# Patient Record
Sex: Male | Born: 1980 | Race: White | Hispanic: No | Marital: Married | Smoking: Former smoker
Health system: Southern US, Community
[De-identification: ages and names within clinical notes are randomized; demographics above are authoritative.]

## PROBLEM LIST (undated history)

## (undated) DIAGNOSIS — I10 Essential (primary) hypertension: Secondary | ICD-10-CM

## (undated) DIAGNOSIS — E669 Obesity, unspecified: Secondary | ICD-10-CM

## (undated) DIAGNOSIS — M1990 Unspecified osteoarthritis, unspecified site: Secondary | ICD-10-CM

## (undated) DIAGNOSIS — F419 Anxiety disorder, unspecified: Secondary | ICD-10-CM

## (undated) DIAGNOSIS — F329 Major depressive disorder, single episode, unspecified: Secondary | ICD-10-CM

## (undated) DIAGNOSIS — Z87442 Personal history of urinary calculi: Secondary | ICD-10-CM

## (undated) DIAGNOSIS — R0781 Pleurodynia: Secondary | ICD-10-CM

## (undated) DIAGNOSIS — E119 Type 2 diabetes mellitus without complications: Secondary | ICD-10-CM

## (undated) DIAGNOSIS — E785 Hyperlipidemia, unspecified: Secondary | ICD-10-CM

## (undated) DIAGNOSIS — I2510 Atherosclerotic heart disease of native coronary artery without angina pectoris: Secondary | ICD-10-CM

## (undated) DIAGNOSIS — K861 Other chronic pancreatitis: Secondary | ICD-10-CM

## (undated) DIAGNOSIS — N35919 Unspecified urethral stricture, male, unspecified site: Secondary | ICD-10-CM

## (undated) DIAGNOSIS — F32A Depression, unspecified: Secondary | ICD-10-CM

## (undated) DIAGNOSIS — E781 Pure hyperglyceridemia: Secondary | ICD-10-CM

## (undated) DIAGNOSIS — Z9189 Other specified personal risk factors, not elsewhere classified: Secondary | ICD-10-CM

## (undated) HISTORY — PX: APPENDECTOMY: SHX54

## (undated) HISTORY — PX: CARDIAC CATHETERIZATION: SHX172

## (undated) HISTORY — PX: FRACTURE SURGERY: SHX138

---
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
## 2004-08-25 ENCOUNTER — Emergency Department (HOSPITAL_COMMUNITY): Admission: EM | Admit: 2004-08-25 | Discharge: 2004-08-25 | Payer: Self-pay | Admitting: Emergency Medicine

## 2005-01-07 ENCOUNTER — Emergency Department (HOSPITAL_COMMUNITY): Admission: EM | Admit: 2005-01-07 | Discharge: 2005-01-07 | Payer: Self-pay | Admitting: Emergency Medicine

## 2006-06-14 ENCOUNTER — Emergency Department (HOSPITAL_COMMUNITY): Admission: EM | Admit: 2006-06-14 | Discharge: 2006-06-14 | Payer: Self-pay | Admitting: Family Medicine

## 2014-01-30 HISTORY — PX: URETHROPLASTY: SHX499

## 2014-07-24 ENCOUNTER — Other Ambulatory Visit: Payer: Self-pay | Admitting: Urology

## 2014-08-05 ENCOUNTER — Encounter (HOSPITAL_BASED_OUTPATIENT_CLINIC_OR_DEPARTMENT_OTHER): Payer: Self-pay | Admitting: *Deleted

## 2014-08-05 NOTE — Progress Notes (Signed)
NPO AFTER MN.  ARRIVE AT 1000.  NEEDS ISTAT AND EKG.  WILL  DO HIBICLENS SHOWER HS BEFORE DOS.

## 2014-08-05 NOTE — Progress Notes (Signed)
   08/05/14 1631  OBSTRUCTIVE SLEEP APNEA  Have you ever been diagnosed with sleep apnea through a sleep study? No  Do you snore loudly (loud enough to be heard through closed doors)?  1  Do you often feel tired, fatigued, or sleepy during the daytime? 0  Has anyone observed you stop breathing during your sleep? 1  Do you have, or are you being treated for high blood pressure? 1  BMI more than 35 kg/m2? 1  Age over 34 years old? 0  Neck circumference greater than 40 cm/16 inches? 1  Gender: 1  Obstructive Sleep Apnea Score 6

## 2014-08-09 ENCOUNTER — Encounter (HOSPITAL_BASED_OUTPATIENT_CLINIC_OR_DEPARTMENT_OTHER): Payer: Self-pay | Admitting: Anesthesiology

## 2014-08-09 NOTE — Anesthesia Preprocedure Evaluation (Addendum)
Anesthesia Evaluation  Patient identified by MRN, date of birth, ID band Patient awake    Reviewed: Allergy & Precautions, H&P , NPO status , Patient's Chart, lab work & pertinent test results, reviewed documented beta blocker date and time , Unable to perform ROS - Chart review only  History of Anesthesia Complications Negative for: history of anesthetic complications  Airway Mallampati: IV  TM Distance: >3 FB Neck ROM: Limited  Mouth opening: Limited Mouth Opening Comment: Patient is obese with very thick neck Dental no notable dental hx.    Pulmonary former smoker,  breath sounds clear to auscultation  Pulmonary exam normal       Cardiovascular hypertension, Normal cardiovascular examRhythm:regular Rate:Normal     Neuro/Psych negative neurological ROS  negative psych ROS   GI/Hepatic negative GI ROS, Neg liver ROS,   Endo/Other  negative endocrine ROS  Renal/GU negative Renal ROS     Musculoskeletal   Abdominal (+) + obese,   Peds  Hematology negative hematology ROS (+)   Anesthesia Other Findings NPO appropriate, no meds today, allergies reviewed Denies active cardiac or pulmonary symptoms, METS > 4 No recent congestive cough or symptoms of upper respiratory infection - no labs - will have video laryngoscope available in room as needed  Reproductive/Obstetrics negative OB ROS                          Anesthesia Physical Anesthesia Plan  ASA: II  Anesthesia Plan: General   Post-op Pain Management:    Induction: Intravenous  Airway Management Planned: LMA  Additional Equipment:   Intra-op Plan:   Post-operative Plan: Extubation in OR  Informed Consent: I have reviewed the patients History and Physical, chart, labs and discussed the procedure including the risks, benefits and alternatives for the proposed anesthesia with the patient or authorized representative who has  indicated his/her understanding and acceptance.   Dental Advisory Given  Plan Discussed with: Anesthesiologist  Anesthesia Plan Comments:         Anesthesia Quick Evaluation

## 2014-08-10 ENCOUNTER — Ambulatory Visit (HOSPITAL_BASED_OUTPATIENT_CLINIC_OR_DEPARTMENT_OTHER): Payer: 59 | Admitting: Anesthesiology

## 2014-08-10 ENCOUNTER — Ambulatory Visit (HOSPITAL_BASED_OUTPATIENT_CLINIC_OR_DEPARTMENT_OTHER)
Admission: RE | Admit: 2014-08-10 | Discharge: 2014-08-10 | Disposition: A | Discharge status: Home / Self Care | Payer: 59 | Source: Ambulatory Visit | Attending: Urology | Admitting: Urology

## 2014-08-10 ENCOUNTER — Encounter (HOSPITAL_BASED_OUTPATIENT_CLINIC_OR_DEPARTMENT_OTHER)
Admission: RE | Disposition: A | Discharge status: Home / Self Care | Payer: Self-pay | Source: Ambulatory Visit | Attending: Urology

## 2014-08-10 ENCOUNTER — Encounter (HOSPITAL_BASED_OUTPATIENT_CLINIC_OR_DEPARTMENT_OTHER): Payer: Self-pay | Admitting: *Deleted

## 2014-08-10 DIAGNOSIS — I10 Essential (primary) hypertension: Secondary | ICD-10-CM | POA: Diagnosis not present

## 2014-08-10 DIAGNOSIS — Z6838 Body mass index (BMI) 38.0-38.9, adult: Secondary | ICD-10-CM | POA: Insufficient documentation

## 2014-08-10 DIAGNOSIS — E669 Obesity, unspecified: Secondary | ICD-10-CM | POA: Diagnosis not present

## 2014-08-10 DIAGNOSIS — N359 Urethral stricture, unspecified: Secondary | ICD-10-CM | POA: Insufficient documentation

## 2014-08-10 DIAGNOSIS — N480 Leukoplakia of penis: Secondary | ICD-10-CM | POA: Insufficient documentation

## 2014-08-10 HISTORY — PX: CYSTOSCOPY WITH URETHRAL DILATATION: SHX5125

## 2014-08-10 HISTORY — DX: Hyperlipidemia, unspecified: E78.5

## 2014-08-10 HISTORY — PX: PENILE BIOPSY: SHX6013

## 2014-08-10 HISTORY — DX: Essential (primary) hypertension: I10

## 2014-08-10 HISTORY — DX: Other specified personal risk factors, not elsewhere classified: Z91.89

## 2014-08-10 HISTORY — DX: Unspecified osteoarthritis, unspecified site: M19.90

## 2014-08-10 HISTORY — DX: Unspecified urethral stricture, male, unspecified site: N35.919

## 2014-08-10 LAB — POCT I-STAT 4, (NA,K, GLUC, HGB,HCT)
Glucose, Bld: 159 mg/dL — ABNORMAL HIGH (ref 65–99)
HCT: 41 % (ref 39.0–52.0)
Hemoglobin: 13.9 g/dL (ref 13.0–17.0)
Potassium: 4.3 mmol/L (ref 3.5–5.1)
Sodium: 138 mmol/L (ref 135–145)

## 2014-08-10 SURGERY — CYSTOSCOPY, WITH URETHRAL DILATION
Anesthesia: General | Site: Penis

## 2014-08-10 MED ORDER — LACTATED RINGERS IV SOLN
INTRAVENOUS | Status: DC
  Administered 2014-08-10 (×2): via INTRAVENOUS
  Filled 2014-08-10: qty 1000

## 2014-08-10 MED ORDER — OXYCODONE-ACETAMINOPHEN 5-325 MG PO TABS
ORAL_TABLET | ORAL | Status: AC
  Filled 2014-08-10: qty 1

## 2014-08-10 MED ORDER — FENTANYL CITRATE (PF) 100 MCG/2ML IJ SOLN
INTRAMUSCULAR | Status: AC
  Filled 2014-08-10: qty 2

## 2014-08-10 MED ORDER — MIDAZOLAM HCL 5 MG/5ML IJ SOLN
INTRAMUSCULAR | Status: DC | PRN
  Administered 2014-08-10: 2 mg via INTRAVENOUS

## 2014-08-10 MED ORDER — CEFAZOLIN SODIUM-DEXTROSE 2-3 GM-% IV SOLR
2.0000 g | INTRAVENOUS | Status: AC
  Administered 2014-08-10: 2 g via INTRAVENOUS
  Filled 2014-08-10: qty 50

## 2014-08-10 MED ORDER — FENTANYL CITRATE (PF) 100 MCG/2ML IJ SOLN
INTRAMUSCULAR | Status: AC
  Filled 2014-08-10: qty 6

## 2014-08-10 MED ORDER — LIDOCAINE HCL (CARDIAC) 20 MG/ML IV SOLN
INTRAVENOUS | Status: DC | PRN
  Administered 2014-08-10: 100 mg via INTRAVENOUS

## 2014-08-10 MED ORDER — CEFAZOLIN SODIUM 1-5 GM-% IV SOLN
1.0000 g | INTRAVENOUS | Status: DC
  Filled 2014-08-10: qty 50

## 2014-08-10 MED ORDER — DEXAMETHASONE SODIUM PHOSPHATE 4 MG/ML IJ SOLN
INTRAMUSCULAR | Status: DC | PRN
  Administered 2014-08-10: 10 mg via INTRAVENOUS

## 2014-08-10 MED ORDER — FENTANYL CITRATE (PF) 100 MCG/2ML IJ SOLN
INTRAMUSCULAR | Status: DC | PRN
  Administered 2014-08-10: 50 ug via INTRAVENOUS
  Administered 2014-08-10: 25 ug via INTRAVENOUS
  Administered 2014-08-10: 50 ug via INTRAVENOUS
  Administered 2014-08-10: 25 ug via INTRAVENOUS
  Administered 2014-08-10: 50 ug via INTRAVENOUS

## 2014-08-10 MED ORDER — FENTANYL CITRATE (PF) 100 MCG/2ML IJ SOLN
INTRAMUSCULAR | Status: AC
  Filled 2014-08-10: qty 4

## 2014-08-10 MED ORDER — PROMETHAZINE HCL 25 MG/ML IJ SOLN
6.2500 mg | INTRAMUSCULAR | Status: DC | PRN
  Filled 2014-08-10: qty 1

## 2014-08-10 MED ORDER — OXYCODONE-ACETAMINOPHEN 5-325 MG PO TABS
1.0000 | ORAL_TABLET | ORAL | Status: AC | PRN
  Administered 2014-08-10: 1 via ORAL
  Filled 2014-08-10: qty 2

## 2014-08-10 MED ORDER — FENTANYL CITRATE (PF) 100 MCG/2ML IJ SOLN
25.0000 ug | INTRAMUSCULAR | Status: DC | PRN
  Administered 2014-08-10: 50 ug via INTRAVENOUS
  Administered 2014-08-10: 25 ug via INTRAVENOUS
  Filled 2014-08-10: qty 1

## 2014-08-10 MED ORDER — CHLORHEXIDINE GLUCONATE 4 % EX LIQD
Freq: Once | CUTANEOUS | Status: DC
  Filled 2014-08-10: qty 15

## 2014-08-10 MED ORDER — SODIUM CHLORIDE 0.9 % IR SOLN
Status: DC | PRN
  Administered 2014-08-10: 500 mL

## 2014-08-10 MED ORDER — STERILE WATER FOR IRRIGATION IR SOLN
Status: DC | PRN
  Administered 2014-08-10: 3000 mL

## 2014-08-10 MED ORDER — MIDAZOLAM HCL 2 MG/2ML IJ SOLN
INTRAMUSCULAR | Status: AC
  Filled 2014-08-10: qty 2

## 2014-08-10 MED ORDER — ONDANSETRON HCL 4 MG/2ML IJ SOLN
INTRAMUSCULAR | Status: DC | PRN
  Administered 2014-08-10: 4 mg via INTRAVENOUS

## 2014-08-10 MED ORDER — OXYCODONE-ACETAMINOPHEN 5-325 MG PO TABS: 1.0000 | ORAL_TABLET | ORAL | Status: DC | PRN

## 2014-08-10 MED ORDER — PROPOFOL 10 MG/ML IV BOLUS
INTRAVENOUS | Status: DC | PRN
  Administered 2014-08-10: 300 mg via INTRAVENOUS

## 2014-08-10 MED ORDER — CEFAZOLIN SODIUM-DEXTROSE 2-3 GM-% IV SOLR
INTRAVENOUS | Status: AC
  Filled 2014-08-10: qty 50

## 2014-08-10 SURGICAL SUPPLY — 18 items
BAG DRAIN URO-CYSTO SKYTR STRL (DRAIN) ×2 IMPLANT
BAG DRN UROCATH (DRAIN) ×1
BAG URINE DRAINAGE (UROLOGICAL SUPPLIES) ×2 IMPLANT
CANISTER SUCT LVC 12 LTR MEDI- (MISCELLANEOUS) ×2 IMPLANT
CATH SILICONE 5CC 18FR (INSTRUMENTS) ×2 IMPLANT
CLOTH BEACON ORANGE TIMEOUT ST (SAFETY) ×3 IMPLANT
GLOVE BIO SURGEON STRL SZ 6.5 (GLOVE) ×1 IMPLANT
GLOVE BIO SURGEON STRL SZ8 (GLOVE) ×3 IMPLANT
GLOVE BIO SURGEONS STRL SZ 6.5 (GLOVE) ×1
GLOVE BIOGEL PI IND STRL 6.5 (GLOVE) IMPLANT
GLOVE BIOGEL PI INDICATOR 6.5 (GLOVE) ×2
GOWN STRL REUS W/ TWL XL LVL3 (GOWN DISPOSABLE) ×1 IMPLANT
GOWN STRL REUS W/TWL XL LVL3 (GOWN DISPOSABLE) ×6
HOLDER FOLEY CATH W/STRAP (MISCELLANEOUS) ×2 IMPLANT
NS IRRIG 500ML POUR BTL (IV SOLUTION) ×2 IMPLANT
PACK CYSTO (CUSTOM PROCEDURE TRAY) ×3 IMPLANT
SUT CHROMIC 3 0 SH 27 (SUTURE) ×2 IMPLANT
WATER STERILE IRR 3000ML UROMA (IV SOLUTION) ×3 IMPLANT

## 2014-08-10 NOTE — Discharge Instructions (Signed)
CYSTOSCOPY HOME CARE INSTRUCTIONS ° °Activity: °Rest for the remainder of the day.  Do not drive or operate equipment today.  You may resume normal activities in one to two days as instructed by your physician.  ° °Meals: °Drink plenty of liquids and eat light foods such as gelatin or soup this evening.  You may return to a normal meal plan tomorrow. ° °Return to Work: °You may return to work in one to two days or as instructed by your physician. ° °Special Instructions / Symptoms: °Call your physician if any of these symptoms occur: ° ° -persistent or heavy bleeding ° -bleeding which continues after first few urination ° -large blood clots that are difficult to pass ° -urine stream diminishes or stops completely ° -fever equal to or higher than 101 degrees Farenheit. ° -cloudy urine with a strong, foul odor ° -severe pain ° °Females should always wipe from front to back after elimination.  You may feel some burning pain when you urinate.  This should disappear with time.  Applying moist heat to the lower abdomen or a hot tub bath may help relieve the pain.  ° °Follow-Up / Date of Return Visit to Your Physician: Call for an appointment to arrange follow-up. ° ° ° °Foley Catheter Care °A Foley catheter is a soft, flexible tube that is placed into the bladder to drain urine. A Foley catheter may be inserted if: °· You leak urine or are not able to control when you urinate (urinary incontinence). °· You are not able to urinate when you need to (urinary retention). °· You had prostate surgery or surgery on the genitals. °· You have certain medical conditions, such as multiple sclerosis, dementia, or a spinal cord injury. °If you are going home with a Foley catheter in place, follow the instructions below. °TAKING CARE OF THE CATHETER °1. Wash your hands with soap and water. °2. Using mild soap and warm water on a clean washcloth: °· Clean the area on your body closest to the catheter insertion site using a circular  motion, moving away from the catheter. Never wipe toward the catheter because this could sweep bacteria up into the urethra and cause infection. °· Remove all traces of soap. Pat the area dry with a clean towel. For males, reposition the foreskin. °3. Attach the catheter to your leg so there is no tension on the catheter. Use adhesive tape or a leg strap. If you are using adhesive tape, remove any sticky residue left behind by the previous tape you used. °4. Keep the drainage bag below the level of the bladder, but keep it off the floor. °5. Check throughout the day to be sure the catheter is working and urine is draining freely. Make sure the tubing does not become kinked. °6. Do not pull on the catheter or try to remove it. Pulling could damage internal tissues. °TAKING CARE OF THE DRAINAGE BAGS °You will be given two drainage bags to take home. One is a large overnight drainage bag, and the other is a smaller leg bag that fits underneath clothing. You may wear the overnight bag at any time, but you should never wear the smaller leg bag at night. Follow the instructions below for how to empty, change, and clean your drainage bags. °Emptying the Drainage Bag °You must empty your drainage bag when it is  -½ full or at least 2-3 times a day. °1. Wash your hands with soap and water. °2. Keep the drainage bag below   your hips, below the level of your bladder. This stops urine from going back into the tubing and into your bladder. °3. Hold the dirty bag over the toilet or a clean container. °4. Open the pour spout at the bottom of the bag and empty the urine into the toilet or container. Do not let the pour spout touch the toilet, container, or any other surface. Doing so can place bacteria on the bag, which can cause an infection. °5. Clean the pour spout with a gauze pad or cotton ball that has rubbing alcohol on it. °6. Close the pour spout. °7. Attach the bag to your leg with adhesive tape or a leg strap. °8. Wash  your hands well. °Changing the Drainage Bag °Change your drainage bag once a month or sooner if it starts to smell bad or look dirty. Below are steps to follow when changing the drainage bag. °1. Wash your hands with soap and water. °2. Pinch off the rubber catheter so that urine does not spill out. °3. Disconnect the catheter tube from the drainage tube at the connection valve. Do not let the tubes touch any surface. °4. Clean the end of the catheter tube with an alcohol wipe. Use a different alcohol wipe to clean the end of the drainage tube. °5. Connect the catheter tube to the drainage tube of the clean drainage bag. °6. Attach the new bag to the leg with adhesive tape or a leg strap. Avoid attaching the new bag too tightly. °7. Wash your hands well. °Cleaning the Drainage Bag °1. Wash your hands with soap and water. °2. Wash the bag in warm, soapy water. °3. Rinse the bag thoroughly with warm water. °4. Fill the bag with a solution of white vinegar and water (1 cup vinegar to 1 qt warm water [.2 L vinegar to 1 L warm water]). Close the bag and soak it for 30 minutes in the solution. °5. Rinse the bag with warm water. °6. Hang the bag to dry with the pour spout open and hanging downward. °7. Store the clean bag (once it is dry) in a clean plastic bag. °8. Wash your hands well. °PREVENTING INFECTION °· Wash your hands before and after handling your catheter. °· Take showers daily and wash the area where the catheter enters your body. Do not take baths. Replace wet leg straps with dry ones, if this applies. °· Do not use powders, sprays, or lotions on the genital area. Only use creams, lotions, or ointments as directed by your caregiver. °· For females, wipe from front to back after each bowel movement. °· Drink enough fluids to keep your urine clear or pale yellow unless you have a fluid restriction. °· Do not let the drainage bag or tubing touch or lie on the floor. °· Wear cotton underwear to absorb moisture  and to keep your skin drier. °SEEK MEDICAL CARE IF:  °· Your urine is cloudy or smells unusually bad. °· Your catheter becomes clogged. °· You are not draining urine into the bag or your bladder feels full. °· Your catheter starts to leak. °SEEK IMMEDIATE MEDICAL CARE IF:  °· You have pain, swelling, redness, or pus where the catheter enters the body. °· You have pain in the abdomen, legs, lower back, or bladder. °· You have a fever. °· You see blood fill the catheter, or your urine is pink or red. °· You have nausea, vomiting, or chills. °· Your catheter gets pulled out. °MAKE SURE YOU:  °·   Understand these instructions. °· Will watch your condition. °· Will get help right away if you are not doing well or get worse. °Document Released: 01/16/2005 Document Revised: 06/02/2013 Document Reviewed: 01/08/2012 °[employer]® Patient Information ©2015 [employer]. This information is not intended to replace advice given to you by your health care provider. Make sure you discuss any questions you have with your health care provider. ° ° ° ° ° °Post Anesthesia Home Care Instructions ° °Activity: °Get plenty of rest for the remainder of the day. A responsible adult should stay with you for 24 hours following the procedure.  °For the next 24 hours, DO NOT: °-Drive a car °-Operate machinery °-Drink alcoholic beverages °-Take any medication unless instructed by your physician °-Make any legal decisions or sign important papers. ° °Meals: °Start with liquid foods such as gelatin or soup. Progress to regular foods as tolerated. Avoid greasy, spicy, heavy foods. If nausea and/or vomiting occur, drink only clear liquids until the nausea and/or vomiting subsides. Call your physician if vomiting continues. ° °Special Instructions/Symptoms: °Your throat may feel dry or sore from the anesthesia or the breathing tube placed in your throat during surgery. If this causes discomfort, gargle with warm salt water. The discomfort should  disappear within 24 hours. ° °If you had a scopolamine patch placed behind your ear for the management of post- operative nausea and/or vomiting: ° °1. The medication in the patch is effective for 72 hours, after which it should be removed.  Wrap patch in a tissue and discard in the trash. Wash hands thoroughly with soap and water. °2. You may remove the patch earlier than 72 hours if you experience unpleasant side effects which may include dry mouth, dizziness or visual disturbances. °3. Avoid touching the patch. Wash your hands with soap and water after contact with the patch. °  ° °

## 2014-08-10 NOTE — Brief Op Note (Signed)
08/10/2014  12:05 PM  PATIENT:  Andre RuizJohn T Ford  34 y.o. male  PRE-OPERATIVE DIAGNOSIS:  URETHRAL STRICTURE  POST-OPERATIVE DIAGNOSIS:  URETHRAL STRICTURE  PROCEDURE:  Procedure(s) with comments: CYSTOSCOPY WITH URETHRAL DILATATION (N/A) - **CATHETER PLACEMENT**  Glans Punch biopsy  SURGEON:  Surgeon(s) and Role:    * Malen GauzePatrick L Anaysha Andre, MD - Primary  PHYSICIAN ASSISTANT:   ASSISTANTS: none   ANESTHESIA:   general  EBL:  Total I/O In: 1000 [I.V.:1000] Out: -   BLOOD ADMINISTERED:none  DRAINS: Urinary Catheter (Foley)   LOCAL MEDICATIONS USED:  NONE  SPECIMEN:  Glans Biopsy  DISPOSITION OF SPECIMEN:  PATHOLOGY  COUNTS:  YES  TOURNIQUET:  * No tourniquets in log *  DICTATION: .Note written in EPIC  PLAN OF CARE: Discharge to home after PACU  PATIENT DISPOSITION:  PACU - hemodynamically stable.   Delay start of Pharmacological VTE agent (>24hrs) due to surgical blood loss or risk of bleeding: not applicable

## 2014-08-10 NOTE — Anesthesia Procedure Notes (Signed)
Procedure Name: LMA Insertion Date/Time: 08/10/2014 11:37 AM Performed by: Renella CunasHAZEL, Mistee Soliman D Pre-anesthesia Checklist: Patient identified, Emergency Drugs available, Suction available and Patient being monitored Patient Re-evaluated:Patient Re-evaluated prior to inductionOxygen Delivery Method: Circle System Utilized Preoxygenation: Pre-oxygenation with 100% oxygen Intubation Type: IV induction Ventilation: Mask ventilation without difficulty LMA: LMA inserted LMA Size: 4.0 Number of attempts: 1 Airway Equipment and Method: Bite block Placement Confirmation: positive ETCO2 Tube secured with: Tape Dental Injury: Teeth and Oropharynx as per pre-operative assessment

## 2014-08-10 NOTE — Transfer of Care (Signed)
Immediate Anesthesia Transfer of Care Note  Patient: Andre Ford  Procedure(s) Performed: Procedure(s) (LRB): CYSTOSCOPY WITH URETHRAL DILATATION (N/A)  Patient Location: PACU  Anesthesia Type: General  Level of Consciousness: awake, oriented, sedated and patient cooperative  Airway & Oxygen Therapy: Patient Spontanous Breathing and Patient connected to face mask oxygen  Post-op Assessment: Report given to PACU RN and Post -op Vital signs reviewed and stable  Post vital signs: Reviewed and stable  Complications: No apparent anesthesia complications

## 2014-08-11 ENCOUNTER — Encounter (HOSPITAL_BASED_OUTPATIENT_CLINIC_OR_DEPARTMENT_OTHER): Payer: Self-pay | Admitting: Urology

## 2014-08-11 NOTE — Anesthesia Postprocedure Evaluation (Signed)
  Anesthesia Post-op Note  Patient: Andre Ford  Procedure(s) Performed: Procedure(s) (LRB): CYSTOSCOPY WITH URETHRAL DILATATION (N/A) Core biopsy of penile glands lesion (N/A)  Patient Location: PACU  Anesthesia Type: General  Level of Consciousness: awake and alert   Airway and Oxygen Therapy: Patient Spontanous Breathing  Post-op Pain: mild  Post-op Assessment: Post-op Vital signs reviewed, Patient's Cardiovascular Status Stable, Respiratory Function Stable, Patent Airway and No signs of Nausea or vomiting  Last Vitals:  Filed Vitals:   08/10/14 1348  BP: 123/73  Pulse: 69  Temp: 36.7 C  Resp: 16    Post-op Vital Signs: stable   Complications: No apparent anesthesia complications

## 2014-08-12 NOTE — H&P (Signed)
Urology Admission H&P  Chief Complaint: dysuria  History of Present Illness: Mr Blanche EastHathcock is a 34yo with a hx of significant LUTS who was diagnosed with a urethral stricture. He presents today for urethral dilation and possible biopsy  Past Medical History  Diagnosis Date  . Urethral stricture   . Hypertension   . Hyperlipidemia   . At risk for sleep apnea     STOP-BANG= 6     SENT TO [facility] 08-05-2014  . Arthritis    Past Surgical History  Procedure Laterality Date  . Appendectomy  age 89  . Orif right hand fx  2000  . Knee arthroscopy with anterior cruciate ligament (acl) repair Left 2000  . Cystoscopy with urethral dilatation N/A 08/10/2014    Procedure: CYSTOSCOPY WITH URETHRAL DILATATION;  Surgeon: Malen GauzePatrick L Lajuan Godbee, MD;  [facility] [place];  Service: Urology;  Laterality: N/A;  . Penile biopsy N/A 08/10/2014    Procedure: Core biopsy of penile glands lesion;  Surgeon: Malen GauzePatrick L Keyetta Hollingworth, MD;  [facility] [place];  Service: Urology;  Laterality: N/A;    Home Medications:  No prescriptions prior to admission   Allergies: No Known Allergies  History reviewed. No pertinent family history. Social History:  reports that he quit smoking about 3 years ago. His smoking use included Cigarettes. He quit after 10 years of use. He has never used smokeless tobacco. He reports that he drinks alcohol. He reports that he does not use illicit drugs.  Review of Systems  Genitourinary: Positive for dysuria, urgency and frequency.  All other systems reviewed and are negative.   Physical Exam:  Vital signs in last 24 hours:   Physical Exam  Constitutional: He is oriented to person, place, and time. He appears well-developed and well-nourished.  HENT:  Head: Normocephalic and atraumatic.  Eyes: EOM are normal. Pupils are equal, round, and reactive to light.  Neck: Normal range of motion. Neck supple.  Cardiovascular: Normal rate and regular rhythm.    Respiratory: Effort normal and breath sounds normal.  GI: Soft. He exhibits no distension. There is no tenderness.  Musculoskeletal: Normal range of motion.  Neurological: He is alert and oriented to person, place, and time.  Skin: Skin is warm and dry.  Psychiatric: He has a normal mood and affect. His behavior is normal. Judgment and thought content normal.    Laboratory Data:  No results found for this or any previous visit (from the past 24 hour(s)). No results found for this or any previous visit (from the past 240 hour(s)). Creatinine: No results for input(s): CREATININE in the last 168 hours.   Impression/Assessment:  Urethral stricture  Plan:  1. Risks/benefits/alternatives to cystoscopy with urethral dilation and biopsy was explained to the patient and he understands and wishes to proceed with surgery  Zeola Brys L 08/12/2014, 11:53 AM

## 2014-08-13 NOTE — Op Note (Signed)
Preoperative diagnosis: urethral stricture  Postoperative diagnosis: Same  Procedure: 1. Cystoscopy 2. urethral dilation  3. Biopsy of glans  Attending: Wilkie AyePatrick Paz Winsett  Anesthesia: General  History of blood loss: Minimal  Antibiotics: Ancef  Specimens: Glans biopsy  Drains: 18 French silicone catheter  Findings: dense urethral stricture involving entire penile and bulbar urethra.  No masses/lesions in the bladder.  Indications: Patient is a 34 year old male with a history of difficulty with urination and was found to have a urethral stricture and a lesion of the glans. After discussing treatment options the patient wishes to proceed with urethral dilation and biopsy of the glans.  Procedure in detail: Prior to procedure consent was obtained.  Patient was brought to the operating room and a brief timeout was done to ensure correct patient, correct procedure, correct site.  General anesthesia was administered and patient was placed in dorsal lithotomy position.  His genitalia was then prepped and draped in usual sterile fashion. Using the Hagar dilators the urethra was dilated to 20 french. We then passed a 17 French cystoscope into the urethra and then the bladder. We noted a panurethral stricture to the bulbar urethra.  We inspected the bladder and no lesions, masses, or calculi were noted. We then removed the cystoscope and placed an 18 French silicone catheter.  We then proceeded to obtain a punch biopsy of the glans due to the concern for malignancy. After obtaining the biopsy the defect was then closed with an interupted 3-0 chromic.This then concluded the procedure which was well tolerated by the patient.  Complications: None  Condition: Stable, extubated, transferred to PACU.  Plan: Patient is to be discharged home.  He is to follow up in 1 week for voiding trial.

## 2014-10-12 ENCOUNTER — Emergency Department (HOSPITAL_COMMUNITY): Payer: 59

## 2014-10-12 ENCOUNTER — Inpatient Hospital Stay (HOSPITAL_COMMUNITY)
Admission: EM | Admit: 2014-10-12 | Discharge: 2014-10-14 | DRG: 440 | Disposition: A | Discharge status: Home / Self Care | Payer: 59 | Attending: Internal Medicine | Admitting: Internal Medicine

## 2014-10-12 ENCOUNTER — Encounter (HOSPITAL_COMMUNITY): Payer: Self-pay | Admitting: *Deleted

## 2014-10-12 DIAGNOSIS — E782 Mixed hyperlipidemia: Secondary | ICD-10-CM

## 2014-10-12 DIAGNOSIS — Z87891 Personal history of nicotine dependence: Secondary | ICD-10-CM

## 2014-10-12 DIAGNOSIS — E1165 Type 2 diabetes mellitus with hyperglycemia: Secondary | ICD-10-CM | POA: Diagnosis present

## 2014-10-12 DIAGNOSIS — K859 Acute pancreatitis without necrosis or infection, unspecified: Secondary | ICD-10-CM | POA: Insufficient documentation

## 2014-10-12 DIAGNOSIS — I10 Essential (primary) hypertension: Secondary | ICD-10-CM | POA: Diagnosis present

## 2014-10-12 DIAGNOSIS — E876 Hypokalemia: Secondary | ICD-10-CM | POA: Diagnosis present

## 2014-10-12 DIAGNOSIS — E781 Pure hyperglyceridemia: Secondary | ICD-10-CM | POA: Diagnosis present

## 2014-10-12 DIAGNOSIS — F1010 Alcohol abuse, uncomplicated: Secondary | ICD-10-CM

## 2014-10-12 DIAGNOSIS — R1013 Epigastric pain: Secondary | ICD-10-CM | POA: Diagnosis present

## 2014-10-12 DIAGNOSIS — K858 Other acute pancreatitis: Secondary | ICD-10-CM | POA: Diagnosis not present

## 2014-10-12 DIAGNOSIS — Z6839 Body mass index (BMI) 39.0-39.9, adult: Secondary | ICD-10-CM

## 2014-10-12 DIAGNOSIS — E669 Obesity, unspecified: Secondary | ICD-10-CM

## 2014-10-12 DIAGNOSIS — K760 Fatty (change of) liver, not elsewhere classified: Secondary | ICD-10-CM | POA: Diagnosis present

## 2014-10-12 DIAGNOSIS — K850 Idiopathic acute pancreatitis: Secondary | ICD-10-CM | POA: Diagnosis not present

## 2014-10-12 DIAGNOSIS — K852 Alcohol induced acute pancreatitis: Principal | ICD-10-CM | POA: Diagnosis present

## 2014-10-12 HISTORY — DX: Obesity, unspecified: E66.9

## 2014-10-12 LAB — URINALYSIS, ROUTINE W REFLEX MICROSCOPIC
Glucose, UA: 500 mg/dL — AB
KETONES UR: 15 mg/dL — AB
Nitrite: NEGATIVE
Protein, ur: 100 mg/dL — AB
SPECIFIC GRAVITY, URINE: 1.039 — AB (ref 1.005–1.030)
UROBILINOGEN UA: 0.2 mg/dL (ref 0.0–1.0)
pH: 5 (ref 5.0–8.0)

## 2014-10-12 LAB — COMPREHENSIVE METABOLIC PANEL
ALBUMIN: 4.4 g/dL (ref 3.5–5.0)
ALT: 53 U/L (ref 17–63)
AST: 36 U/L (ref 15–41)
Alkaline Phosphatase: 107 U/L (ref 38–126)
Anion gap: 12 (ref 5–15)
BUN: 11 mg/dL (ref 6–20)
CO2: 24 mmol/L (ref 22–32)
CREATININE: 0.71 mg/dL (ref 0.61–1.24)
Calcium: 9.9 mg/dL (ref 8.9–10.3)
Chloride: 98 mmol/L — ABNORMAL LOW (ref 101–111)
GFR calc Af Amer: >60 mL/min (ref >60)
GFR calc non Af Amer: >60 mL/min (ref >60)
GLUCOSE: 204 mg/dL — AB (ref 65–99)
POTASSIUM: 3.7 mmol/L (ref 3.5–5.1)
Sodium: 134 mmol/L — ABNORMAL LOW (ref 135–145)
Total Bilirubin: 2.4 mg/dL — ABNORMAL HIGH (ref 0.3–1.2)
Total Protein: 7.6 g/dL (ref 6.5–8.1)

## 2014-10-12 LAB — CBC
HEMATOCRIT: 46.5 % (ref 39.0–52.0)
Hemoglobin: 16.9 g/dL (ref 13.0–17.0)
MCH: 30.7 pg (ref 26.0–34.0)
MCHC: 36.3 g/dL — AB (ref 30.0–36.0)
MCV: 84.5 fL (ref 78.0–100.0)
PLATELETS: 150 10*3/uL (ref 150–400)
RBC: 5.5 MIL/uL (ref 4.22–5.81)
RDW: 13.2 % (ref 11.5–15.5)
WBC: 12 10*3/uL — AB (ref 4.0–10.5)

## 2014-10-12 LAB — URINE MICROSCOPIC-ADD ON

## 2014-10-12 LAB — LIPASE, BLOOD: Lipase: 120 U/L — ABNORMAL HIGH (ref 22–51)

## 2014-10-12 MED ORDER — OXYCODONE-ACETAMINOPHEN 5-325 MG PO TABS
ORAL_TABLET | ORAL | Status: AC
Start: 2014-10-12 — End: 2014-10-13
  Filled 2014-10-12: qty 1

## 2014-10-12 MED ORDER — ONDANSETRON HCL 4 MG/2ML IJ SOLN
4.0000 mg | Freq: Once | INTRAMUSCULAR | Status: AC
  Administered 2014-10-12: 4 mg via INTRAVENOUS
  Filled 2014-10-12: qty 2

## 2014-10-12 MED ORDER — SODIUM CHLORIDE 0.9 % IV SOLN
1000.0000 mL | INTRAVENOUS | Status: DC
  Administered 2014-10-13: 1000 mL via INTRAVENOUS

## 2014-10-12 MED ORDER — SODIUM CHLORIDE 0.9 % IV SOLN
1000.0000 mL | Freq: Once | INTRAVENOUS | Status: AC
  Administered 2014-10-13: 1000 mL via INTRAVENOUS

## 2014-10-12 MED ORDER — OXYCODONE-ACETAMINOPHEN 5-325 MG PO TABS
1.0000 | ORAL_TABLET | Freq: Once | ORAL | Status: AC
  Administered 2014-10-12: 1 via ORAL

## 2014-10-12 MED ORDER — MORPHINE SULFATE (PF) 4 MG/ML IV SOLN
4.0000 mg | Freq: Once | INTRAVENOUS | Status: AC
  Administered 2014-10-12: 4 mg via INTRAVENOUS
  Filled 2014-10-12: qty 1

## 2014-10-12 NOTE — H&P (Signed)
[facility] History and Physical  Andre Ford ZOX:[id number] DOB: Jun 22, 1980 DOA: 10/12/2014  Referring physician: er PCP: Egbert Garibaldi, NP   Chief Complaint: abd pain  HPI: Andre Ford is a 34 y.o. male  With PMHX of urethral stricture, HLD, HTN comes in with epigastric pain radiating to his back that comes in waves and started yesterday.  No nausea, no vomiting.  Recent weekend with binge drinking 5-6 beers per day with some white liquor.  No fever, no chills.  Saw PCP who sent to ER for pancreatitis evaluation.    In the ER, lipase was elevated.  U/S of abd not ideal due to obesity but no gallstones.  Patient relays a history of elevated triglycerides. Blood sugars > 200 with no h/o DM.  Hospitalist were asked to admit for further work up.    Review of Systems:  All systems reviewed, negative unless stated above    Past Medical History  Diagnosis Date  . Urethral stricture   . Hypertension   . Hyperlipidemia   . At risk for sleep apnea     STOP-BANG= 6     SENT TO PCP 08-05-2014  . Arthritis   . Obesity    Past Surgical History  Procedure Laterality Date  . Appendectomy  age 61  . Orif right hand fx  2000  . Knee arthroscopy with anterior cruciate ligament (acl) repair Left 2000  . Cystoscopy with urethral dilatation N/A 08/10/2014    Procedure: CYSTOSCOPY WITH URETHRAL DILATATION;  Surgeon: Malen Gauze, MD;  Location: [facility];  Service: Urology;  Laterality: N/A;  . Penile biopsy N/A 08/10/2014    Procedure: Core biopsy of penile glands lesion;  Surgeon: Malen Gauze, MD;  Location: [facility];  Service: Urology;  Laterality: N/A;   Social History:  reports that he quit smoking about 3 years ago. His smoking use included Cigarettes. He quit after 10 years of use. He has never used smokeless tobacco. He reports that he drinks alcohol. He reports that he does not use illicit drugs.  No Known  Allergies  Family Hx: mother- healthy, no CAD  Prior to Admission medications   Medication Sig Start Date End Date Taking? Authorizing Provider  oxyCODONE-acetaminophen (ROXICET) 5-325 MG per tablet Take 1 tablet by mouth every 4 (four) hours as needed for severe pain. 08/10/14   Malen Gauze, MD   Physical Exam: Filed Vitals:   10/12/14 2100 10/12/14 2130 10/12/14 2215 10/12/14 2330  BP: 131/74 117/81 121/71 124/62  Pulse: 105 100 104 99  Temp:      TempSrc:      Resp: Height:      Weight:      SpO2: 100% 100% 98% 98%    Wt Readings from Last 3 Encounters:  10/12/14 127.007 kg (280 lb)  08/10/14 127.149 kg (280 lb 5 oz)    General:  Appears calm and comfortable Eyes: PERRL, normal lids, irises & conjunctiva ENT: grossly normal hearing, lips & tongue Neck: no LAD, masses or thyromegaly Cardiovascular: RRR, no m/r/g. No LE edema. Telemetry: SR, no arrhythmias  Respiratory: CTA bilaterally, no w/r/r. Normal respiratory effort. Abdomen: soft, tender in epigastric area Skin: multiple tatooes Musculoskeletal: grossly normal tone BUE/BLE Psychiatric: grossly normal mood and affect, speech fluent and appropriate Neurologic: grossly non-focal.          Labs on Admission:  Basic Metabolic Panel:  Recent Labs Lab 10/12/14 1711  NA 134*  K 3.7  CL 98*  CO2 24  GLUCOSE 204*  BUN 11  CREATININE 0.71  CALCIUM 9.9   Liver Function Tests:  Recent Labs Lab 10/12/14 1711  AST 36  ALT 53  ALKPHOS 107  BILITOT 2.4*  PROT 7.6  ALBUMIN 4.4    Recent Labs Lab 10/12/14 1711  LIPASE 120*   No results for input(s): AMMONIA in the last 168 hours. CBC:  Recent Labs Lab 10/12/14 1711  WBC 12.0*  HGB 16.9  HCT 46.5  MCV 84.5  PLT 150   Cardiac Enzymes: No results for input(s): CKTOTAL, CKMB, CKMBINDEX, TROPONINI in the last 168 hours.  BNP (last 3 results) No results for input(s): BNP in the last 8760 hours.  ProBNP (last 3 results) No  results for input(s): PROBNP in the last 8760 hours.  CBG: No results for input(s): GLUCAP in the last 168 hours.  Radiological Exams on Admission: US Abdomen Complete  10/12/2014   CLINICAL DATA:  Right upper quadrant pain. Elevated lipase. Severe pain radiating from front to back for 24 hours.  EXAM: ULTRASOUND ABDOMEN COMPLETE  COMPARISON:  None.  FINDINGS: Gallbladder: Physiologically distended. No gallstones or wall thickening visualized. No sonographic Murphy sign noted.  Common bile duct: Diameter: 3 mm  Liver: Diffusely increased in parenchymal echogenicity. Liver parenchyma is difficult to penetrate and suboptimally evaluated. No gross focal lesion identified.  IVC: Not well visualized due to body habitus.  Pancreas: Not visualized due to body habitus.  Spleen: Grossly normal in size.  Right Kidney: Length: 12.2 cm. Echogenicity within normal limits. No mass or hydronephrosis visualized.  Left Kidney: Length: 13.5 cm. Echogenicity within normal limits. No mass or hydronephrosis visualized.  Abdominal aorta: No aneurysm visualized. Majority of the abdominal aorta is obscured and not well visualized.  Other findings: No ascites. Technically limited exam due to body habitus.  IMPRESSION: 1. Hepatic steatosis. Liver parenchyma is difficult to penetrate and suboptimally evaluated. 2. Normal sonographic appearance of the gallbladder. 3. Midline structures including the pancreas are not visualized.   Electronically Signed   By: Rubye Oaks M.D.   On: 10/12/2014 23:02      Assessment/Plan Active Problems:   Pancreatitis   Hepatic steatosis   Alcohol abuse, episodic   Obesity hyperglycemia   Abdominal pain due to pancreatitis -suspect due to hypertriglyceridemia vs alcohol use -U/S did not show stones -IVF -IV pain control -NPO  Hepatic steatosis -encourage weight loss  Obesity -encourage weight loss  Hyperglycemia -check HgbA1C -SSI q 4 hours  Code Status: full DVT  Prophylaxis: Family Communication: family at bedside Disposition Plan:   Time spent: 65 min  Marlin Canary [facility] Pager [phone]

## 2014-10-12 NOTE — ED Provider Notes (Signed)
CSN: [id number]     Arrival date & time 10/12/14  1602 History   First MD Initiated Contact with Patient 10/12/14 2030     Chief Complaint  Patient presents with  . Abdominal Pain     (Consider location/radiation/quality/duration/timing/severity/associated sxs/prior Treatment) HPI Gradual onset 3pm yesterday. Waves of severe pain. Radiation to back. No vomiting. Frequent burping. Pain similar to appendix pain but up higher. No fever at home, sweating.  Saw PCP thought may be pancreatitis. No H/o pancreatitis. Rare etoh. This weekend did drink more usual, 4 beers a day for couple days. Got severe after eating at [employer] yesterday. Past Medical History  Diagnosis Date  . Urethral stricture   . Hypertension   . Hyperlipidemia   . At risk for sleep apnea     STOP-BANG= 6     SENT TO PCP 08-05-2014  . Arthritis   . Obesity    Past Surgical History  Procedure Laterality Date  . Appendectomy  age 64  . Orif right hand fx  2000  . Knee arthroscopy with anterior cruciate ligament (acl) repair Left 2000  . Cystoscopy with urethral dilatation N/A 08/10/2014    Procedure: CYSTOSCOPY WITH URETHRAL DILATATION;  Surgeon: Malen Gauze, MD;  Location: [facility];  Service: Urology;  Laterality: N/A;  . Penile biopsy N/A 08/10/2014    Procedure: Core biopsy of penile glands lesion;  Surgeon: Malen Gauze, MD;  Location: [facility];  Service: Urology;  Laterality: N/A;   History reviewed. No pertinent family history. Social History  Substance Use Topics  . Smoking status: Former Smoker -- 10 years    Types: Cigarettes    Quit date: 08/05/2011  . Smokeless tobacco: Never Used  . Alcohol Use: Yes     Comment: occasional    Review of Systems  10 Systems reviewed and are negative for acute change except as noted in the HPI.  Allergies  Review of patient's allergies indicates no known allergies.  Home Medications   Prior to Admission  medications   Medication Sig Start Date End Date Taking? Authorizing Provider  fenofibrate 160 MG tablet Take 1 tablet (160 mg total) by mouth daily. 10/14/14   Catarina Hartshorn, MD  gemfibrozil (LOPID) 600 MG tablet Take 1 tablet (600 mg total) by mouth daily. 10/14/14   Catarina Hartshorn, MD  metFORMIN (GLUCOPHAGE) 500 MG tablet Take 1 tablet (500 mg total) by mouth 2 (two) times daily with a meal. 10/14/14   Catarina Hartshorn, MD   BP 110/68 mmHg  Pulse 80  Temp(Src) 98.4 F (36.9 C) (Oral)  Resp 18  Ht 5\' 11"  (1.803 m)  Wt 280 lb 6.8 oz (127.2 kg)  BMI 39.13 kg/m2  SpO2 99% Physical Exam  Constitutional: He is oriented to person, place, and time. He appears well-developed and well-nourished.  HENT:  Head: Normocephalic and atraumatic.  Eyes: EOM are normal. Pupils are equal, round, and reactive to light.  Neck: Neck supple.  Cardiovascular: Normal rate, regular rhythm, normal heart sounds and intact distal pulses.   Pulmonary/Chest: Effort normal and breath sounds normal.  Abdominal: Soft. Bowel sounds are normal. He exhibits no distension. There is tenderness.  Sv ruq tender to palpation  Musculoskeletal: Normal range of motion. He exhibits no edema.  Neurological: He is alert and oriented to person, place, and time. He has normal strength. Coordination normal. GCS eye subscore is 4. GCS verbal subscore is 5. GCS motor subscore is 6.  Skin: Skin  is warm, dry and intact.  Psychiatric: He has a normal mood and affect.    ED Course  Procedures (including critical care time) Labs Review Labs Reviewed  LIPASE, BLOOD - Abnormal; Notable for the following:    Lipase 120 (*)    All other components within normal limits  COMPREHENSIVE METABOLIC PANEL - Abnormal; Notable for the following:    Sodium 134 (*)    Chloride 98 (*)    Glucose, Bld 204 (*)    Total Bilirubin 2.4 (*)    All other components within normal limits  CBC - Abnormal; Notable for the following:    WBC 12.0 (*)    MCHC 36.3 (*)     All other components within normal limits  URINALYSIS, ROUTINE W REFLEX MICROSCOPIC (NOT AT [facility]) - Abnormal; Notable for the following:    Color, Urine AMBER (*)    APPearance TURBID (*)    Specific Gravity, Urine 1.039 (*)    Glucose, UA 500 (*)    Hgb urine dipstick SMALL (*)    Bilirubin Urine SMALL (*)    Ketones, ur 15 (*)    Protein, ur 100 (*)    Leukocytes, UA TRACE (*)    All other components within normal limits  URINE MICROSCOPIC-ADD ON - Abnormal; Notable for the following:    Squamous Epithelial / LPF MANY (*)    All other components within normal limits  CBC - Abnormal; Notable for the following:    Platelets 138 (*)    All other components within normal limits  COMPREHENSIVE METABOLIC PANEL - Abnormal; Notable for the following:    Sodium 134 (*)    Potassium 3.3 (*)    Chloride 99 (*)    Glucose, Bld 198 (*)    Total Bilirubin 1.7 (*)    All other components within normal limits  HEMOGLOBIN A1C - Abnormal; Notable for the following:    Hgb A1c MFr Bld 8.0 (*)    All other components within normal limits  LIPID PANEL - Abnormal; Notable for the following:    Cholesterol 232 (*)    Triglycerides 837 (*)    HDL 26 (*)    All other components within normal limits  GLUCOSE, CAPILLARY - Abnormal; Notable for the following:    Glucose-Capillary 184 (*)    All other components within normal limits  GLUCOSE, CAPILLARY - Abnormal; Notable for the following:    Glucose-Capillary 186 (*)    All other components within normal limits  GLUCOSE, CAPILLARY - Abnormal; Notable for the following:    Glucose-Capillary 172 (*)    All other components within normal limits  COMPREHENSIVE METABOLIC PANEL - Abnormal; Notable for the following:    Glucose, Bld 155 (*)    Albumin 3.2 (*)    Total Bilirubin 1.3 (*)    All other components within normal limits  LIPASE, BLOOD - Abnormal; Notable for the following:    Lipase 62 (*)    All other components within normal limits   GLUCOSE, CAPILLARY - Abnormal; Notable for the following:    Glucose-Capillary 149 (*)    All other components within normal limits  GLUCOSE, CAPILLARY - Abnormal; Notable for the following:    Glucose-Capillary 156 (*)    All other components within normal limits  GLUCOSE, CAPILLARY - Abnormal; Notable for the following:    Glucose-Capillary 197 (*)    All other components within normal limits  GLUCOSE, CAPILLARY - Abnormal; Notable for the following:  Glucose-Capillary 138 (*)    All other components within normal limits  CBG MONITORING, ED - Abnormal; Notable for the following:    Glucose-Capillary 184 (*)    All other components within normal limits  MAGNESIUM    Imaging Review No results found. I have personally reviewed and evaluated these images and lab results as part of my medical decision-making.   EKG Interpretation None      MDM   Final diagnoses:  Acute pancreatitis, unspecified pancreatitis type   Patient presents with epigastric pain. He was advised by PCP to present to the emergency department for possible pancreatitis. At this time the patient does have elevated pancreatic enzymes, ultrasound does not show gallbladder abnormality but has not had good visualization of the pancreas. At this point and the patient will be admitted for ongoing hydration and pain control with further diagnostic evaluation is needed.    Arby Barrette, MD 10/17/14 2234

## 2014-10-12 NOTE — ED Notes (Signed)
Pt reports severe abd pain that radiates into his back. Denies n/v. Went to pcp and sent here for possible pancreatitis.

## 2014-10-13 ENCOUNTER — Encounter (HOSPITAL_COMMUNITY): Payer: Self-pay | Admitting: *Deleted

## 2014-10-13 DIAGNOSIS — K850 Idiopathic acute pancreatitis: Secondary | ICD-10-CM

## 2014-10-13 LAB — CBG MONITORING, ED: Glucose-Capillary: 184 mg/dL — ABNORMAL HIGH (ref 65–99)

## 2014-10-13 LAB — COMPREHENSIVE METABOLIC PANEL
ALT: 37 U/L (ref 17–63)
AST: 22 U/L (ref 15–41)
Albumin: 3.5 g/dL (ref 3.5–5.0)
Alkaline Phosphatase: 88 U/L (ref 38–126)
Anion gap: 10 (ref 5–15)
BUN: 12 mg/dL (ref 6–20)
CHLORIDE: 99 mmol/L — AB (ref 101–111)
CO2: 25 mmol/L (ref 22–32)
CREATININE: 0.76 mg/dL (ref 0.61–1.24)
Calcium: 9 mg/dL (ref 8.9–10.3)
GFR calc non Af Amer: >60 mL/min (ref >60)
Glucose, Bld: 198 mg/dL — ABNORMAL HIGH (ref 65–99)
POTASSIUM: 3.3 mmol/L — AB (ref 3.5–5.1)
SODIUM: 134 mmol/L — AB (ref 135–145)
Total Bilirubin: 1.7 mg/dL — ABNORMAL HIGH (ref 0.3–1.2)
Total Protein: 6.9 g/dL (ref 6.5–8.1)

## 2014-10-13 LAB — CBC
HCT: 39.7 % (ref 39.0–52.0)
Hemoglobin: 13.8 g/dL (ref 13.0–17.0)
MCH: 29.7 pg (ref 26.0–34.0)
MCHC: 34.8 g/dL (ref 30.0–36.0)
MCV: 85.6 fL (ref 78.0–100.0)
PLATELETS: 138 10*3/uL — AB (ref 150–400)
RBC: 4.64 MIL/uL (ref 4.22–5.81)
RDW: 13.6 % (ref 11.5–15.5)
WBC: 9.1 10*3/uL (ref 4.0–10.5)

## 2014-10-13 LAB — GLUCOSE, CAPILLARY
GLUCOSE-CAPILLARY: 184 mg/dL — AB (ref 65–99)
GLUCOSE-CAPILLARY: 149 mg/dL — AB (ref 65–99)
Glucose-Capillary: 186 mg/dL — ABNORMAL HIGH (ref 65–99)
Glucose-Capillary: 172 mg/dL — ABNORMAL HIGH (ref 65–99)
Glucose-Capillary: 156 mg/dL — ABNORMAL HIGH (ref 65–99)

## 2014-10-13 LAB — LIPID PANEL
CHOLESTEROL: 232 mg/dL — AB (ref 0–200)
HDL: 26 mg/dL — AB (ref >40)
LDL CALC: UNDETERMINED mg/dL (ref 0–99)
TRIGLYCERIDES: 837 mg/dL — AB (ref <150)
Total CHOL/HDL Ratio: 8.9 RATIO
VLDL: UNDETERMINED mg/dL (ref 0–40)

## 2014-10-13 MED ORDER — HYDROMORPHONE HCL 1 MG/ML IJ SOLN
1.0000 mg | INTRAMUSCULAR | Status: DC | PRN
  Administered 2014-10-13 (×2): 1 mg via INTRAVENOUS
  Filled 2014-10-13 (×2): qty 1

## 2014-10-13 MED ORDER — INSULIN ASPART 100 UNIT/ML ~~LOC~~ SOLN: 0.0000 [IU] | Freq: Every day | SUBCUTANEOUS | Status: DC

## 2014-10-13 MED ORDER — GEMFIBROZIL 600 MG PO TABS
600.0000 mg | ORAL_TABLET | Freq: Every day | ORAL | Status: DC
  Administered 2014-10-13 – 2014-10-14 (×2): 600 mg via ORAL
  Filled 2014-10-13 (×3): qty 1

## 2014-10-13 MED ORDER — ENOXAPARIN SODIUM 40 MG/0.4ML ~~LOC~~ SOLN
40.0000 mg | SUBCUTANEOUS | Status: DC
  Administered 2014-10-13: 40 mg via SUBCUTANEOUS
  Filled 2014-10-13: qty 0.4

## 2014-10-13 MED ORDER — INSULIN ASPART 100 UNIT/ML ~~LOC~~ SOLN
0.0000 [IU] | Freq: Three times a day (TID) | SUBCUTANEOUS | Status: DC
  Administered 2014-10-13: 1 [IU] via SUBCUTANEOUS
  Administered 2014-10-14: 2 [IU] via SUBCUTANEOUS
  Administered 2014-10-14: 1 [IU] via SUBCUTANEOUS

## 2014-10-13 MED ORDER — FENOFIBRATE 160 MG PO TABS
160.0000 mg | ORAL_TABLET | Freq: Every day | ORAL | Status: DC
  Administered 2014-10-13 – 2014-10-14 (×2): 160 mg via ORAL
  Filled 2014-10-13 (×2): qty 1

## 2014-10-13 MED ORDER — INFLUENZA VAC SPLIT QUAD 0.5 ML IM SUSY
0.5000 mL | PREFILLED_SYRINGE | INTRAMUSCULAR | Status: AC
  Administered 2014-10-14: 0.5 mL via INTRAMUSCULAR
  Filled 2014-10-13 (×2): qty 0.5

## 2014-10-13 MED ORDER — OXYCODONE-ACETAMINOPHEN 5-325 MG PO TABS
1.0000 | ORAL_TABLET | Freq: Four times a day (QID) | ORAL | Status: DC | PRN
  Administered 2014-10-13: 1 via ORAL
  Filled 2014-10-13 (×2): qty 1

## 2014-10-13 MED ORDER — SODIUM CHLORIDE 0.9 % IV SOLN
1000.0000 mL | INTRAVENOUS | Status: AC
  Administered 2014-10-13 – 2014-10-14 (×2): 1000 mL via INTRAVENOUS

## 2014-10-13 MED ORDER — POTASSIUM CHLORIDE CRYS ER 20 MEQ PO TBCR
40.0000 meq | EXTENDED_RELEASE_TABLET | ORAL | Status: AC
  Administered 2014-10-13 (×2): 40 meq via ORAL
  Filled 2014-10-13 (×2): qty 2

## 2014-10-13 MED ORDER — ONDANSETRON HCL 4 MG/2ML IJ SOLN
4.0000 mg | Freq: Three times a day (TID) | INTRAMUSCULAR | Status: DC | PRN
  Administered 2014-10-13 (×2): 4 mg via INTRAVENOUS
  Filled 2014-10-13 (×2): qty 2

## 2014-10-13 MED ORDER — INSULIN ASPART 100 UNIT/ML ~~LOC~~ SOLN
0.0000 [IU] | SUBCUTANEOUS | Status: DC
  Administered 2014-10-13: 2 [IU] via SUBCUTANEOUS
  Administered 2014-10-13 (×2): 3 [IU] via SUBCUTANEOUS

## 2014-10-13 NOTE — Progress Notes (Signed)
Utilization review completed. Melvin Whiteford, RN, BSN. 

## 2014-10-13 NOTE — Progress Notes (Signed)
Patient Demographics:    Andre Ford, is a 34 y.o. male, DOB - 30-Apr-1980, VWU:[id number]  Admit date - 10/12/2014   Admitting Physician Joseph Art, DO  Outpatient Primary MD for the patient is Millsaps, Joelene Millin, NP  LOS - 1   Chief Complaint  Patient presents with  . Abdominal Pain        Subjective:    Andre Ford today has, No headache, No chest pain, much improved epigastric abdominal pain - No Nausea, No new weakness tingling or numbness, No Cough - SOB.     Assessment  & Plan :     1. Acute pancreatitis due to combination of high triglycerides and alcohol binge. Much improved with bowel rest, pain almost completely resolved, place on clear liquid diet, titrate to oral narcotics, continue IV fluids. Place on medications for high triglycerides. Counseled to quit alcohol. Dietary to educate the patient on heart healthy, low carb and low triglyceride diet   2. Alcohol binge. Drinks 1-2 times a month. No signs of DT. Counseled to quit alcohol binging.   3. Hypokalemia. Replaced. Recheck in the morning.    4. Possible type 2 diabetes mellitus undiagnosed. Pending A1c continue ISS.  No results found for: HGBA1C  CBG (last 3)   Recent Labs  10/13/14 0039 10/13/14 0403 10/13/14 0758  GLUCAP 184* 184* 186*     Code Status : Full  Family Communication  : None present  Disposition Plan  : Home in 1-2 days  Consults  :  None  Procedures  : CT scan abdomen pelvis. Suggestive of pancreatitis  DVT Prophylaxis  :  Lovenox    Lab Results  Component Value Date   PLT 138* 10/13/2014    Inpatient Medications  Scheduled Meds: . enoxaparin (LOVENOX) injection  40 mg Subcutaneous Q24H  . fenofibrate  160 mg Oral Daily  . gemfibrozil  600 mg Oral Daily  . [START ON  10/14/2014] Influenza vac split quadrivalent PF  0.5 mL Intramuscular Tomorrow-1000  . insulin aspart  0-15 Units Subcutaneous 6 times per day  . potassium chloride  40 mEq Oral Q4H   Continuous Infusions: . sodium chloride 1,000 mL (10/13/14 0655)   PRN Meds:.HYDROmorphone (DILAUDID) injection, ondansetron (ZOFRAN) IV  Antibiotics  :     Anti-infectives    None        Objective:   Filed Vitals:   10/13/14 0245 10/13/14 0305 10/13/14 0547 10/13/14 0913  BP: 134/82 131/85 133/78 136/82  Pulse:  94 96 88  Temp:  98.4 F (36.9 C) 98.7 F (37.1 C) 98.1 F (36.7 C)  TempSrc:  Oral Oral Oral  Resp: Height:      Weight:  127.2 kg (280 lb 6.8 oz)    SpO2:  95% 98% 98%    Wt Readings from Last 3 Encounters:  10/13/14 127.2 kg (280 lb 6.8 oz)  08/10/14 127.149 kg (280 lb 5 oz)     Intake/Output Summary (Last 24 hours) at 10/13/14 0935 Last data filed at 10/13/14 0914  Gross per 24 hour  Intake 370.42 ml  Output    600 ml  Net -229.58 ml     Physical Exam  Awake Alert, Oriented X 3,  No new F.N deficits, Normal affect [place].AT,PERRAL Supple Neck,No JVD, No cervical lymphadenopathy appriciated.  Symmetrical Chest wall movement, Good air movement bilaterally, CTAB RRR,No Gallops,Rubs or new Murmurs, No Parasternal Heave +ve B.Sounds, Abd Soft, No tenderness, No organomegaly appriciated, No rebound - guarding or rigidity. No Cyanosis, Clubbing or edema, No new Rash or bruise       Data Review:   Micro Results No results found for this or any previous visit (from the past 240 hour(s)).  Radiology Reports US Abdomen Complete  10/12/2014   CLINICAL DATA:  Right upper quadrant pain. Elevated lipase. Severe pain radiating from front to back for 24 hours.  EXAM: ULTRASOUND ABDOMEN COMPLETE  COMPARISON:  None.  FINDINGS: Gallbladder: Physiologically distended. No gallstones or wall thickening visualized. No sonographic Murphy sign noted.  Common bile duct:  Diameter: 3 mm  Liver: Diffusely increased in parenchymal echogenicity. Liver parenchyma is difficult to penetrate and suboptimally evaluated. No gross focal lesion identified.  IVC: Not well visualized due to body habitus.  Pancreas: Not visualized due to body habitus.  Spleen: Grossly normal in size.  Right Kidney: Length: 12.2 cm. Echogenicity within normal limits. No mass or hydronephrosis visualized.  Left Kidney: Length: 13.5 cm. Echogenicity within normal limits. No mass or hydronephrosis visualized.  Abdominal aorta: No aneurysm visualized. Majority of the abdominal aorta is obscured and not well visualized.  Other findings: No ascites. Technically limited exam due to body habitus.  IMPRESSION: 1. Hepatic steatosis. Liver parenchyma is difficult to penetrate and suboptimally evaluated. 2. Normal sonographic appearance of the gallbladder. 3. Midline structures including the pancreas are not visualized.   Electronically Signed   By: Rubye Oaks M.D.   On: 10/12/2014 23:02     CBC  Recent Labs Lab 10/12/14 1711 10/13/14 0500  WBC 12.0* 9.1  HGB 16.9 13.8  HCT 46.5 39.7  PLT 150 138*  MCV 84.5 85.6  MCH 30.7 29.7  MCHC 36.3* 34.8  RDW 13.2 13.6    Chemistries   Recent Labs Lab 10/12/14 1711 10/13/14 0500  NA 134* 134*  K 3.7 3.3*  CL 98* 99*  CO2 24 25  GLUCOSE 204* 198*  BUN 11 12  CREATININE 0.71 0.76  CALCIUM 9.9 9.0  AST 36 22  ALT 53 37  ALKPHOS 107 88  BILITOT 2.4* 1.7*   ------------------------------------------------------------------------------------------------------------------ estimated creatinine clearance is 176.9 mL/min (by C-G formula based on Cr of 0.76). ------------------------------------------------------------------------------------------------------------------ No results for input(s): HGBA1C in the last 72 hours. ------------------------------------------------------------------------------------------------------------------  Recent  Labs  10/13/14 0500  CHOL 232*  HDL 26*  LDLCALC UNABLE TO CALCULATE IF TRIGLYCERIDE OVER 400 mg/dL  TRIG 191*  CHOLHDL 8.9   ------------------------------------------------------------------------------------------------------------------ No results for input(s): TSH, T4TOTAL, T3FREE, THYROIDAB in the last 72 hours.  Invalid input(s): FREET3 ------------------------------------------------------------------------------------------------------------------ No results for input(s): VITAMINB12, FOLATE, FERRITIN, TIBC, IRON, RETICCTPCT in the last 72 hours.  Coagulation profile No results for input(s): INR, PROTIME in the last 168 hours.  No results for input(s): DDIMER in the last 72 hours.  Cardiac Enzymes No results for input(s): CKMB, TROPONINI, MYOGLOBIN in the last 168 hours.  Invalid input(s): CK ------------------------------------------------------------------------------------------------------------------ Invalid input(s): POCBNP   Time Spent in minutes   35   Susa Raring K M.D on 10/13/2014 at 9:35 AM  Between 7am to 7pm - Pager - [phone]  After 7pm go to [email or web address] - password [email or web address]  [facility] -  Office  [phone]

## 2014-10-14 DIAGNOSIS — E782 Mixed hyperlipidemia: Secondary | ICD-10-CM

## 2014-10-14 DIAGNOSIS — K859 Acute pancreatitis without necrosis or infection, unspecified: Secondary | ICD-10-CM | POA: Insufficient documentation

## 2014-10-14 DIAGNOSIS — E1165 Type 2 diabetes mellitus with hyperglycemia: Secondary | ICD-10-CM

## 2014-10-14 DIAGNOSIS — K852 Alcohol induced acute pancreatitis: Principal | ICD-10-CM

## 2014-10-14 LAB — COMPREHENSIVE METABOLIC PANEL
ALK PHOS: 80 U/L (ref 38–126)
ALT: 38 U/L (ref 17–63)
ANION GAP: 9 (ref 5–15)
AST: 27 U/L (ref 15–41)
Albumin: 3.2 g/dL — ABNORMAL LOW (ref 3.5–5.0)
BILIRUBIN TOTAL: 1.3 mg/dL — AB (ref 0.3–1.2)
BUN: 6 mg/dL (ref 6–20)
CALCIUM: 9 mg/dL (ref 8.9–10.3)
CO2: 25 mmol/L (ref 22–32)
Chloride: 101 mmol/L (ref 101–111)
Creatinine, Ser: 0.71 mg/dL (ref 0.61–1.24)
Glucose, Bld: 155 mg/dL — ABNORMAL HIGH (ref 65–99)
Potassium: 3.6 mmol/L (ref 3.5–5.1)
SODIUM: 135 mmol/L (ref 135–145)
TOTAL PROTEIN: 6.5 g/dL (ref 6.5–8.1)

## 2014-10-14 LAB — GLUCOSE, CAPILLARY
GLUCOSE-CAPILLARY: 138 mg/dL — AB (ref 65–99)
Glucose-Capillary: 197 mg/dL — ABNORMAL HIGH (ref 65–99)

## 2014-10-14 LAB — MAGNESIUM: MAGNESIUM: 1.9 mg/dL (ref 1.7–2.4)

## 2014-10-14 LAB — LIPASE, BLOOD: LIPASE: 62 U/L — AB (ref 22–51)

## 2014-10-14 LAB — HEMOGLOBIN A1C
Hgb A1c MFr Bld: 8 % — ABNORMAL HIGH (ref 4.8–5.6)
MEAN PLASMA GLUCOSE: 183 mg/dL

## 2014-10-14 MED ORDER — METFORMIN HCL 500 MG PO TABS: 500.0000 mg | ORAL_TABLET | Freq: Two times a day (BID) | ORAL | Status: DC

## 2014-10-14 MED ORDER — ENOXAPARIN SODIUM 60 MG/0.6ML ~~LOC~~ SOLN
60.0000 mg | SUBCUTANEOUS | Status: DC
  Administered 2014-10-14: 60 mg via SUBCUTANEOUS
  Filled 2014-10-14: qty 0.6

## 2014-10-14 MED ORDER — GEMFIBROZIL 600 MG PO TABS: 600.0000 mg | ORAL_TABLET | Freq: Every day | ORAL | Status: DC

## 2014-10-14 MED ORDER — LIVING WELL WITH DIABETES BOOK
Freq: Once | Status: AC
  Administered 2014-10-14: 12:00:00
  Filled 2014-10-14: qty 1

## 2014-10-14 MED ORDER — FENOFIBRATE 160 MG PO TABS: 160.0000 mg | ORAL_TABLET | Freq: Every day | ORAL | Status: DC

## 2014-10-14 NOTE — Progress Notes (Signed)
Pharmacy: Lovenox Dose Adjustment for VTE Prophylaxis  OBJECTIVE:  Wt: 127.2 kg Ht:  BMI: 39.1 SCr: 0.71, estimated CrCl>100 ml/min  ASSESSMENT:  34 YOM requiring a lovenox dose adjustment for VTE prophylaxis in the setting of BMI>30 and CrCl>30 ml/min.   PLAN:  1. Adjust Lovenox to 60 mg SQ every 24 hours (~0.5 mg/kg) 2. Will monitor peripherally for any bleeding issues or adjustments  Georgina Pillion, PharmD, BCPS Clinical Pharmacist Pager: [phone] 10/14/2014 10:25 AM

## 2014-10-14 NOTE — Plan of Care (Signed)
Problem: Food- and Nutrition-Related Knowledge Deficit (NB-1.1) Goal: Nutrition education Formal process to instruct or train a patient/client in a skill or to impart knowledge to help patients/clients voluntarily manage or modify food choices and eating behavior to maintain or improve health.  Outcome: Completed/Met Date Met:  10/14/14 Nutrition Education Note  RD consulted for nutrition education regarding a Heart Healthy, low carbohydrate, and low TG diet.   RD provided "Pancreatitis Nutrition Therapy" handout from the Academy of [employer]. Reviewed patient's dietary recall. Encouraged consumption of at least 3 meals daily. Provided examples on ways to decrease fat intake in diet. Encouraged fresh fruits and vegetables as well as whole grain sources of carbohydrates to maximize fiber intake. Discussed appropriate serving sizes of carbohydrate containing foods. Discussed diabetic friendly drink options. Teach back method used.  Expect good compliance.  Body mass index is 39.13 kg/(m^2). Pt meets criteria for class II obesity based on current BMI.  Current diet order is soft diet, patient is consuming approximately 100% of meals at this time. Appetite is fine with no abdominal pain. Labs and medications reviewed. No further nutrition interventions warranted at this time. RD contact information provided. If additional nutrition issues arise, please re-consult RD.  Plans for discharge today.   Corrin Parker, MS, RD, LDN Pager # [phone] After hours/ weekend pager # [phone]

## 2014-10-14 NOTE — Discharge Summary (Signed)
Physician Discharge Summary  Andre Ford XBM:[id number] DOB: March 11, 1980 DOA: 10/12/2014  PCP: Egbert Garibaldi, NP  Admit date: 10/12/2014 Discharge date: 10/14/2014  Recommendations for Outpatient Follow-up:  1. Pt will need to follow up with PCP in 2 weeks post discharge 2. Please obtain BMP in 2 weeks 3. Lipid panel in 2-3 months  Discharge Diagnoses:  Acute pancreatitis -Secondary to hypertriglyceridemia in the setting of alcohol binge -Clinically improved -Patient was initially placed npo and his diet was gradually advanced -Diet was advanced to soft diet which the patient tolerated Hypertriglyceridemia -triglycerides 837 -noncompliant with lopid in outpt setting -home with fenofibrate and gemfibrozil Alcohol binge -Patient had large amounts of beer on the weekend of admission -Patient states that he normally only drinks once or twice per month -No signs of withdrawal Hypokalemia  -repleted Diabetes mellitus type 2, uncontrolled  -Start metformin  -Follow up with primary care provider to titrate medications to affect  -Hemoglobin A1c 8.0   morbid obesity -BMI 39.1 -lifestyle modification  Discharge Condition: stable  Disposition: home  Diet:carb modified Wt Readings from Last 3 Encounters:  10/13/14 127.2 kg (280 lb 6.8 oz)  08/10/14 127.149 kg (280 lb 5 oz)    History of present illness:  34 y.o. male  With PMHX of urethral stricture, HLD, HTN comes in with epigastric pain radiating to his back that comes in waves and started on 10/11/2014. Patient endorsed Recent weekend with binge drinking 5-6 beers per day with some white liquor.Patient visited his primary care provider who sent the patient to emergency department for evaluation. The emergency department, lipase was noted to be 120. Abdominal ultrasound was limited due to body habitus but revealed fatty liver with negative gallbladder. Triglycerides 837.  Discharge Exam: Filed Vitals:   10/14/14  0435  BP: 107/74  Pulse: 78  Temp: 98.7 F (37.1 C)  Resp: 18   Filed Vitals:   10/13/14 0913 10/13/14 1526 10/13/14 2020 10/14/14 0435  BP: 136/82 117/65 125/74 107/74  Pulse: 88 82 84 78  Temp: 98.1 F (36.7 C) 98.7 F (37.1 C) 98.3 F (36.8 C) 98.7 F (37.1 C)  TempSrc: Oral Oral Oral Oral  Resp: Height:      Weight:      SpO2: 98% 98% 99% 99%   General: A&O x 3, NAD, pleasant, cooperative Cardiovascular: RRR, no rub, no gallop, no S3 Respiratory: CTAB, no wheeze, no rhonchi Abdomen:soft, nontender, nondistended, positive bowel sounds Extremities: trace LE edema, No lymphangitis, no petechiae  Discharge Instructions      Discharge Instructions    Diet - low sodium heart healthy    Complete by:  As directed      Increase activity slowly    Complete by:  As directed             Medication List    STOP taking these medications        losartan 100 MG tablet  Commonly known as:  COZAAR      TAKE these medications        fenofibrate 160 MG tablet  Take 1 tablet (160 mg total) by mouth daily.     gemfibrozil 600 MG tablet  Commonly known as:  LOPID  Take 1 tablet (600 mg total) by mouth daily.     metFORMIN 500 MG tablet  Commonly known as:  GLUCOPHAGE  Take 1 tablet (500 mg total) by mouth 2 (two) times daily with a meal.  The results of significant diagnostics from this hospitalization (including imaging, microbiology, ancillary and laboratory) are listed below for reference.    Significant Diagnostic Studies: US Abdomen Complete  10/12/2014   CLINICAL DATA:  Right upper quadrant pain. Elevated lipase. Severe pain radiating from front to back for 24 hours.  EXAM: ULTRASOUND ABDOMEN COMPLETE  COMPARISON:  None.  FINDINGS: Gallbladder: Physiologically distended. No gallstones or wall thickening visualized. No sonographic Murphy sign noted.  Common bile duct: Diameter: 3 mm  Liver: Diffusely increased in parenchymal echogenicity.  Liver parenchyma is difficult to penetrate and suboptimally evaluated. No gross focal lesion identified.  IVC: Not well visualized due to body habitus.  Pancreas: Not visualized due to body habitus.  Spleen: Grossly normal in size.  Right Kidney: Length: 12.2 cm. Echogenicity within normal limits. No mass or hydronephrosis visualized.  Left Kidney: Length: 13.5 cm. Echogenicity within normal limits. No mass or hydronephrosis visualized.  Abdominal aorta: No aneurysm visualized. Majority of the abdominal aorta is obscured and not well visualized.  Other findings: No ascites. Technically limited exam due to body habitus.  IMPRESSION: 1. Hepatic steatosis. Liver parenchyma is difficult to penetrate and suboptimally evaluated. 2. Normal sonographic appearance of the gallbladder. 3. Midline structures including the pancreas are not visualized.   Electronically Signed   By: Rubye Oaks M.D.   On: 10/12/2014 23:02     Microbiology: No results found for this or any previous visit (from the past 240 hour(s)).   Labs: Basic Metabolic Panel:  Recent Labs Lab 10/12/14 1711 10/13/14 0500 10/14/14 0440  NA 134* 134* 135  K 3.7 3.3* 3.6  CL 98* 99* 101  CO2 24 25 25   GLUCOSE 204* 198* 155*  BUN 11 12 6   CREATININE 0.71 0.76 0.71  CALCIUM 9.9 9.0 9.0  MG  --   --  1.9   Liver Function Tests:  Recent Labs Lab 10/12/14 1711 10/13/14 0500 10/14/14 0440  AST 36 22 27  ALT 53 37 38  ALKPHOS 107 88 80  BILITOT 2.4* 1.7* 1.3*  PROT 7.6 6.9 6.5  ALBUMIN 4.4 3.5 3.2*    Recent Labs Lab 10/12/14 1711 10/14/14 0440  LIPASE 120* 62*   No results for input(s): AMMONIA in the last 168 hours. CBC:  Recent Labs Lab 10/12/14 1711 10/13/14 0500  WBC 12.0* 9.1  HGB 16.9 13.8  HCT 46.5 39.7  MCV 84.5 85.6  PLT 150 138*   Cardiac Enzymes: No results for input(s): CKTOTAL, CKMB, CKMBINDEX, TROPONINI in the last 168 hours. BNP: Invalid input(s): POCBNP CBG:  Recent Labs Lab  10/13/14 0758 10/13/14 1153 10/13/14 1658 10/13/14 2015 10/14/14 0820  GLUCAP 186* 172* 149* 156* 197*    Time coordinating discharge:  Greater than 30 minutes  Signed:  Evalyne Cortopassi, DO [facility] Pager: [phone] 10/14/2014, 10:48 AM

## 2014-10-14 NOTE — Progress Notes (Signed)
10/14/2014. 3:23 PM Andre Ford to be D/C'd Home per MD order.  Discussed prescriptions and follow up appointments with the patient. Prescriptions given to patient, medication list explained in detail. Pt verbalized understanding.    Medication List    STOP taking these medications        losartan 100 MG tablet  Commonly known as:  COZAAR      TAKE these medications        fenofibrate 160 MG tablet  Take 1 tablet (160 mg total) by mouth daily.     gemfibrozil 600 MG tablet  Commonly known as:  LOPID  Take 1 tablet (600 mg total) by mouth daily.     metFORMIN 500 MG tablet  Commonly known as:  GLUCOPHAGE  Take 1 tablet (500 mg total) by mouth 2 (two) times daily with a meal.        Filed Vitals:   10/14/14 0912  BP: 110/68  Pulse: 80  Temp: 98.4 F (36.9 C)  Resp: 18    Skin clean, dry and intact without evidence of skin break down, no evidence of skin tears noted. IV catheter discontinued intact. Site without signs and symptoms of complications. Dressing and pressure applied. Pt denies pain at this time. No complaints noted.  An After Visit Summary was printed and given to the patient. Patient escorted via WC, and D/C home via private auto.  Bradly Chris

## 2014-10-14 NOTE — Progress Notes (Signed)
Briefly spoke to patient and wife regarding A1C and new diagnosis of diabetes.  Reinforced importance of A1C and discussed Metformin actions and side effects with patient.  Patient admits that he has gained weight recently and wants to improve overall health.  Outpatient diabetes education ordered per protocol.  Briefly discussed effects of diabetes and the role of CHO on blood sugars.  Patient and wife had great questions and verbalized understanding.  Thanks, Beryl Meager, RN, BC-ADM Inpatient Diabetes Coordinator Pager [phone] (8a-5p)

## 2016-12-14 ENCOUNTER — Emergency Department (HOSPITAL_COMMUNITY): Payer: BLUE CROSS/BLUE SHIELD

## 2016-12-14 ENCOUNTER — Encounter (HOSPITAL_COMMUNITY): Payer: Self-pay | Admitting: Emergency Medicine

## 2016-12-14 ENCOUNTER — Other Ambulatory Visit: Payer: Self-pay

## 2016-12-14 ENCOUNTER — Emergency Department (HOSPITAL_COMMUNITY)
Admission: EM | Admit: 2016-12-14 | Discharge: 2016-12-14 | Disposition: A | Discharge status: Home / Self Care | Payer: BLUE CROSS/BLUE SHIELD | Attending: Emergency Medicine | Admitting: Emergency Medicine

## 2016-12-14 DIAGNOSIS — I10 Essential (primary) hypertension: Secondary | ICD-10-CM | POA: Insufficient documentation

## 2016-12-14 DIAGNOSIS — R1010 Upper abdominal pain, unspecified: Secondary | ICD-10-CM

## 2016-12-14 DIAGNOSIS — Z87891 Personal history of nicotine dependence: Secondary | ICD-10-CM | POA: Insufficient documentation

## 2016-12-14 DIAGNOSIS — R739 Hyperglycemia, unspecified: Secondary | ICD-10-CM

## 2016-12-14 DIAGNOSIS — K8500 Idiopathic acute pancreatitis without necrosis or infection: Secondary | ICD-10-CM | POA: Diagnosis not present

## 2016-12-14 LAB — CBC
HCT: 39.8 % (ref 39.0–52.0)
HEMOGLOBIN: 13.6 g/dL (ref 13.0–17.0)
MCH: 29.1 pg (ref 26.0–34.0)
MCHC: 34.2 g/dL (ref 30.0–36.0)
MCV: 85 fL (ref 78.0–100.0)
PLATELETS: 297 10*3/uL (ref 150–400)
RBC: 4.68 MIL/uL (ref 4.22–5.81)
RDW: 14.7 % (ref 11.5–15.5)
WBC: 6.8 10*3/uL (ref 4.0–10.5)

## 2016-12-14 LAB — COMPREHENSIVE METABOLIC PANEL
ALBUMIN: 3.9 g/dL (ref 3.5–5.0)
ALK PHOS: 108 U/L (ref 38–126)
ALT: 45 U/L (ref 17–63)
AST: 43 U/L — AB (ref 15–41)
Anion gap: 9 (ref 5–15)
BUN: 15 mg/dL (ref 6–20)
CALCIUM: 8.7 mg/dL — AB (ref 8.9–10.3)
CHLORIDE: 99 mmol/L — AB (ref 101–111)
CO2: 20 mmol/L — AB (ref 22–32)
GLUCOSE: 200 mg/dL — AB (ref 65–99)
Potassium: 4.4 mmol/L (ref 3.5–5.1)
SODIUM: 128 mmol/L — AB (ref 135–145)
Total Bilirubin: 2.7 mg/dL — ABNORMAL HIGH (ref 0.3–1.2)
Total Protein: 7 g/dL (ref 6.5–8.1)

## 2016-12-14 LAB — LIPASE, BLOOD: Lipase: 116 U/L — ABNORMAL HIGH (ref 11–51)

## 2016-12-14 MED ORDER — HYDROMORPHONE HCL 1 MG/ML IJ SOLN
1.0000 mg | Freq: Once | INTRAMUSCULAR | Status: AC
  Administered 2016-12-14: 1 mg via INTRAVENOUS
  Filled 2016-12-14: qty 1

## 2016-12-14 MED ORDER — ONDANSETRON HCL 4 MG/2ML IJ SOLN
4.0000 mg | Freq: Once | INTRAMUSCULAR | Status: AC
  Administered 2016-12-14: 4 mg via INTRAVENOUS
  Filled 2016-12-14: qty 2

## 2016-12-14 MED ORDER — OXYCODONE-ACETAMINOPHEN 5-325 MG PO TABS
1.0000 | ORAL_TABLET | Freq: Four times a day (QID) | ORAL | 0 refills | Status: DC | PRN
Start: 2016-12-14 — End: 2017-01-24

## 2016-12-14 MED ORDER — METFORMIN HCL 500 MG PO TABS: 500.0000 mg | ORAL_TABLET | Freq: Two times a day (BID) | ORAL | 1 refills | Status: DC

## 2016-12-14 MED ORDER — ONDANSETRON 4 MG PO TBDP: 4.0000 mg | ORAL_TABLET | Freq: Three times a day (TID) | ORAL | 0 refills | Status: DC | PRN

## 2016-12-14 MED ORDER — METFORMIN HCL 500 MG PO TABS
500.0000 mg | ORAL_TABLET | Freq: Once | ORAL | Status: AC
  Administered 2016-12-14: 500 mg via ORAL
  Filled 2016-12-14: qty 1

## 2016-12-14 MED ORDER — SODIUM CHLORIDE 0.9 % IV BOLUS (SEPSIS)
1000.0000 mL | Freq: Once | INTRAVENOUS | Status: AC
  Administered 2016-12-14: 1000 mL via INTRAVENOUS

## 2016-12-14 NOTE — ED Triage Notes (Signed)
Pt presents to ED for assessment of RUQ and epigastric abdominal pain, starting yesterday.  Hx of pancreatitis, states it feels the same but also different.  Pt denies vomiting, c/o nausea with pain increases

## 2016-12-14 NOTE — Discharge Instructions (Signed)
To find a primary care or specialty doctor please call [phone] or [phone] to access "[facility] Find a Doctor Service." ° °You may also go on the [facility] website at [email or web address] ° °There are also multiple [facility], [facility] practices throughout the [place] that are frequently accepting new patients. You may find a clinic that is close to your home and contact them. ° °[facility] -  °[address] °[place] [address] °[phone] ° ° °[facility] -  °[address] ° [place] [address] [phone] ° ° °[facility] - °[address]  °[place] [address] °[phone] ° ° °

## 2016-12-14 NOTE — ED Provider Notes (Signed)
TIME SEEN: 4:38 AM  CHIEF COMPLAINT: Abdominal pain, nausea  HPI: Patient is a 36 year old male with history of hypertension, hyperlipidemia, previous pancreatitis who presents to the emergency department upper abdominal pain that is sharp in nature and radiates into his back that started yesterday.  Has had nausea but no vomiting.  No fevers, chills, diarrhea, bloody stools or melena.  Has been taking NSAIDs intermittently for this pain but does not take them regularly.  States he has not drink alcohol in over 1 year.  States he often gets similar symptoms but is able to avoid eating, drink lots of fluids and rest and symptoms improved.  States that pain was so severe tonight that he could not sleep.  No history of gallstones.  States that they think he has prior episodes of pancreatitis were related to high triglycerides and intermittent alcohol use.  ROS: See HPI Constitutional: no fever  Eyes: no drainage  ENT: no runny nose   Cardiovascular:  no chest pain  Resp: no SOB  GI: no vomiting GU: no dysuria Integumentary: no rash  Allergy: no hives  Musculoskeletal: no leg swelling  Neurological: no slurred speech ROS otherwise negative  PAST MEDICAL HISTORY/PAST SURGICAL HISTORY:  Past Medical History:  Diagnosis Date  . Arthritis   . At risk for sleep apnea    STOP-BANG= 6     SENT TO PCP 08-05-2014  . Hyperlipidemia   . Hypertension   . Obesity   . Pancreatitis   . Urethral stricture     MEDICATIONS:  Prior to Admission medications   Medication Sig Start Date End Date Taking? Authorizing Provider  fenofibrate 160 MG tablet Take 1 tablet (160 mg total) by mouth daily. 10/14/14   Catarina Hartshornat, David, MD  gemfibrozil (LOPID) 600 MG tablet Take 1 tablet (600 mg total) by mouth daily. 10/14/14   Catarina Hartshornat, David, MD  metFORMIN (GLUCOPHAGE) 500 MG tablet Take 1 tablet (500 mg total) by mouth 2 (two) times daily with a meal. 10/14/14   Catarina Hartshornat, David, MD    ALLERGIES:  No Known Allergies  SOCIAL  HISTORY:  Social History   Tobacco Use  . Smoking status: Former Smoker    Years: 10.00    Types: Cigarettes    Last attempt to quit: 08/05/2011    Years since quitting: 5.3  . Smokeless tobacco: Never Used  Substance Use Topics  . Alcohol use: Yes    Comment: occasional    FAMILY HISTORY: History reviewed. No pertinent family history.  EXAM: BP (!) 146/92 (BP Location: Right Arm)   Pulse 84   Temp 97.7 F (36.5 C) (Oral)   Resp 20   SpO2 100%  CONSTITUTIONAL: Alert and oriented and responds appropriately to questions. Well-appearing; well-nourished HEAD: Normocephalic EYES: Conjunctivae clear, pupils appear equal, EOMI ENT: normal nose; moist mucous membranes NECK: Supple, no meningismus, no nuchal rigidity, no LAD  CARD: RRR; S1 and S2 appreciated; no murmurs, no clicks, no rubs, no gallops RESP: Normal chest excursion without splinting or tachypnea; breath sounds clear and equal bilaterally; no wheezes, no rhonchi, no rales, no hypoxia or respiratory distress, speaking full sentences ABD/GI: Normal bowel sounds; non-distended; soft, tender in the epigastric region and right upper quadrant with negative Murphy sign, no rebound, no guarding, no peritoneal signs, no hepatosplenomegaly BACK:  The back appears normal and is non-tender to palpation, there is no CVA tenderness EXT: Normal ROM in all joints; non-tender to palpation; no edema; normal capillary refill; no cyanosis, no calf tenderness  or swelling    SKIN: Normal color for age and race; warm; no rash NEURO: Moves all extremities equally PSYCH: The patient's mood and manner are appropriate. Grooming and personal hygiene are appropriate.  MEDICAL DECISION MAKING: Patient here with epigastric abdominal pain and right upper quadrant abdominal pain.  Differential includes pancreatitis, cholelithiasis, cholecystitis, choledocholithiasis, gastritis, GERD.  Doubt appendicitis, bowel obstruction, diverticulitis or colitis.  Will  obtain labs and right upper quadrant abdominal ultrasound.  Will give IV fluids, pain and nausea medicine.  ED PROGRESS: Patient's lipase is 116.  He is hyperglycemic but not in DKA.  This could be the cause of his symptoms today.  Gallbladder appears normal without gallstones.  We will start him on metformin twice daily.  He is receiving IV hydration.  He reports his pain has improved.  He would like discharge home.   Patient reports his pain is well controlled after 2 doses of Dilaudid.   Liver function tests appear to be at baseline.  I have recommended close follow-up with a PCP to have his triglycerides and his hemoglobin A1c rechecked.  Patient is comfortable with this plan.  He would prefer discharge over admission.  Will discharge with pain and nausea medication.  He states he is comfortable with a liquid diet for the next several days and then will transition to bland food slowly.  We discussed at length return precautions.   At this time, I do not feel there is any life-threatening condition present. I have reviewed and discussed all results (EKG, imaging, lab, urine as appropriate) and exam findings with patient/family. I have reviewed nursing notes and appropriate previous records.  I feel the patient is safe to be discharged home without further emergent workup and can continue workup as an outpatient as needed. Discussed usual and customary return precautions. Patient/family verbalize understanding and are comfortable with this plan.  Outpatient follow-up has been provided if needed. All questions have been answered.     Jasani Dolney, Layla MawKristen N, DO 12/14/16 [phone]

## 2017-01-24 ENCOUNTER — Other Ambulatory Visit: Payer: Self-pay

## 2017-01-24 ENCOUNTER — Emergency Department (HOSPITAL_COMMUNITY)
Admission: EM | Admit: 2017-01-24 | Discharge: 2017-01-24 | Disposition: A | Discharge status: Home / Self Care | Payer: BLUE CROSS/BLUE SHIELD | Attending: Emergency Medicine | Admitting: Emergency Medicine

## 2017-01-24 ENCOUNTER — Emergency Department (HOSPITAL_COMMUNITY): Payer: BLUE CROSS/BLUE SHIELD

## 2017-01-24 ENCOUNTER — Encounter (HOSPITAL_COMMUNITY): Payer: Self-pay | Admitting: Nurse Practitioner

## 2017-01-24 DIAGNOSIS — N201 Calculus of ureter: Secondary | ICD-10-CM | POA: Diagnosis not present

## 2017-01-24 DIAGNOSIS — I10 Essential (primary) hypertension: Secondary | ICD-10-CM | POA: Diagnosis not present

## 2017-01-24 DIAGNOSIS — E119 Type 2 diabetes mellitus without complications: Secondary | ICD-10-CM | POA: Diagnosis not present

## 2017-01-24 DIAGNOSIS — Z7984 Long term (current) use of oral hypoglycemic drugs: Secondary | ICD-10-CM | POA: Insufficient documentation

## 2017-01-24 DIAGNOSIS — Z79899 Other long term (current) drug therapy: Secondary | ICD-10-CM | POA: Diagnosis not present

## 2017-01-24 DIAGNOSIS — R109 Unspecified abdominal pain: Secondary | ICD-10-CM | POA: Diagnosis present

## 2017-01-24 DIAGNOSIS — N23 Unspecified renal colic: Secondary | ICD-10-CM

## 2017-01-24 DIAGNOSIS — Z87891 Personal history of nicotine dependence: Secondary | ICD-10-CM | POA: Diagnosis not present

## 2017-01-24 LAB — URINALYSIS, ROUTINE W REFLEX MICROSCOPIC
BILIRUBIN URINE: NEGATIVE
Glucose, UA: 150 mg/dL — AB
Ketones, ur: NEGATIVE mg/dL
Nitrite: NEGATIVE
Protein, ur: 30 mg/dL — AB
SPECIFIC GRAVITY, URINE: 1.029 (ref 1.005–1.030)
pH: 5 (ref 5.0–8.0)

## 2017-01-24 LAB — CBC
HCT: 42.7 % (ref 39.0–52.0)
HEMOGLOBIN: 15.5 g/dL (ref 13.0–17.0)
MCH: 30.4 pg (ref 26.0–34.0)
MCHC: 36.3 g/dL — ABNORMAL HIGH (ref 30.0–36.0)
MCV: 83.7 fL (ref 78.0–100.0)
Platelets: 176 10*3/uL (ref 150–400)
RBC: 5.1 MIL/uL (ref 4.22–5.81)
RDW: 13.6 % (ref 11.5–15.5)
WBC: 7.9 10*3/uL (ref 4.0–10.5)

## 2017-01-24 LAB — COMPREHENSIVE METABOLIC PANEL
ALK PHOS: 119 U/L (ref 38–126)
ALT: 30 U/L (ref 17–63)
ANION GAP: 7 (ref 5–15)
AST: 29 U/L (ref 15–41)
Albumin: 4.2 g/dL (ref 3.5–5.0)
BUN: 12 mg/dL (ref 6–20)
CALCIUM: 9.6 mg/dL (ref 8.9–10.3)
CHLORIDE: 102 mmol/L (ref 101–111)
CO2: 22 mmol/L (ref 22–32)
Creatinine, Ser: 0.96 mg/dL (ref 0.61–1.24)
GFR calc non Af Amer: >60 mL/min (ref >60)
Glucose, Bld: 244 mg/dL — ABNORMAL HIGH (ref 65–99)
POTASSIUM: 4.5 mmol/L (ref 3.5–5.1)
SODIUM: 131 mmol/L — AB (ref 135–145)
Total Bilirubin: 1.1 mg/dL (ref 0.3–1.2)
Total Protein: 7.2 g/dL (ref 6.5–8.1)

## 2017-01-24 LAB — LIPASE, BLOOD: LIPASE: 35 U/L (ref 11–51)

## 2017-01-24 MED ORDER — FENTANYL CITRATE (PF) 100 MCG/2ML IJ SOLN
100.0000 ug | Freq: Once | INTRAMUSCULAR | Status: AC
  Administered 2017-01-24: 100 ug via INTRAVENOUS
  Filled 2017-01-24: qty 2

## 2017-01-24 MED ORDER — OXYCODONE-ACETAMINOPHEN 5-325 MG PO TABS: 1.0000 | ORAL_TABLET | ORAL | Status: DC | PRN

## 2017-01-24 MED ORDER — KETOROLAC TROMETHAMINE 30 MG/ML IJ SOLN
30.0000 mg | Freq: Once | INTRAMUSCULAR | Status: AC
  Administered 2017-01-24: 30 mg via INTRAVENOUS
  Filled 2017-01-24: qty 1

## 2017-01-24 MED ORDER — HYDROMORPHONE HCL 1 MG/ML IJ SOLN
1.0000 mg | Freq: Once | INTRAMUSCULAR | Status: AC
  Administered 2017-01-24: 1 mg via INTRAVENOUS
  Filled 2017-01-24: qty 1

## 2017-01-24 MED ORDER — TAMSULOSIN HCL 0.4 MG PO CAPS: ORAL_CAPSULE | ORAL | 0 refills | Status: DC

## 2017-01-24 MED ORDER — OXYCODONE-ACETAMINOPHEN 5-325 MG PO TABS: 1.0000 | ORAL_TABLET | ORAL | 0 refills | Status: DC | PRN

## 2017-01-24 MED ORDER — FENTANYL CITRATE (PF) 100 MCG/2ML IJ SOLN
50.0000 ug | INTRAMUSCULAR | Status: DC | PRN
  Administered 2017-01-24: 50 ug via INTRAVENOUS
  Filled 2017-01-24: qty 2

## 2017-01-24 NOTE — ED Provider Notes (Signed)
[facility]-EMERGENCY DEPT Provider Note   CSN: [id number] Arrival date & time: 01/24/17  1007     History   Chief Complaint Chief Complaint  Patient presents with  . Flank Pain  . Back Pain    HPI Andre Ford is a 36 y.o. male.  He presents for evaluation of right flank pain radiating to right groin region, since early this morning.  The pain is severe and unrelenting.  He has a sensation of urinary urgency without dysuria or hematuria.  He believes that he has a kidney stone.  He has never had a kidney stone.  He denies fever, chills, vomiting, weakness or dizziness.  There are no other known modifying factors.   HPI  Past Medical History:  Diagnosis Date  . Arthritis   . At risk for sleep apnea    STOP-BANG= 6     SENT TO PCP 08-05-2014  . Hyperlipidemia   . Hypertension   . Obesity   . Pancreatitis   . Urethral stricture     Patient Active Problem List   Diagnosis Date Noted  . Hypercholesterolemia with hypertriglyceridemia 10/14/2014  . Type 2 diabetes mellitus with hyperglycemia (HCC) 10/14/2014  . Morbid obesity (HCC) 10/14/2014  . Acute pancreatitis   . Pancreatitis 10/12/2014  . Hepatic steatosis 10/12/2014  . Alcohol abuse, episodic 10/12/2014  . Obesity 10/12/2014    Past Surgical History:  Procedure Laterality Date  . APPENDECTOMY  age 89  . CYSTOSCOPY WITH URETHRAL DILATATION N/A 08/10/2014   Procedure: CYSTOSCOPY WITH URETHRAL DILATATION;  Surgeon: Malen GauzePatrick L McKenzie, MD;  [facility];  Service: Urology;  Laterality: N/A;  . KNEE ARTHROSCOPY WITH ANTERIOR CRUCIATE LIGAMENT (ACL) REPAIR Left 2000  . ORIF RIGHT HAND FX  2000  . PENILE BIOPSY N/A 08/10/2014   Procedure: Core biopsy of penile glands lesion;  Surgeon: Malen GauzePatrick L McKenzie, MD;  [facility];  Service: Urology;  Laterality: N/A;       Home Medications    Prior to Admission medications   Medication Sig Start  Date End Date Taking? Authorizing Provider  cephALEXin (KEFLEX) 500 MG capsule Take 500 mg by mouth 2 (two) times daily.   Yes [provider]  gemfibrozil (LOPID) 600 MG tablet Take 1 tablet (600 mg total) by mouth daily. 10/14/14  Yes Tat, Onalee Huaavid, MD  metFORMIN (GLUCOPHAGE) 500 MG tablet Take 1 tablet (500 mg total) 2 (two) times daily by mouth. 12/14/16  Yes Ward, Kristen N, DO  ondansetron (ZOFRAN ODT) 4 MG disintegrating tablet Take 1 tablet (4 mg total) every 8 (eight) hours as needed by mouth for nausea or vomiting. Patient not taking: Reported on 01/24/2017 12/14/16   Ward, Layla MawKristen N, DO  oxyCODONE-acetaminophen (PERCOCET) 5-325 MG tablet Take 1 tablet by mouth every 4 (four) hours as needed for severe pain. 01/24/17   Andre BaleWentz, Bradee Common, MD  tamsulosin [facility](FLOMAX) 0.4 MG CAPS capsule 1 q HS to aid stone passage 01/24/17   Andre BaleWentz, Marlena Barbato, MD    Family History No family history on file.  Social History Social History   Tobacco Use  . Smoking status: Former Smoker    Years: 10.00    Types: Cigarettes    Last attempt to quit: 08/05/2011    Years since quitting: 5.4  . Smokeless tobacco: Never Used  Substance Use Topics  . Alcohol use: Yes    Comment: occasional  . Drug use: No     Allergies  Patient has no known allergies.   Review of Systems Review of Systems  All other systems reviewed and are negative.    Physical Exam Updated Vital Signs BP 126/78 (BP Location: Right Arm)   Pulse 79   Temp 98.5 F (36.9 C)   Resp 16   Ht 5\' 11"  (1.803 m)   Wt 117.9 kg (260 lb)   SpO2 99%   BMI 36.26 kg/m   Physical Exam  Constitutional: He is oriented to person, place, and time. He appears well-developed and well-nourished. He appears distressed (He is uncomfortable).  HENT:  Head: Normocephalic and atraumatic.  Right Ear: External ear normal.  Left Ear: External ear normal.  Eyes: Conjunctivae and EOM are normal. Pupils are equal, round, and reactive to light.    Neck: Normal range of motion and phonation normal. Neck supple.  Cardiovascular: Normal rate, regular rhythm and normal heart sounds.  Pulmonary/Chest: Effort normal and breath sounds normal. He exhibits no bony tenderness.  Abdominal: Soft. There is no tenderness.  Genitourinary:  Genitourinary Comments: No costovertebral angle tenderness with percussion  Musculoskeletal: Normal range of motion. He exhibits no edema or deformity.  Neurological: He is alert and oriented to person, place, and time. No cranial nerve deficit or sensory deficit. He exhibits normal muscle tone. Coordination normal.  Skin: Skin is warm, dry and intact.  Psychiatric: He has a normal mood and affect. His behavior is normal. Judgment and thought content normal.  Nursing note and vitals reviewed.    ED Treatments / Results  Labs (all labs ordered are listed, but only abnormal results are displayed) Labs Reviewed  COMPREHENSIVE METABOLIC PANEL - Abnormal; Notable for the following components:      Result Value   Sodium 131 (*)    Glucose, Bld 244 (*)    All other components within normal limits  CBC - Abnormal; Notable for the following components:   MCHC 36.3 (*)    All other components within normal limits  URINALYSIS, ROUTINE W REFLEX MICROSCOPIC - Abnormal; Notable for the following components:   Glucose, UA 150 (*)    Hgb urine dipstick SMALL (*)    Protein, ur 30 (*)    Leukocytes, UA MODERATE (*)    Bacteria, UA RARE (*)    Squamous Epithelial / LPF 0-5 (*)    All other components within normal limits  LIPASE, BLOOD    EKG  EKG Interpretation None       Radiology Ct Renal Stone Study  Result Date: 01/24/2017 CLINICAL DATA:  Acute right flank pain. EXAM: CT ABDOMEN AND PELVIS WITHOUT CONTRAST TECHNIQUE: Multidetector CT imaging of the abdomen and pelvis was performed following the standard protocol without IV contrast. COMPARISON:  None. FINDINGS: Lower chest: No acute abnormality.  Hepatobiliary: No gallstones are noted. Fatty infiltration of the liver is noted, with sparing noted in left hepatic lobe. Pancreas: Unremarkable. No pancreatic ductal dilatation or surrounding inflammatory changes. Spleen: Normal in size without focal abnormality. Adrenals/Urinary Tract: Adrenal glands appear normal. Left kidney and ureter are unremarkable. Minimal right hydroureteronephrosis is noted secondary to 3 mm calculus in proximal right ureter. Urinary bladder is unremarkable. Stomach/Bowel: The stomach appears normal. There is no evidence of bowel obstruction or inflammation. Status post appendectomy. Vascular/Lymphatic: No significant vascular findings are present. No enlarged abdominal or pelvic lymph nodes. Reproductive: Prostate is unremarkable. Other: No abdominal wall hernia or abnormality. No abdominopelvic ascites. Musculoskeletal: No acute or significant osseous findings. IMPRESSION: Fatty infiltration of the liver. Minimal right  hydroureteronephrosis is noted secondary to 3 mm proximal right ureteral calculus. Electronically Signed   By: Lupita Raider, M.D.   On: 01/24/2017 13:07    Procedures Procedures (including critical care time)  Medications Ordered in ED Medications  fentaNYL (SUBLIMAZE) injection 50 mcg (50 mcg Intravenous Given 01/24/17 1131)  fentaNYL (SUBLIMAZE) injection 100 mcg (100 mcg Intravenous Given 01/24/17 1300)  HYDROmorphone (DILAUDID) injection 1 mg (1 mg Intravenous Given 01/24/17 1438)  ketorolac (TORADOL) 30 MG/ML injection 30 mg (30 mg Intravenous Given 01/24/17 1545)     Initial Impression / Assessment and Plan / ED Course  I have reviewed the triage vital signs and the nursing notes.  Pertinent labs & imaging results that were available during my care of the patient were reviewed by me and considered in my medical decision making (see chart for details).      Patient Vitals for the past 24 hrs:  BP Temp Temp src Pulse Resp SpO2 Height Weight   01/24/17 1736 126/78 - - 79 16 99 % - -  01/24/17 1428 (!) 154/99 98.5 F (36.9 C) - 89 20 100 % - -  01/24/17 1029 - - - - - - 5\' 11"  (1.803 m) 117.9 kg (260 lb)  01/24/17 1025 (!) 147/94 98.5 F (36.9 C) Oral 66 18 95 % - -    15: 45-Case discussed with urology, Dr. Alvester Morin, who advises giving Toradol at this time, and follow-up in the office in a couple of days, assuming his pain can be controlled.  5:46 PM Reevaluation with update and discussion. After initial assessment and treatment, an updated evaluation reveals he is comfortable now states "I feel a lot better."  Findings discussed with patient, and a person with him, questions were answered. Andre Ford    Final Clinical Impressions(s) / ED Diagnoses   Final diagnoses:  Renal colic  Ureteral stone    Ureteral stone with colic, renal.  Doubt urinary tract infection, acute kidney injury, metabolic instability or impending vascular collapse.  Nursing Notes Reviewed/ Care Coordinated Applicable Imaging Reviewed Interpretation of Laboratory Data incorporated into ED treatment  The patient appears reasonably screened and/or stabilized for discharge and I doubt any other medical condition or other [facility] requiring further screening, evaluation, or treatment in the ED at this time prior to discharge.  Plan: Home Medications-continue usual home medications; Home Treatments-rest, fluids, strain urine; return here if the recommended treatment, does not improve the symptoms; Recommended follow up-neurology follow-up 2 or 3 days.   ED Discharge Orders        Ordered    oxyCODONE-acetaminophen (PERCOCET) 5-325 MG tablet  Every 4 hours PRN     01/24/17 1745    tamsulosin (FLOMAX) 0.4 MG CAPS capsule     01/24/17 1745       Andre Bale, MD 01/24/17 1747

## 2017-01-24 NOTE — ED Notes (Signed)
Pt reports that his pain is "not going away, but the medicine makes it to where [he] does not care about the pain."

## 2017-01-24 NOTE — Discharge Instructions (Signed)
Call your urologist, for a follow-up appointment in 2 or 3 days.  Strain your urine, to capture the stone.

## 2017-01-24 NOTE — ED Notes (Signed)
Patient wanting to use restroom. Will update vital signs once patient finishes.

## 2017-01-24 NOTE — ED Triage Notes (Signed)
Patient presents with c/o right flank/back pain. States he felt a small throbbing yesterday however today he was awaken this morning in severe pain. States when he tries to urinate only a little comes out. Denies any obvious signs of blood in urine. Denies fever or chills. Patient is A&O x4 and ambulatory.

## 2017-01-24 NOTE — ED Notes (Signed)
ED Provider at bedside. 

## 2017-01-24 NOTE — ED Notes (Signed)
Pt requesting an update from MD.  MD made aware

## 2017-01-24 NOTE — ED Notes (Signed)
Pt encouraged to attempt to urinate at this time.  Pt has a urinal and instructed to push call button when finished

## 2017-03-16 DIAGNOSIS — M2550 Pain in unspecified joint: Secondary | ICD-10-CM | POA: Diagnosis not present

## 2017-03-16 DIAGNOSIS — R0789 Other chest pain: Secondary | ICD-10-CM | POA: Diagnosis not present

## 2017-07-18 DIAGNOSIS — N4889 Other specified disorders of penis: Secondary | ICD-10-CM | POA: Diagnosis not present

## 2017-07-18 DIAGNOSIS — Z87710 Personal history of (corrected) hypospadias: Secondary | ICD-10-CM | POA: Diagnosis not present

## 2017-09-03 DIAGNOSIS — F411 Generalized anxiety disorder: Secondary | ICD-10-CM | POA: Diagnosis not present

## 2017-09-03 DIAGNOSIS — R7303 Prediabetes: Secondary | ICD-10-CM | POA: Diagnosis not present

## 2017-09-03 DIAGNOSIS — E781 Pure hyperglyceridemia: Secondary | ICD-10-CM | POA: Diagnosis not present

## 2017-09-03 DIAGNOSIS — M2550 Pain in unspecified joint: Secondary | ICD-10-CM | POA: Diagnosis not present

## 2017-09-18 DIAGNOSIS — E7849 Other hyperlipidemia: Secondary | ICD-10-CM | POA: Diagnosis not present

## 2017-09-18 DIAGNOSIS — I10 Essential (primary) hypertension: Secondary | ICD-10-CM | POA: Diagnosis not present

## 2017-09-18 DIAGNOSIS — E1165 Type 2 diabetes mellitus with hyperglycemia: Secondary | ICD-10-CM | POA: Diagnosis not present

## 2017-09-28 DIAGNOSIS — E119 Type 2 diabetes mellitus without complications: Secondary | ICD-10-CM | POA: Diagnosis not present

## 2017-09-28 DIAGNOSIS — L0390 Cellulitis, unspecified: Secondary | ICD-10-CM | POA: Diagnosis not present

## 2017-10-22 DIAGNOSIS — Z6835 Body mass index (BMI) 35.0-35.9, adult: Secondary | ICD-10-CM | POA: Diagnosis not present

## 2017-10-22 DIAGNOSIS — M791 Myalgia, unspecified site: Secondary | ICD-10-CM | POA: Diagnosis not present

## 2017-10-22 DIAGNOSIS — M6281 Muscle weakness (generalized): Secondary | ICD-10-CM | POA: Diagnosis not present

## 2017-10-30 DIAGNOSIS — M791 Myalgia, unspecified site: Secondary | ICD-10-CM | POA: Diagnosis not present

## 2017-10-30 DIAGNOSIS — E1165 Type 2 diabetes mellitus with hyperglycemia: Secondary | ICD-10-CM | POA: Diagnosis not present

## 2017-10-30 DIAGNOSIS — K861 Other chronic pancreatitis: Secondary | ICD-10-CM | POA: Diagnosis not present

## 2017-10-30 DIAGNOSIS — I10 Essential (primary) hypertension: Secondary | ICD-10-CM | POA: Diagnosis not present

## 2017-10-30 DIAGNOSIS — Z23 Encounter for immunization: Secondary | ICD-10-CM | POA: Diagnosis not present

## 2017-11-07 DIAGNOSIS — R079 Chest pain, unspecified: Secondary | ICD-10-CM | POA: Diagnosis not present

## 2017-11-07 DIAGNOSIS — E119 Type 2 diabetes mellitus without complications: Secondary | ICD-10-CM | POA: Diagnosis not present

## 2017-11-07 DIAGNOSIS — E781 Pure hyperglyceridemia: Secondary | ICD-10-CM | POA: Diagnosis not present

## 2017-11-07 DIAGNOSIS — Z0189 Encounter for other specified special examinations: Secondary | ICD-10-CM | POA: Diagnosis not present

## 2017-11-09 DIAGNOSIS — I10 Essential (primary) hypertension: Secondary | ICD-10-CM | POA: Diagnosis not present

## 2017-11-09 DIAGNOSIS — R0789 Other chest pain: Secondary | ICD-10-CM | POA: Diagnosis not present

## 2017-11-09 DIAGNOSIS — R9431 Abnormal electrocardiogram [ECG] [EKG]: Secondary | ICD-10-CM | POA: Diagnosis not present

## 2017-11-12 ENCOUNTER — Emergency Department (HOSPITAL_COMMUNITY): Payer: BLUE CROSS/BLUE SHIELD

## 2017-11-12 ENCOUNTER — Encounter (HOSPITAL_COMMUNITY)
Admission: EM | Disposition: A | Discharge status: Home / Self Care | Payer: Self-pay | Source: Home / Self Care | Attending: Thoracic Surgery (Cardiothoracic Vascular Surgery)

## 2017-11-12 ENCOUNTER — Ambulatory Visit (HOSPITAL_COMMUNITY): Payer: BLUE CROSS/BLUE SHIELD

## 2017-11-12 ENCOUNTER — Observation Stay (HOSPITAL_BASED_OUTPATIENT_CLINIC_OR_DEPARTMENT_OTHER): Payer: BLUE CROSS/BLUE SHIELD

## 2017-11-12 ENCOUNTER — Other Ambulatory Visit: Payer: Self-pay | Admitting: *Deleted

## 2017-11-12 ENCOUNTER — Inpatient Hospital Stay (HOSPITAL_COMMUNITY)
Admission: EM | Admit: 2017-11-12 | Discharge: 2017-11-21 | DRG: 234 | Disposition: A | Discharge status: Home / Self Care | Payer: BLUE CROSS/BLUE SHIELD | Attending: Thoracic Surgery (Cardiothoracic Vascular Surgery) | Admitting: Thoracic Surgery (Cardiothoracic Vascular Surgery)

## 2017-11-12 ENCOUNTER — Encounter (HOSPITAL_COMMUNITY): Payer: Self-pay | Admitting: Cardiology

## 2017-11-12 ENCOUNTER — Other Ambulatory Visit: Payer: Self-pay

## 2017-11-12 DIAGNOSIS — I25110 Atherosclerotic heart disease of native coronary artery with unstable angina pectoris: Principal | ICD-10-CM | POA: Diagnosis present

## 2017-11-12 DIAGNOSIS — N35819 Other urethral stricture, male, unspecified site: Secondary | ICD-10-CM | POA: Diagnosis present

## 2017-11-12 DIAGNOSIS — E669 Obesity, unspecified: Secondary | ICD-10-CM | POA: Diagnosis present

## 2017-11-12 DIAGNOSIS — E785 Hyperlipidemia, unspecified: Secondary | ICD-10-CM | POA: Diagnosis present

## 2017-11-12 DIAGNOSIS — I2510 Atherosclerotic heart disease of native coronary artery without angina pectoris: Secondary | ICD-10-CM | POA: Diagnosis present

## 2017-11-12 DIAGNOSIS — D62 Acute posthemorrhagic anemia: Secondary | ICD-10-CM | POA: Diagnosis not present

## 2017-11-12 DIAGNOSIS — Z01810 Encounter for preprocedural cardiovascular examination: Secondary | ICD-10-CM

## 2017-11-12 DIAGNOSIS — F419 Anxiety disorder, unspecified: Secondary | ICD-10-CM | POA: Diagnosis not present

## 2017-11-12 DIAGNOSIS — Z9049 Acquired absence of other specified parts of digestive tract: Secondary | ICD-10-CM | POA: Diagnosis not present

## 2017-11-12 DIAGNOSIS — K861 Other chronic pancreatitis: Secondary | ICD-10-CM | POA: Diagnosis not present

## 2017-11-12 DIAGNOSIS — Z7984 Long term (current) use of oral hypoglycemic drugs: Secondary | ICD-10-CM

## 2017-11-12 DIAGNOSIS — N35014 Post-traumatic urethral stricture, male, unspecified: Secondary | ICD-10-CM | POA: Diagnosis not present

## 2017-11-12 DIAGNOSIS — Z87891 Personal history of nicotine dependence: Secondary | ICD-10-CM

## 2017-11-12 DIAGNOSIS — E781 Pure hyperglyceridemia: Secondary | ICD-10-CM | POA: Diagnosis not present

## 2017-11-12 DIAGNOSIS — Z6835 Body mass index (BMI) 35.0-35.9, adult: Secondary | ICD-10-CM | POA: Diagnosis not present

## 2017-11-12 DIAGNOSIS — J90 Pleural effusion, not elsewhere classified: Secondary | ICD-10-CM | POA: Diagnosis not present

## 2017-11-12 DIAGNOSIS — E119 Type 2 diabetes mellitus without complications: Secondary | ICD-10-CM | POA: Diagnosis not present

## 2017-11-12 DIAGNOSIS — E109 Type 1 diabetes mellitus without complications: Secondary | ICD-10-CM | POA: Diagnosis present

## 2017-11-12 DIAGNOSIS — I200 Unstable angina: Secondary | ICD-10-CM | POA: Diagnosis not present

## 2017-11-12 DIAGNOSIS — I10 Essential (primary) hypertension: Secondary | ICD-10-CM | POA: Diagnosis present

## 2017-11-12 DIAGNOSIS — D696 Thrombocytopenia, unspecified: Secondary | ICD-10-CM | POA: Diagnosis not present

## 2017-11-12 DIAGNOSIS — J9811 Atelectasis: Secondary | ICD-10-CM | POA: Diagnosis present

## 2017-11-12 DIAGNOSIS — R079 Chest pain, unspecified: Secondary | ICD-10-CM | POA: Diagnosis not present

## 2017-11-12 DIAGNOSIS — R072 Precordial pain: Secondary | ICD-10-CM | POA: Diagnosis not present

## 2017-11-12 DIAGNOSIS — N320 Bladder-neck obstruction: Secondary | ICD-10-CM | POA: Diagnosis present

## 2017-11-12 DIAGNOSIS — Z951 Presence of aortocoronary bypass graft: Secondary | ICD-10-CM | POA: Diagnosis not present

## 2017-11-12 DIAGNOSIS — I208 Other forms of angina pectoris: Secondary | ICD-10-CM | POA: Diagnosis present

## 2017-11-12 DIAGNOSIS — I081 Rheumatic disorders of both mitral and tricuspid valves: Secondary | ICD-10-CM | POA: Diagnosis not present

## 2017-11-12 DIAGNOSIS — E8770 Fluid overload, unspecified: Secondary | ICD-10-CM | POA: Diagnosis present

## 2017-11-12 DIAGNOSIS — J939 Pneumothorax, unspecified: Secondary | ICD-10-CM | POA: Diagnosis not present

## 2017-11-12 HISTORY — DX: Atherosclerotic heart disease of native coronary artery without angina pectoris: I25.10

## 2017-11-12 HISTORY — DX: Other chronic pancreatitis: K86.1

## 2017-11-12 HISTORY — PX: LEFT HEART CATH AND CORONARY ANGIOGRAPHY: CATH118249

## 2017-11-12 HISTORY — DX: Depression, unspecified: F32.A

## 2017-11-12 HISTORY — DX: Anxiety disorder, unspecified: F41.9

## 2017-11-12 HISTORY — DX: Personal history of urinary calculi: Z87.442

## 2017-11-12 HISTORY — DX: Major depressive disorder, single episode, unspecified: F32.9

## 2017-11-12 HISTORY — DX: Type 2 diabetes mellitus without complications: E11.9

## 2017-11-12 HISTORY — DX: Pure hyperglyceridemia: E78.1

## 2017-11-12 LAB — CBC
HCT: 46.7 % (ref 39.0–52.0)
Hemoglobin: 16.1 g/dL (ref 13.0–17.0)
MCH: 29.7 pg (ref 26.0–34.0)
MCHC: 34.5 g/dL (ref 30.0–36.0)
MCV: 86.2 fL (ref 80.0–100.0)
NRBC: 0 % (ref 0.0–0.2)
PLATELETS: 190 10*3/uL (ref 150–400)
RBC: 5.42 MIL/uL (ref 4.22–5.81)
RDW: 13.4 % (ref 11.5–15.5)
WBC: 6.4 10*3/uL (ref 4.0–10.5)

## 2017-11-12 LAB — BASIC METABOLIC PANEL
Anion gap: 9 (ref 5–15)
BUN: 22 mg/dL — AB (ref 6–20)
CALCIUM: 9.4 mg/dL (ref 8.9–10.3)
CHLORIDE: 102 mmol/L (ref 98–111)
CO2: 25 mmol/L (ref 22–32)
CREATININE: 0.73 mg/dL (ref 0.61–1.24)
GFR calc Af Amer: >60 mL/min (ref >60)
Glucose, Bld: 153 mg/dL — ABNORMAL HIGH (ref 70–99)
POTASSIUM: 4.2 mmol/L (ref 3.5–5.1)
Sodium: 136 mmol/L (ref 135–145)

## 2017-11-12 LAB — HEPARIN LEVEL (UNFRACTIONATED): HEPARIN UNFRACTIONATED: 0.11 [IU]/mL — AB (ref 0.30–0.70)

## 2017-11-12 LAB — ECHOCARDIOGRAM COMPLETE
Height: 70 in
Weight: 3920 oz

## 2017-11-12 LAB — I-STAT TROPONIN, ED: TROPONIN I, POC: 0.01 ng/mL (ref 0.00–0.08)

## 2017-11-12 LAB — GLUCOSE, CAPILLARY
GLUCOSE-CAPILLARY: 155 mg/dL — AB (ref 70–99)
Glucose-Capillary: 99 mg/dL (ref 70–99)
Glucose-Capillary: 175 mg/dL — ABNORMAL HIGH (ref 70–99)

## 2017-11-12 SURGERY — LEFT HEART CATH AND CORONARY ANGIOGRAPHY
Anesthesia: LOCAL

## 2017-11-12 MED ORDER — VERAPAMIL HCL 2.5 MG/ML IV SOLN
INTRAVENOUS | Status: AC
  Filled 2017-11-12: qty 2

## 2017-11-12 MED ORDER — FENTANYL CITRATE (PF) 100 MCG/2ML IJ SOLN
INTRAMUSCULAR | Status: DC | PRN
  Administered 2017-11-12: 50 ug via INTRAVENOUS

## 2017-11-12 MED ORDER — ACETAMINOPHEN 325 MG PO TABS
650.0000 mg | ORAL_TABLET | ORAL | Status: DC | PRN
  Administered 2017-11-12: 650 mg via ORAL
  Filled 2017-11-12: qty 2

## 2017-11-12 MED ORDER — LIDOCAINE HCL (PF) 1 % IJ SOLN
INTRAMUSCULAR | Status: AC
  Filled 2017-11-12: qty 30

## 2017-11-12 MED ORDER — SODIUM CHLORIDE 0.9% FLUSH
3.0000 mL | INTRAVENOUS | Status: DC | PRN
  Administered 2017-11-14: 3 mL via INTRAVENOUS
  Filled 2017-11-12: qty 3

## 2017-11-12 MED ORDER — HEPARIN BOLUS VIA INFUSION
4000.0000 [IU] | Freq: Once | INTRAVENOUS | Status: AC
  Administered 2017-11-12: 4000 [IU] via INTRAVENOUS
  Filled 2017-11-12: qty 4000

## 2017-11-12 MED ORDER — MIDAZOLAM HCL 2 MG/2ML IJ SOLN
INTRAMUSCULAR | Status: AC
  Filled 2017-11-12: qty 2

## 2017-11-12 MED ORDER — SODIUM CHLORIDE 0.9 % IV SOLN: INTRAVENOUS | Status: AC

## 2017-11-12 MED ORDER — NITROGLYCERIN 0.4 MG SL SUBL: 0.4000 mg | SUBLINGUAL_TABLET | SUBLINGUAL | Status: DC | PRN

## 2017-11-12 MED ORDER — HEPARIN (PORCINE) IN NACL 100-0.45 UNIT/ML-% IJ SOLN
1300.0000 [IU]/h | INTRAMUSCULAR | Status: DC
  Administered 2017-11-12: 1300 [IU]/h via INTRAVENOUS
  Filled 2017-11-12: qty 250

## 2017-11-12 MED ORDER — SODIUM CHLORIDE 0.9 % IV SOLN: 250.0000 mL | INTRAVENOUS | Status: DC | PRN

## 2017-11-12 MED ORDER — ROSUVASTATIN CALCIUM 10 MG PO TABS
10.0000 mg | ORAL_TABLET | Freq: Every day | ORAL | Status: DC
  Administered 2017-11-12 – 2017-11-20 (×9): 10 mg via ORAL
  Filled 2017-11-12 (×8): qty 1

## 2017-11-12 MED ORDER — IOHEXOL 350 MG/ML SOLN
INTRAVENOUS | Status: DC | PRN
  Administered 2017-11-12: 75 mL via INTRA_ARTERIAL

## 2017-11-12 MED ORDER — HEPARIN (PORCINE) IN NACL 100-0.45 UNIT/ML-% IJ SOLN
1850.0000 [IU]/h | INTRAMUSCULAR | Status: DC
  Administered 2017-11-12: 1400 [IU]/h via INTRAVENOUS
  Administered 2017-11-13: 1650 [IU]/h via INTRAVENOUS
  Administered 2017-11-13 – 2017-11-16 (×3): 1850 [IU]/h via INTRAVENOUS
  Filled 2017-11-12 (×6): qty 250

## 2017-11-12 MED ORDER — ASPIRIN 81 MG PO CHEW
81.0000 mg | CHEWABLE_TABLET | Freq: Every day | ORAL | Status: DC
  Administered 2017-11-13 – 2017-11-15 (×3): 81 mg via ORAL
  Filled 2017-11-12 (×3): qty 1

## 2017-11-12 MED ORDER — MIDAZOLAM HCL 2 MG/2ML IJ SOLN
INTRAMUSCULAR | Status: DC | PRN
  Administered 2017-11-12: 1 mg via INTRAVENOUS

## 2017-11-12 MED ORDER — MORPHINE SULFATE (PF) 4 MG/ML IV SOLN
4.0000 mg | Freq: Once | INTRAVENOUS | Status: AC
  Administered 2017-11-12: 4 mg via INTRAVENOUS
  Filled 2017-11-12: qty 1

## 2017-11-12 MED ORDER — HEPARIN SODIUM (PORCINE) 1000 UNIT/ML IJ SOLN
INTRAMUSCULAR | Status: DC | PRN
  Administered 2017-11-12: 6000 [IU] via INTRAVENOUS

## 2017-11-12 MED ORDER — HEPARIN (PORCINE) IN NACL 1000-0.9 UT/500ML-% IV SOLN
INTRAVENOUS | Status: AC
  Filled 2017-11-12: qty 1000

## 2017-11-12 MED ORDER — OMEGA-3-ACID ETHYL ESTERS 1 G PO CAPS
2.0000 g | ORAL_CAPSULE | Freq: Two times a day (BID) | ORAL | Status: DC
  Administered 2017-11-12 – 2017-11-15 (×7): 2 g via ORAL
  Filled 2017-11-12 (×7): qty 2

## 2017-11-12 MED ORDER — SODIUM CHLORIDE 0.9 % IV SOLN
250.0000 mL | INTRAVENOUS | Status: DC | PRN
  Administered 2017-11-16: 16:00:00 via INTRAVENOUS

## 2017-11-12 MED ORDER — HEPARIN (PORCINE) IN NACL 1000-0.9 UT/500ML-% IV SOLN
INTRAVENOUS | Status: DC | PRN
  Administered 2017-11-12 (×2): 500 mL

## 2017-11-12 MED ORDER — ALPRAZOLAM 0.5 MG PO TABS
0.5000 mg | ORAL_TABLET | Freq: Two times a day (BID) | ORAL | Status: DC | PRN
  Administered 2017-11-12 – 2017-11-15 (×5): 0.5 mg via ORAL
  Filled 2017-11-12 (×5): qty 1

## 2017-11-12 MED ORDER — FENTANYL CITRATE (PF) 100 MCG/2ML IJ SOLN
INTRAMUSCULAR | Status: AC
  Filled 2017-11-12: qty 2

## 2017-11-12 MED ORDER — ASPIRIN 81 MG PO CHEW: 81.0000 mg | CHEWABLE_TABLET | ORAL | Status: DC

## 2017-11-12 MED ORDER — ISOSORBIDE MONONITRATE ER 30 MG PO TB24
30.0000 mg | ORAL_TABLET | Freq: Every day | ORAL | Status: DC
  Administered 2017-11-12 – 2017-11-15 (×4): 30 mg via ORAL
  Filled 2017-11-12 (×5): qty 1

## 2017-11-12 MED ORDER — SODIUM CHLORIDE 0.9% FLUSH: 3.0000 mL | INTRAVENOUS | Status: DC | PRN

## 2017-11-12 MED ORDER — LIDOCAINE HCL (PF) 1 % IJ SOLN
INTRAMUSCULAR | Status: DC | PRN
  Administered 2017-11-12: 2 mL

## 2017-11-12 MED ORDER — HEPARIN SODIUM (PORCINE) 1000 UNIT/ML IJ SOLN
INTRAMUSCULAR | Status: AC
  Filled 2017-11-12: qty 1

## 2017-11-12 MED ORDER — ASPIRIN 325 MG PO TABS
325.0000 mg | ORAL_TABLET | Freq: Once | ORAL | Status: AC
  Administered 2017-11-12: 325 mg via ORAL
  Filled 2017-11-12: qty 1

## 2017-11-12 MED ORDER — SODIUM CHLORIDE 0.9% FLUSH
3.0000 mL | Freq: Two times a day (BID) | INTRAVENOUS | Status: DC
  Administered 2017-11-14 – 2017-11-15 (×4): 3 mL via INTRAVENOUS

## 2017-11-12 MED ORDER — ONDANSETRON HCL 4 MG/2ML IJ SOLN: 4.0000 mg | Freq: Four times a day (QID) | INTRAMUSCULAR | Status: DC | PRN

## 2017-11-12 MED ORDER — SODIUM CHLORIDE 0.9% FLUSH
3.0000 mL | Freq: Two times a day (BID) | INTRAVENOUS | Status: DC
  Administered 2017-11-12: 3 mL via INTRAVENOUS

## 2017-11-12 MED ORDER — SODIUM CHLORIDE 0.9 % IV SOLN
INTRAVENOUS | Status: DC
  Administered 2017-11-12: 12:00:00 via INTRAVENOUS

## 2017-11-12 MED ORDER — OXYCODONE HCL 5 MG PO TABS
5.0000 mg | ORAL_TABLET | Freq: Once | ORAL | Status: AC
  Administered 2017-11-12: 5 mg via ORAL
  Filled 2017-11-12: qty 1

## 2017-11-12 MED ORDER — VERAPAMIL HCL 2.5 MG/ML IV SOLN
INTRAVENOUS | Status: DC | PRN
  Administered 2017-11-12: 10 mL via INTRA_ARTERIAL

## 2017-11-12 SURGICAL SUPPLY — 14 items
CATH INFINITI 5 FR JL3.5 (CATHETERS) ×1 IMPLANT
CATH LAUNCHER 5F EBU3.0 (CATHETERS) IMPLANT
CATH OPTITORQUE TIG 4.0 5F (CATHETERS) ×1 IMPLANT
CATHETER LAUNCHER 5F EBU3.0 (CATHETERS) ×2
DEVICE RAD COMP TR BAND LRG (VASCULAR PRODUCTS) ×1 IMPLANT
GLIDESHEATH SLEND A-KIT 6F 22G (SHEATH) ×1 IMPLANT
GUIDEWIRE INQWIRE 1.5J.035X260 (WIRE) IMPLANT
INQWIRE 1.5J .035X260CM (WIRE) ×2
KIT HEART LEFT (KITS) ×2 IMPLANT
PACK CARDIAC CATHETERIZATION (CUSTOM PROCEDURE TRAY) ×2 IMPLANT
PROTECTION STATION PRESSURIZED (MISCELLANEOUS) ×2
STATION PROTECTION PRESSURIZED (MISCELLANEOUS) IMPLANT
TRANSDUCER W/STOPCOCK (MISCELLANEOUS) ×2 IMPLANT
TUBING CIL FLEX 10 FLL-RA (TUBING) ×2 IMPLANT

## 2017-11-12 NOTE — ED Provider Notes (Signed)
[facility] EMERGENCY DEPARTMENT Provider Note   CSN: [id number] Arrival date & time: 11/12/17  0707     History   Chief Complaint Chief Complaint  Patient presents with  . Chest Pain  . Jaw Pain    HPI Andre Ford is a 37 y.o. male with diabetes, HLD, HTN, and obesity who presents for evaluation of chest pain. He has a history of pain with exertion located on his back between his shoulder blades, radiating to bilateral arms and low back. Yesterday, he began experiencing left jaw pain after dinner. It felt like a muscle ache. He went to bed with it hurting and woke up this morning with left shoulder aching pain with numbness radiating down his left arm and a sharp left chest pain located beneath his left breast. The chest pain was not pleuritic in nature. He took a nitroglycerin which improved the chest/shoulder pain and arm numbness. Then he took a Xanax.   He also reports associated diaphoresis, nausea, indigestion. Denies vomiting, sob, lower extremity swelling, calf pain, fever, or cough.   Currently, he is only experiencing left jaw/neck pain which is "achy" in nature.   He is a Naval architect. Denies recent illness or surgery. Denies family history of heart attack or stroke.   His cardiologist is Dr. Jacinto Halim and he has a heart cath scheduled for tomorrow.   HPI  Past Medical History:  Diagnosis Date  . Arthritis   . At risk for sleep apnea    STOP-BANG= 6     SENT TO PCP 08-05-2014  . Hyperlipidemia   . Hypertension   . Obesity   . Pancreatitis   . Urethral stricture     Patient Active Problem List   Diagnosis Date Noted  . Angina at rest [facility]) 11/12/2017  . Hypercholesterolemia with hypertriglyceridemia 10/14/2014  . Type 2 diabetes mellitus with hyperglycemia (HCC) 10/14/2014  . Morbid obesity (HCC) 10/14/2014  . Acute pancreatitis   . Pancreatitis 10/12/2014  . Hepatic steatosis 10/12/2014  . Alcohol abuse, episodic 10/12/2014  . Obesity  10/12/2014    Past Surgical History:  Procedure Laterality Date  . APPENDECTOMY  age 89  . CYSTOSCOPY WITH URETHRAL DILATATION N/A 08/10/2014   Procedure: CYSTOSCOPY WITH URETHRAL DILATATION;  Surgeon: Malen Gauze, MD;  Location: [facility];  Service: Urology;  Laterality: N/A;  . KNEE ARTHROSCOPY WITH ANTERIOR CRUCIATE LIGAMENT (ACL) REPAIR Left 2000  . ORIF RIGHT HAND FX  2000  . PENILE BIOPSY N/A 08/10/2014   Procedure: Core biopsy of penile glands lesion;  Surgeon: Malen Gauze, MD;  Location: [facility];  Service: Urology;  Laterality: N/A;        Home Medications    Prior to Admission medications   Medication Sig Start Date End Date Taking? Authorizing Provider  cephALEXin (KEFLEX) 500 MG capsule Take 500 mg by mouth 2 (two) times daily.    [provider]  gemfibrozil (LOPID) 600 MG tablet Take 1 tablet (600 mg total) by mouth daily. 10/14/14   Catarina Hartshorn, MD  metFORMIN (GLUCOPHAGE) 500 MG tablet Take 1 tablet (500 mg total) 2 (two) times daily by mouth. 12/14/16   Ward, Layla Maw, DO  ondansetron (ZOFRAN ODT) 4 MG disintegrating tablet Take 1 tablet (4 mg total) every 8 (eight) hours as needed by mouth for nausea or vomiting. Patient not taking: Reported on 01/24/2017 12/14/16   Ward, Layla Maw, DO  oxyCODONE-acetaminophen (PERCOCET) 5-325 MG tablet Take 1  tablet by mouth every 4 (four) hours as needed for severe pain. 01/24/17   Mancel Bale, MD  tamsulosin [facility]) 0.4 MG CAPS capsule 1 q HS to aid stone passage 01/24/17   Mancel Bale, MD    Family History No family history on file.  Social History Social History   Tobacco Use  . Smoking status: Former Smoker    Years: 10.00    Types: Cigarettes    Last attempt to quit: 08/05/2011    Years since quitting: 6.2  . Smokeless tobacco: Never Used  Substance Use Topics  . Alcohol use: Yes    Comment: occasional  . Drug use: No     Allergies   Patient has no  known allergies.   Review of Systems Review of Systems See HPI  Physical Exam Updated Vital Signs BP 118/77   Pulse 78   Temp 98 F (36.7 C)   Resp 18   SpO2 96%   Physical Exam Vitals:   11/12/17 0713 11/12/17 0745 11/12/17 0800  BP: 115/81 114/85 118/77  Pulse: 88 84 78  Resp: 18 11 18   Temp: 98 F (36.7 C)    SpO2: 99% 95% 96%   General: Vital signs reviewed.  Patient is well-developed and well-nourished, in no acute distress and cooperative with exam.   Neck: Supple, trachea midline, normal ROM, no JVD Cardiovascular: RRR, S1 normal, S2 normal, no murmurs, gallops, or rubs. Pulmonary/Chest: Clear to auscultation bilaterally, no wheezes, rales, or rhonchi. Abdominal: Soft, non-tender, non-distended, BS +, no guarding present.  Extremities: No lower extremity edema bilaterally,  pulses symmetric and intact bilaterally.  Neurological: A&O x3, Strength is normal and symmetric bilaterally, cranial nerve II-XII are grossly intact, no focal motor deficit, sensory intact to light touch bilaterally.  Skin: Warm, dry and intact. No rashes or erythema. Psychiatric: Normal mood and affect. speech and behavior is normal. Cognition and memory are normal.    ED Treatments / Results  Labs (all labs ordered are listed, but only abnormal results are displayed) Labs Reviewed  BASIC METABOLIC PANEL - Abnormal; Notable for the following components:      Result Value   Glucose, Bld 153 (*)    BUN 22 (*)    All other components within normal limits  CBC  I-STAT TROPONIN, ED    EKG EKG Interpretation  Date/Time:  Monday November 12 2017 07:12:36 EDT Ventricular Rate:  85 PR Interval:  156 QRS Duration: 88 QT Interval:  352 QTC Calculation: 418 R Axis:   76 Text Interpretation:  Normal sinus rhythm Nonspecific T wave abnormality No significant change since last tracing Confirmed by Cathren Laine ([id number]) on 11/12/2017 7:23:08 AM Also confirmed by Cathren Laine ([id number]), editor  Elita Quick [id number])  on 11/12/2017 8:26:50 AM   Radiology Dg Chest 2 View  Result Date: 11/12/2017 CLINICAL DATA:  Chest pain. EXAM: CHEST - 2 VIEW COMPARISON:  01/07/2005. FINDINGS: Trachea is midline. Heart size normal. Lungs are clear. No pleural fluid. IMPRESSION: No acute findings. Electronically Signed   By: Leanna Battles M.D.   On: 11/12/2017 08:20    Procedures Procedures (including critical care time)  Medications Ordered in ED Medications  nitroGLYCERIN (NITROSTAT) SL tablet 0.4 mg (has no administration in time range)  morphine 4 MG/ML injection 4 mg (has no administration in time range)  aspirin tablet 325 mg (325 mg Oral Given 11/12/17 0820)     Initial Impression / Assessment and Plan / ED Course  I have reviewed the triage  vital signs and the nursing notes.  Pertinent labs & imaging results that were available during my care of the patient were reviewed by me and considered in my medical decision making (see chart for details).  37yo male with chest pain. Risk factors include obesity, diabetes, HTN, HLD. Hemodynamically stable. Most likely ACS. Pain is not typically for aortic dissection, radial pulses and strong and symmetric bilaterally. 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   EKG shows t wave inversion in lead III, unchanged from prior. No ST changes, no q waves. Initial troponin is 0.01.  After, phone consult with Dr. Jacinto Halim, we will start IV heparin and admit patient.   Final Clinical Impressions(s) / ED Diagnoses   Final diagnoses:  Angina at rest [facility])  Precordial chest pain    ED Discharge Orders    None       Ali Lowe, MD 11/12/17 1001    Cathren Laine, MD 11/12/17 1157

## 2017-11-12 NOTE — ED Notes (Signed)
ED Provider at bedside. 

## 2017-11-12 NOTE — Progress Notes (Signed)
ANTICOAGULATION CONSULT NOTE  Pharmacy Consult for Heparin Indication: multivessel CAD  Heparin Dosing Weight: 97.2 kg  Labs: Recent Labs    11/12/17 0720 11/12/17 2254  HGB 16.1  --   HCT 46.7  --   PLT 190  --   HEPARINUNFRC  --  0.11*  CREATININE 0.73  --     Assessment: 37 yom presenting with jaw pain, CP, shoulder/back pain. Not on anticoagulation PTA. Pt was previously scheduled for heart cath tomorrow but had it today; showed multivessel CAD and consulting TCTS for possible CABG.  Pharmacy consulted to resume heparin drip 1 hr post TR band deflation.  TR band deflated ~15:30 per RN. No bleeding noted.  Initial heparin level after re-start is low at 0.11, no issues per RN.   Goal of Therapy:  Heparin level 0.3-0.7 units/ml Monitor platelets by anticoagulation protocol: Yes   Plan:  No bolus s/p cath Inc heparin to 1650 units/hr 0700 HL Daily heparin level/CBC Monitor for s/sx bleeding  Abran Duke, PharmD, BCPS Clinical Pharmacist Phone: [phone]

## 2017-11-12 NOTE — ED Notes (Addendum)
Patient transported to X-ray 

## 2017-11-12 NOTE — Progress Notes (Signed)
  Echocardiogram 2D Echocardiogram has been performed.  Celene Skeen 11/12/2017, 5:03 PM

## 2017-11-12 NOTE — Progress Notes (Addendum)
ANTICOAGULATION CONSULT NOTE  Pharmacy Consult for heparin Indication: multivessel CAD  Heparin Dosing Weight: 97.2 kg  Labs: Recent Labs    11/12/17 0720  HGB 16.1  HCT 46.7  PLT 190  CREATININE 0.73    Assessment: 37 yom presenting with jaw pain, CP, shoulder/back pain. Not on anticoagulation PTA. Pt was previously scheduled for heart cath tomorrow but had it today; showed multivessel CAD and consulting TCTS for possible CABG.  Pharmacy consulted to resume heparin drip 1 hr post TR band deflation.  TR band deflated ~15:30 per RN. No bleeding noted.  Goal of Therapy:  Heparin level 0.3-0.7 units/ml Monitor platelets by anticoagulation protocol: Yes   Plan:  Resume heparin drip at 1400 units/hr with no bolus 6 hr heparin level Daily heparin level/CBC Monitor for s/sx bleeding   Loura Back, PharmD, BCPS Clinical Pharmacist Clinical phone for 11/12/2017 until 10p is [phone] Please check AMION for all Pharmacist numbers by unit 11/12/2017 3:56 PM

## 2017-11-12 NOTE — Progress Notes (Signed)
Pre-op Cardiac Surgery  Carotid Findings:   Right Carotid:Velocities in the right ICA are consistent with a 40-59% stenosis.  Left Carotid: Velocities in the left ICA are consistent with a 1-39% stenosis. Velocities may underestimate degree of stenosis due to more proximal obstruction.   Vertebrals:Bilateral vertebral arteries demonstrate antegrade flow.   Upper Extremity Right Left  Brachial Pressures 120 119  Radial Waveforms Triphasic Biphasic  Ulnar Waveforms Biphasic Triphasic  Palmar Arch (Allen's Test) See below See below   Findings:  Right Upper Extremity: Doppler waveforms remain within normal limits with right radial compression. Doppler waveforms decrease 50% with right ulnar compression.  Left Upper Extremity: Doppler waveforms remain within normal limits with left radial compression. Doppler waveforms decrease 50% with left ulnar compression.   Lower  Extremity Right Left  Dorsalis Pedis 151 146  Posterior Tibial 148 143  Ankle/Brachial Indices 1.26 1.21  Findings:   Resting bilateral ankle-brachial indecis are within normal range. No evidence of significant bilateral lower extremity arterial disease.   Hongying Richardson Dopp (RDMS RVT) 11/12/17 6:45 PM

## 2017-11-12 NOTE — Progress Notes (Signed)
ANTICOAGULATION CONSULT NOTE  Pharmacy Consult for heparin Indication: chest pain/ACS  Heparin Dosing Weight: 97.2 kg  Labs: Recent Labs    11/12/17 0720  HGB 16.1  HCT 46.7  PLT 190  CREATININE 0.73    Assessment: 37 yom presenting with jaw pain, CP, shoulder/back pain. Pt was previously scheduled for heart cath tomorrow. Not on anticoagulation PTA. Pharmacy consulted to dose heparin for ACS. CBC wnl. No bleed documented. Per patient, he weighed 245 lbs 2 days ago.  Goal of Therapy:  Heparin level 0.3-0.7 units/ml Monitor platelets by anticoagulation protocol: Yes   Plan:  Heparin 4000 unit bolus Start heparin at 1300 units/h 6h heparin level Daily heparin level/CBC Monitor s/sx bleeding  Babs Bertin, PharmD, BCPS Clinical Pharmacist 11/12/2017 9:51 AM

## 2017-11-12 NOTE — H&P (Signed)
Andre Ford is an 37 y.o. male.   Chief Complaint: Chest pain  HPI:   37 y/o Caucasian male with type 1 DM, hypertriglyceridemia, h/o pancreatitis, prior h/o tobacco and alcohol abuse, with recurrent arm and neck pain episodes with diaphoresis, on exertion. He recently underwent exercise nuclear stress test that showed severe inferior, inferolateral ischemia. Patient was scheduled for cath on 11/12/2017. However, he presented with arm, neck, and chest pain at rest, responding to SL NTG. EKG shows inferior T wave inversion.  Past Medical History:  Diagnosis Date  . Arthritis   . At risk for sleep apnea    STOP-BANG= 6     SENT TO PCP 08-05-2014  . Hyperlipidemia   . Hypertension   . Obesity   . Pancreatitis   . Urethral stricture     Past Surgical History:  Procedure Laterality Date  . APPENDECTOMY  age 10  . CYSTOSCOPY WITH URETHRAL DILATATION N/A 08/10/2014   Procedure: CYSTOSCOPY WITH URETHRAL DILATATION;  Surgeon: Cleon Gustin, MD;  Location: [facility];  Service: Urology;  Laterality: N/A;  . KNEE ARTHROSCOPY WITH ANTERIOR CRUCIATE LIGAMENT (ACL) REPAIR Left 2000  . ORIF RIGHT HAND FX  2000  . PENILE BIOPSY N/A 08/10/2014   Procedure: Core biopsy of penile glands lesion;  Surgeon: Cleon Gustin, MD;  Location: [facility];  Service: Urology;  Laterality: N/A;    History reviewed. No pertinent family history. Social History:  reports that he quit smoking about 6 years ago. His smoking use included cigarettes. He quit after 10.00 years of use. He has never used smokeless tobacco. He reports that he drinks alcohol. He reports that he does not use drugs.  Allergies: No Known Allergies   (Not in a hospital admission)  Results for orders placed or performed during the hospital encounter of 11/12/17 (from the past 48 hour(s))  Basic metabolic panel     Status: Abnormal   Collection Time: 11/12/17  7:20 AM  Result Value Ref Range    Sodium 136 135 - 145 mmol/L   Potassium 4.2 3.5 - 5.1 mmol/L   Chloride 102 98 - 111 mmol/L   CO2 25 22 - 32 mmol/L   Glucose, Bld 153 (H) 70 - 99 mg/dL   BUN 22 (H) 6 - 20 mg/dL   Creatinine, Ser 0.73 0.61 - 1.24 mg/dL   Calcium 9.4 8.9 - 10.3 mg/dL   GFR calc non Af Amer >60 >60 mL/min   GFR calc Af Amer >60 >60 mL/min    Comment: (NOTE) The eGFR has been calculated using the CKD EPI equation. This calculation has not been validated in all clinical situations. eGFR's persistently <60 mL/min signify possible Chronic Kidney Disease.    Anion gap 9 5 - 15    Comment: Performed at [facility] [address] [place], [place] [address]  CBC     Status: None   Collection Time: 11/12/17  7:20 AM  Result Value Ref Range   WBC 6.4 4.0 - 10.5 K/uL   RBC 5.42 4.22 - 5.81 MIL/uL   Hemoglobin 16.1 13.0 - 17.0 g/dL   HCT 46.7 39.0 - 52.0 %   MCV 86.2 80.0 - 100.0 fL   MCH 29.7 26.0 - 34.0 pg   MCHC 34.5 30.0 - 36.0 g/dL   RDW 13.4 11.5 - 15.5 %   Platelets 190 150 - 400 K/uL   nRBC 0.0 0.0 - 0.2 %    Comment:  Performed at [facility], [place] [address], [place], [place] [address]  I-stat troponin, ED     Status: None   Collection Time: 11/12/17  7:23 AM  Result Value Ref Range   Troponin i, poc 0.01 0.00 - 0.08 ng/mL   Comment 3            Comment: Due to the release kinetics of cTnI, a negative result within the first hours of the onset of symptoms does not rule out myocardial infarction with certainty. If myocardial infarction is still suspected, repeat the test at appropriate intervals.    Dg Chest 2 View  Result Date: 11/12/2017 CLINICAL DATA:  Chest pain. EXAM: CHEST - 2 VIEW COMPARISON:  01/07/2005. FINDINGS: Trachea is midline. Heart size normal. Lungs are clear. No pleural fluid. IMPRESSION: No acute findings. Electronically Signed   By: Lorin Picket M.D.   On: 11/12/2017 08:20    Review of Systems  Constitutional: Negative.   HENT: Negative.    Respiratory: Negative for shortness of breath.   Cardiovascular: Positive for chest pain. Negative for leg swelling and PND.  Gastrointestinal: Negative.   Genitourinary: Negative.   Musculoskeletal: Negative.   Skin: Negative.   Neurological: Negative.  Negative for dizziness and loss of consciousness.  Endo/Heme/Allergies: Does not bruise/bleed easily.       Diabetes, hypertriglyceridemia  Psychiatric/Behavioral: Negative.   All other systems reviewed and are negative.   Blood pressure 127/89, pulse 79, temperature 98 F (36.7 C), resp. rate 13, height '5\' 10"'$  (1.778 m), weight 111.1 kg, SpO2 97 %. Physical Exam  Nursing note and vitals reviewed. Constitutional: He is oriented to person, place, and time. He appears well-developed and well-nourished.  HENT:  Head: Normocephalic and atraumatic.  Eyes: Pupils are equal, round, and reactive to light. Conjunctivae are normal.  Neck: Normal range of motion. Neck supple. JVD present.  Cardiovascular: Normal rate, regular rhythm and normal heart sounds.  No murmur heard. Respiratory: Effort normal and breath sounds normal. He has no wheezes. He has no rales.  GI: Soft. Bowel sounds are normal. There is no tenderness. There is no rebound.  Musculoskeletal: He exhibits no edema.  Lymphadenopathy:    He has no cervical adenopathy.  Neurological: He is alert and oriented to person, place, and time. No cranial nerve deficit.  Skin: Skin is warm and dry.  Psychiatric: He has a normal mood and affect.     Assessment:  37 y/o Caucasian male with type 1 DM, hypertriglyceridemia, h/o pancreatitis, prior h/o tobacco and alcohol abuse, recent abnormal stress test with severe inferior, inferolateral ischemia, now admitted with unstable angina  Unstable angina Hypertension Type 2 DM Hypertriglyceridemia  Plan: Plan for cath today. 325 mg ASA Further antiplatelet, anticoagulation therapy after cath Echocardiogram  Nigel Mormon,  MD 11/12/2017, 11:04 AM  Nigel Mormon, MD [facility] [place] Pager: [phone] Office: [phone] If no answer Cell [phone]

## 2017-11-12 NOTE — Interval H&P Note (Signed)
History and Physical Interval Note:  11/12/2017 12:13 PM  Andre Ford  has presented today for surgery, with the diagnosis of abn stress   cp  The various methods of treatment have been discussed with the patient and family. After consideration of risks, benefits and other options for treatment, the patient has consented to  Procedure(s): LEFT HEART CATH AND CORONARY ANGIOGRAPHY (N/A) as a surgical intervention .  The patient's history has been reviewed, patient examined, no change in status, stable for surgery.  I have reviewed the patient's chart and labs.  Questions were answered to the patient's satisfaction.    2016 Appropriate Use Criteria for Coronary Revascularization in Patients With Acute Coronary Syndrome NSTEMI/UA Intermediate Risk (TIMI Score 3-4) NSTEMI/Unstable angina, stabilized patient at Intermediate Risk (TIMI Score 3-4) Link Here: [employer].es Indication:  Revascularization by PCI or CABG of 1 or more arteries in a patient with NSTEMI or unstable angina with Stabilization after presentation Intermediate risk for clinical events  A (7) Indication: 16; Score 7     [name] J Jessice Madill

## 2017-11-12 NOTE — ED Triage Notes (Signed)
Pt is scheduled for a heart cath tomorrow. Last night he began having left jaw pain This morning at 6am he began having left chest and shoulder pain/back. Pt states the pain "really is not that bad right now." Took a nitro this morning but states it only gave him a headache.

## 2017-11-13 ENCOUNTER — Encounter (HOSPITAL_COMMUNITY): Payer: Self-pay | Admitting: Cardiology

## 2017-11-13 ENCOUNTER — Ambulatory Visit (HOSPITAL_COMMUNITY): Admission: RE | Admit: 2017-11-13 | Payer: BLUE CROSS/BLUE SHIELD | Source: Ambulatory Visit | Admitting: Cardiology

## 2017-11-13 DIAGNOSIS — I25110 Atherosclerotic heart disease of native coronary artery with unstable angina pectoris: Secondary | ICD-10-CM

## 2017-11-13 DIAGNOSIS — E119 Type 2 diabetes mellitus without complications: Secondary | ICD-10-CM | POA: Diagnosis not present

## 2017-11-13 DIAGNOSIS — I10 Essential (primary) hypertension: Secondary | ICD-10-CM | POA: Diagnosis not present

## 2017-11-13 LAB — CBC
HEMATOCRIT: 42.9 % (ref 39.0–52.0)
HEMOGLOBIN: 14.3 g/dL (ref 13.0–17.0)
MCH: 28.8 pg (ref 26.0–34.0)
MCHC: 33.3 g/dL (ref 30.0–36.0)
MCV: 86.3 fL (ref 80.0–100.0)
NRBC: 0 % (ref 0.0–0.2)
Platelets: 157 10*3/uL (ref 150–400)
RBC: 4.97 MIL/uL (ref 4.22–5.81)
RDW: 13.6 % (ref 11.5–15.5)
WBC: 6.1 10*3/uL (ref 4.0–10.5)

## 2017-11-13 LAB — GLUCOSE, CAPILLARY
GLUCOSE-CAPILLARY: 107 mg/dL — AB (ref 70–99)
GLUCOSE-CAPILLARY: 123 mg/dL — AB (ref 70–99)
GLUCOSE-CAPILLARY: 150 mg/dL — AB (ref 70–99)
GLUCOSE-CAPILLARY: 131 mg/dL — AB (ref 70–99)

## 2017-11-13 LAB — HEPARIN LEVEL (UNFRACTIONATED)
HEPARIN UNFRACTIONATED: 0.33 [IU]/mL (ref 0.30–0.70)
Heparin Unfractionated: 0.24 IU/mL — ABNORMAL LOW (ref 0.30–0.70)

## 2017-11-13 MED ORDER — HYDROCODONE-ACETAMINOPHEN 5-325 MG PO TABS
1.0000 | ORAL_TABLET | Freq: Once | ORAL | Status: AC
  Administered 2017-11-13: 1 via ORAL
  Filled 2017-11-13: qty 1

## 2017-11-13 NOTE — Progress Notes (Signed)
Subjective:  Denies chest pain  Objective:  Vital Signs in the last 24 hours: Temp:  [97.7 F (36.5 C)-98.7 F (37.1 C)] 98.7 F (37.1 C) (10/15 1119) Pulse Rate:  [73-89] 77 (10/15 1119) Resp:  [15] 15 (10/14 1510) BP: (101-143)/(71-80) 115/73 (10/15 1119) SpO2:  [95 %-99 %] 96 % (10/15 1119) Weight:  [109 kg] 109 kg (10/15 0608)  Intake/Output from previous day: 10/14 0701 - 10/15 0700 In: 658.6 [I.V.:658.6] Out: 650 [Urine:650] Intake/Output from this shift: Total I/O In: 369.6 [P.O.:220; I.V.:149.6] Out: -   Physical Exam: Nursing note and vitals reviewed. Constitutional: He is oriented to person, place, and time. He appears well-developed and well-nourished.  HENT:  Head: Normocephalic and atraumatic.  Eyes: Pupils are equal, round, and reactive to light. Conjunctivae are normal.  Neck: Normal range of motion. Neck supple. No JVD.  Cardiovascular: Normal rate, regular rhythm and normal heart sounds.  No murmur heard. Respiratory: Effort normal and breath sounds normal. He has no wheezes. He has no rales.  GI: Soft. Bowel sounds are normal. There is no tenderness. There is no rebound.  Musculoskeletal: He exhibits no edema.  Lymphadenopathy:    He has no cervical adenopathy.  Neurological: He is alert and oriented to person, place, and time. No cranial nerve deficit.  Skin: Skin is warm and dry.  Psychiatric: He has a normal mood and affect.   Lab Results: Recent Labs    11/12/17 0720 11/13/17 0450  WBC 6.4 6.1  HGB 16.1 14.3  PLT 190 157   Recent Labs    11/12/17 0720  NA 136  K 4.2  CL 102  CO2 25  GLUCOSE 153*  BUN 22*  CREATININE 0.73   Cardiac Studies: EKG 11/11/2017: Sinus rhythm. Inferior T wave inversions new  Hospital echocardiogram 11/12/2017;  - Left ventricle: The cavity size was normal. Wall thickness was   normal. Systolic function was normal. The estimated ejection   fraction was in the range of 55% to 60%. Wall motion was  normal;   there were no regional wall motion abnormalities. Left   ventricular diastolic function parameters were normal. - No significant valvular abnormality. - Inadequate TR jet to estimate PASP.  Cath 11/12/2017: LM: Distal calcified 40% stenosis LAD: Prox calcified 40% stenosis        Mid 95% stenosis LCx: Mid long 95% stenosis RCA: Prox 95%, mid to distal long diffuse 75% stenosis  LVEDP normal LVEDP 55-60%  Assessment:  37 y/o Caucasian male with type 2 DM, hypertriglyceridemia (>1000), h/o pancreatitis, prior h/o tobacco and alcohol abuse, recent abnormal stress test with severe inferior, inferolateral ischemia, now admitted with unstable angina  Unstable angina: Severe multivessel CAD. Syntax score 12 Hypertension Type 2 DM Hypertriglyceridemia  Plan: I discussed the option of stents vs CABG with the patient. I have also sough consult from CV[facility]. Appreciate Dr. Sunday Corn help. Both are comparable options for complete revascularization, in addition to aggressive medical therapy and risk factor modification. If he chooses surgical revascularization option, will be on 10/18. If he chooses PCI, will likely perform this in a staged manner, first on 10/17 Continue aspirin/heparin without P2Y12 inhibitor until decision is made.   LOS: 0 days    Dyson Sevey J Daziah Hesler 11/13/2017, 2:26 PM  Briauna Gilmartin Emiliano Dyer, MD [facility] [place] Pager: [phone] Office: [phone] If no answer Cell [phone]

## 2017-11-13 NOTE — H&P (View-Only) (Signed)
Reason for Consult:3 vessel CAD with unstable coronary syndrome Referring Physician: Dr. Coral Else Beightol is an 37 y.o. male.  HPI: 37 yo man presents with cc/o   Mr. Santini is a 37 yo man with a past history of obesity, type 2 diabetes, hypertension, hyperlipidemia, pancreatitis, arthritis and urethral stricture. He has no known family history of CAD but both his father and paternal grandfather died young of unknown causes.  He does have a past history of tobacco abuse, which he quit 6 years ago and ethanol use but says he is not currently drinking.  Over the past year he is noted pain in both shoulders and arms as well as his neck with exertion.  More recently these episodes have been getting worse and he is also noted some diaphoresis with exertion.  He has not had any substernal chest pain, pressure, or tightness.  He underwent nuclear stress testing.  He was able to complete the exercise portion of the test but the nuclear images showed severe inferior and inferior lateral ischemic changes.  He was scheduled for cardiac catheterization yesterday but presented to the ED in the morning with complaint of left neck pain.    He was admitted and did undergo cardiac catheterization.  Catheterization revealed severe three-vessel coronary disease.  He has been pain-free since admission.  Past Medical History:  Diagnosis Date  . Arthritis   . At risk for sleep apnea    STOP-BANG= 6     SENT TO PCP 08-05-2014  . Hyperlipidemia   . Hypertension   . Obesity   . Pancreatitis   . Urethral stricture     Past Surgical History:  Procedure Laterality Date  . APPENDECTOMY  age 11  . CYSTOSCOPY WITH URETHRAL DILATATION N/A 08/10/2014   Procedure: CYSTOSCOPY WITH URETHRAL DILATATION;  Surgeon: Cleon Gustin, MD;  Location: [facility];  Service: Urology;  Laterality: N/A;  . KNEE ARTHROSCOPY WITH ANTERIOR CRUCIATE LIGAMENT (ACL) REPAIR Left 2000  . LEFT HEART CATH AND  CORONARY ANGIOGRAPHY N/A 11/12/2017   Procedure: LEFT HEART CATH AND CORONARY ANGIOGRAPHY;  Surgeon: Nigel Mormon, MD;  Location: [facility];  Service: Cardiovascular;  Laterality: N/A;  . ORIF RIGHT HAND FX  2000  . PENILE BIOPSY N/A 08/10/2014   Procedure: Core biopsy of penile glands lesion;  Surgeon: Cleon Gustin, MD;  Location: [facility]  Service: Urology;  Laterality: N/A;    Family History  Problem Relation Age of Onset  . Heart disease Neg Hx     Social History:  reports that he quit smoking about 6 years ago. His smoking use included cigarettes. He quit after 10.00 years of use. He has never used smokeless tobacco. He reports that he drinks alcohol. He reports that he does not use drugs.  Allergies: No Known Allergies  Medications:  Scheduled: . aspirin  81 mg Oral Daily  . isosorbide mononitrate  30 mg Oral Daily  . omega-3 acid ethyl esters  2 g Oral BID  . rosuvastatin  10 mg Oral q1800  . sodium chloride flush  3 mL Intravenous Q12H    Results for orders placed or performed during the hospital encounter of 11/12/17 (from the past 48 hour(s))  Basic metabolic panel     Status: Abnormal   Collection Time: 11/12/17  7:20 AM  Result Value Ref Range   Sodium 136 135 - 145 mmol/L   Potassium 4.2 3.5 - 5.1 mmol/L  Chloride 102 98 - 111 mmol/L   CO2 25 22 - 32 mmol/L   Glucose, Bld 153 (H) 70 - 99 mg/dL   BUN 22 (H) 6 - 20 mg/dL   Creatinine, Ser 0.73 0.61 - 1.24 mg/dL   Calcium 9.4 8.9 - 10.3 mg/dL   GFR calc non Af Amer >60 >60 mL/min   GFR calc Af Amer >60 >60 mL/min    Comment: (NOTE) The eGFR has been calculated using the CKD EPI equation. This calculation has not been validated in all clinical situations. eGFR's persistently <60 mL/min signify possible Chronic Kidney Disease.    Anion gap 9 5 - 15    Comment: Performed at [facility] [address], [place], [place] [address]  CBC     Status: None   Collection  Time: 11/12/17  7:20 AM  Result Value Ref Range   WBC 6.4 4.0 - 10.5 K/uL   RBC 5.42 4.22 - 5.81 MIL/uL   Hemoglobin 16.1 13.0 - 17.0 g/dL   HCT 46.7 39.0 - 52.0 %   MCV 86.2 80.0 - 100.0 fL   MCH 29.7 26.0 - 34.0 pg   MCHC 34.5 30.0 - 36.0 g/dL   RDW 13.4 11.5 - 15.5 %   Platelets 190 150 - 400 K/uL   nRBC 0.0 0.0 - 0.2 %    Comment: Performed at [facility], [place] [address] [place], [place] [address]  I-stat troponin, ED     Status: None   Collection Time: 11/12/17  7:23 AM  Result Value Ref Range   Troponin i, poc 0.01 0.00 - 0.08 ng/mL   Comment 3            Comment: Due to the release kinetics of cTnI, a negative result within the first hours of the onset of symptoms does not rule out myocardial infarction with certainty. If myocardial infarction is still suspected, repeat the test at appropriate intervals.   Glucose, capillary     Status: None   Collection Time: 11/12/17  1:31 PM  Result Value Ref Range   Glucose-Capillary 99 70 - 99 mg/dL  Glucose, capillary     Status: Abnormal   Collection Time: 11/12/17  4:14 PM  Result Value Ref Range   Glucose-Capillary 175 (H) 70 - 99 mg/dL   Comment 1 Notify RN    Comment 2 Document in Chart   Glucose, capillary     Status: Abnormal   Collection Time: 11/12/17 10:16 PM  Result Value Ref Range   Glucose-Capillary 155 (H) 70 - 99 mg/dL  Heparin level (unfractionated)     Status: Abnormal   Collection Time: 11/12/17 10:54 PM  Result Value Ref Range   Heparin Unfractionated 0.11 (L) 0.30 - 0.70 IU/mL    Comment: (NOTE) If heparin results are below expected values, and patient dosage has  been confirmed, suggest follow up testing of antithrombin III levels. Performed at [facility], [place] [address] [place] [address]   CBC     Status: None   Collection Time: 11/13/17  4:50 AM  Result Value Ref Range   WBC 6.1 4.0 - 10.5 K/uL   RBC 4.97 4.22 - 5.81 MIL/uL   Hemoglobin 14.3 13.0 - 17.0 g/dL   HCT  42.9 39.0 - 52.0 %   MCV 86.3 80.0 - 100.0 fL   MCH 28.8 26.0 - 34.0 pg   MCHC 33.3 30.0 - 36.0 g/dL   RDW 13.6 11.5 -  15.5 %   Platelets 157 150 - 400 K/uL   nRBC 0.0 0.0 - 0.2 %    Comment: Performed at [facility], [place] [address] [place], [place] [address]  Heparin level (unfractionated)     Status: Abnormal   Collection Time: 11/13/17  6:27 AM  Result Value Ref Range   Heparin Unfractionated 0.24 (L) 0.30 - 0.70 IU/mL    Comment: (NOTE) If heparin results are below expected values, and patient dosage has  been confirmed, suggest follow up testing of antithrombin III levels. Performed at [facility], [place] [address], [place], [place] [address]   Glucose, capillary     Status: Abnormal   Collection Time: 11/13/17  7:57 AM  Result Value Ref Range   Glucose-Capillary 107 (H) 70 - 99 mg/dL  Glucose, capillary     Status: Abnormal   Collection Time: 11/13/17 11:18 AM  Result Value Ref Range   Glucose-Capillary 123 (H) 70 - 99 mg/dL    Dg Chest 2 View  Result Date: 11/12/2017 CLINICAL DATA:  Chest pain. EXAM: CHEST - 2 VIEW COMPARISON:  01/07/2005. FINDINGS: Trachea is midline. Heart size normal. Lungs are clear. No pleural fluid. IMPRESSION: No acute findings. Electronically Signed   By: Lorin Picket M.D.   On: 11/12/2017 08:20   Echocardiogram Study Conclusions  - Left ventricle: The cavity size was normal. Wall thickness was   normal. Systolic function was normal. The estimated ejection   fraction was in the range of 55% to 60%. Wall motion was normal;   there were no regional wall motion abnormalities. Left   ventricular diastolic function parameters were normal. - No significant valvular abnormality. - Inadequate TR jet to estimate PASP. Cardiac catheterization Conclusion   LM: Distal calcified 40% stenosis LAD: Prox calcified 40% stenosis        Mid 95% stenosis LCx: Mid long 95% stenosis RCA: Prox 95%, mid to distal long diffuse 75%  stenosis  LVEDP normal LVEDP 55-60%  Recommendation: Admit to telemetry Continue aspirin/heparin/statin CVTS consult for CABG in view of uncontrolled DM, hypertriglyceridemia and severe multivessel CAD  Manish Esther Hardy, MD [facility] [place] Pager: [phone] Office: [phone] If no answer Cell [phone]   I personally reviewed the catheterization images and concur with the findings noted above.  Review of Systems  Constitutional: Positive for diaphoresis and malaise/fatigue.  Respiratory: Positive for shortness of breath. Negative for cough.   Cardiovascular: Negative for chest pain, palpitations and leg swelling.       Neck, shoulder, and arm pain with exertion  Gastrointestinal: Negative for nausea and vomiting.  Genitourinary: Negative for dysuria and frequency.  Musculoskeletal: Negative for back pain and joint pain.  Neurological: Negative for focal weakness and loss of consciousness.  Endo/Heme/Allergies: Does not bruise/bleed easily.   Blood pressure 115/73, pulse 77, temperature 98.7 F (37.1 C), temperature source Oral, resp. rate 15, height _0  (1.778 m), weight 109 kg, SpO2 96 %. Physical Exam  Vitals reviewed. Constitutional: He is oriented to person, place, and time. He appears well-developed and well-nourished. No distress.  HENT:  Head: Normocephalic and atraumatic.  Mouth/Throat: No oropharyngeal exudate.  Eyes: Pupils are equal, round, and reactive to light. Conjunctivae and EOM are normal. No scleral icterus.  Neck: Neck supple. No thyromegaly present.  Cardiovascular: Normal rate, regular rhythm, normal heart sounds and intact distal pulses. Exam reveals no gallop and no friction rub.  No murmur heard. Normal Allen's test on left  Respiratory: Effort  normal. No respiratory distress. He has no wheezes. He has no rales.  GI: Soft. He exhibits no distension. There is no tenderness.  Musculoskeletal: He exhibits no edema or  deformity.  Lymphadenopathy:    He has no cervical adenopathy.  Neurological: He is alert and oriented to person, place, and time. No cranial nerve deficit. He exhibits normal muscle tone. Coordination normal.  Skin: Skin is warm and dry.    Assessment/Plan: Mr. 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  He has been having exertional neck shoulder and arm pain for over a year now.  More recently this is become more frequent and severe.  He had a positive stress test which showed inferior ischemia.  At catheterization he had severe three-vessel coronary disease.  Options for treatment include multivessel percutaneous intervention versus coronary artery bypass grafting.  I discussed the possibility of coronary bypass grafting with Mr. Agner.  His mother was present, but his wife was not present at the time of discussion.  He understands that coronary bypass grafting tends to give a more durable long-term result for multivessel disease and percutaneous intervention, albeit with more immediate risk and discomfort.  I described the general nature of coronary bypass grafting to him.  We would plan to use a left radial artery.  We might potentially also use bilateral mammary arteries.  He does have type 2 diabetes but that is only been recently diagnosed.  Is unclear how long he had that prior.  He understands that coronary bypass grafting is a palliative treatment and not a cure for the underlying disease.  We discussed the general nature of the procedure including the need for general anesthesia, the incisions to be used, the need for cardiopulmonary bypass, the expected hospital stay, and the overall recovery.  I reviewed the indications, risks, benefits, and alternatives.  He understands the risks include, but not limited to death, MI, DVT, PE, bleeding, possible  need for transfusion, infection, cardiac arrhythmias, renal or respiratory failure, as well as other unforeseeable complications.  He wishes to think over his options.  If he does choose to proceed with surgery, it will likely be on Friday, 11/16/2017  Melrose Nakayama 11/13/2017, 1:26 PM

## 2017-11-13 NOTE — Progress Notes (Signed)
As per patient's wishes, I have requested Dr. Casimiro Needle Copper for his opinion regarding revascularization options for patient's multivessel CAD. Appreciate Dr. Earmon Phoenix input. Continue current medical therapy.  Elder Negus, MD [facility] [place] Pager: [phone] Office: [phone] If no answer Cell [phone]

## 2017-11-13 NOTE — Consult Note (Signed)
Cardiology Consultation:   Patient ID: TEVAN MARIAN MRN: [id number]; DOB: 1980/10/24  Admit date: 11/12/2017 Date of Consult: 11/13/2017  Primary Care Provider: Marva Panda, NP Primary Cardiologist: No primary care provider on file.  Primary Electrophysiologist:  None    Patient Profile:   Andre Ford is a 37 y.o. male with a hx of multivessel CAD who is being seen today for the evaluation of unstable anginaat the request of Dr Rosemary Holms..  History of Present Illness:   Andre Ford is a 37 yo male with a hx of type 2 DM, former tobacco use, and chronic pancreatitis. He has developed progressive symptoms of angina with exertion over the past year described as upper back and neck pain radiating to his arms. He was noted to have severe ischemia in the inferior and lateral walls on nuclear stress testing and underwent diagnostic catheterization by Dr Rosemary Holms demonstrating severe 3 vessel CAD. He is considering treatment options and has been evaluated by Dr Dorris Fetch in formal cardiac surgical consultation. He requested a second opinion regarding best revascularization option of multivessel PCI versus CABG. He denies resting discomfort or shortness of breath at present.   Past Medical History:  Diagnosis Date  . Arthritis   . At risk for sleep apnea    STOP-BANG= 6     SENT TO PCP 08-05-2014  . Hyperlipidemia   . Hypertension   . Obesity   . Pancreatitis   . Urethral stricture     Past Surgical History:  Procedure Laterality Date  . APPENDECTOMY  age 11  . CYSTOSCOPY WITH URETHRAL DILATATION N/A 08/10/2014   Procedure: CYSTOSCOPY WITH URETHRAL DILATATION;  Surgeon: Malen Gauze, MD;  Location: [facility];  Service: Urology;  Laterality: N/A;  . KNEE ARTHROSCOPY WITH ANTERIOR CRUCIATE LIGAMENT (ACL) REPAIR Left 2000  . LEFT HEART CATH AND CORONARY ANGIOGRAPHY N/A 11/12/2017   Procedure: LEFT HEART CATH AND CORONARY ANGIOGRAPHY;  Surgeon:  Elder Negus, MD;  Location: [facility];  Service: Cardiovascular;  Laterality: N/A;  . ORIF RIGHT HAND FX  2000  . PENILE BIOPSY N/A 08/10/2014   Procedure: Core biopsy of penile glands lesion;  Surgeon: Malen Gauze, MD;  Location: [facility];  Service: Urology;  Laterality: N/A;     Home Medications:  Prior to Admission medications   Medication Sig Start Date End Date Taking? Authorizing Provider  ALPRAZolam Prudy Feeler) 0.5 MG tablet Take 0.5 mg by mouth 2 (two) times daily as needed for anxiety. 09/03/17  Yes [provider]  [employer] ASPIRIN ADULT LOW DOSE 81 MG chewable tablet Chew 81 mg by mouth daily. 11/09/17  Yes [provider]  escitalopram (LEXAPRO) 10 MG tablet Take 10 mg by mouth daily. 10/30/17  Yes [provider]  JARDIANCE 25 MG TABS tablet Take 25 mg by mouth daily. 11/04/17  Yes [provider]  meloxicam (MOBIC) 15 MG tablet Take 15 mg by mouth daily as needed (arthrits flare up).  11/09/17  Yes [provider]  metFORMIN (GLUCOPHAGE) 1000 MG tablet Take 1,000 mg by mouth 2 (two) times daily. 10/30/17  Yes [provider]  nitroGLYCERIN (NITROSTAT) 0.4 MG SL tablet Place 0.4 mg under the tongue every 5 (five) minutes x 3 doses as needed for chest pain. 11/09/17  Yes [provider]  VASCEPA 1 g CAPS Take 2 capsules by mouth 2 (two) times daily. 11/04/17  Yes [provider]  gemfibrozil (LOPID) 600 MG tablet Take  1 tablet (600 mg total) by mouth daily. Patient not taking: Reported on 11/12/2017 10/14/14   Catarina Hartshorn, MD  metFORMIN (GLUCOPHAGE) 500 MG tablet Take 1 tablet (500 mg total) 2 (two) times daily by mouth. Patient not taking: Reported on 11/12/2017 12/14/16   Ward, Layla Maw, DO  ondansetron (ZOFRAN ODT) 4 MG disintegrating tablet Take 1 tablet (4 mg total) every 8 (eight) hours as needed by mouth for nausea or vomiting. Patient not taking: Reported on 01/24/2017 12/14/16    Ward, Layla Maw, DO  oxyCODONE-acetaminophen (PERCOCET) 5-325 MG tablet Take 1 tablet by mouth every 4 (four) hours as needed for severe pain. Patient not taking: Reported on 11/12/2017 01/24/17   Mancel Bale, MD  tamsulosin [facility]) 0.4 MG CAPS capsule 1 q HS to aid stone passage Patient not taking: Reported on 11/12/2017 01/24/17   Mancel Bale, MD    Inpatient Medications: Scheduled Meds: . aspirin  81 mg Oral Daily  . isosorbide mononitrate  30 mg Oral Daily  . omega-3 acid ethyl esters  2 g Oral BID  . rosuvastatin  10 mg Oral q1800  . sodium chloride flush  3 mL Intravenous Q12H   Continuous Infusions: . sodium chloride    . heparin 1,850 Units/hr (11/13/17 0858)   PRN Meds: sodium chloride, acetaminophen, ALPRAZolam, nitroGLYCERIN, nitroGLYCERIN, ondansetron (ZOFRAN) IV, sodium chloride flush  Allergies:   No Known Allergies  Social History:   Social History   Socioeconomic History  . Marital status: Married    Spouse name: Not on file  . Number of children: Not on file  . Years of education: Not on file  . Highest education level: Not on file  Occupational History  . Not on file  Social Needs  . Financial resource strain: Not on file  . Food insecurity:    Worry: Not on file    Inability: Not on file  . Transportation needs:    Medical: Not on file    Non-medical: Not on file  Tobacco Use  . Smoking status: Former Smoker    Years: 10.00    Types: Cigarettes    Last attempt to quit: 08/05/2011    Years since quitting: 6.2  . Smokeless tobacco: Never Used  Substance and Sexual Activity  . Alcohol use: Yes    Comment: occasional  . Drug use: No  . Sexual activity: Never  Lifestyle  . Physical activity:    Days per week: Not on file    Minutes per session: Not on file  . Stress: Not on file  Relationships  . Social connections:    Talks on phone: Not on file    Gets together: Not on file    Attends religious service: Not on file    Active  member of club or organization: Not on file    Attends meetings of clubs or organizations: Not on file    Relationship status: Not on file  . Intimate partner violence:    Fear of current or ex partner: Not on file    Emotionally abused: Not on file    Physically abused: Not on file    Forced sexual activity: Not on file  Other Topics Concern  . Not on file  Social History Narrative  . Not on file    Family History:    Family History  Problem Relation Age of Onset  . Heart disease Neg Hx      ROS:  Please see the history of present illness.  All other ROS reviewed and negative.     Physical Exam/Data:   Vitals:   11/13/17 0608 11/13/17 0800 11/13/17 1119 11/13/17 1628  BP: 101/71 109/75 115/73 109/63  Pulse: 73 76 77 74  Resp:      Temp: 98.1 F (36.7 C) 97.7 F (36.5 C) 98.7 F (37.1 C) 98.7 F (37.1 C)  TempSrc: Oral Oral Oral Oral  SpO2: 98% 99% 96% 97%  Weight: 109 kg     Height:        Intake/Output Summary (Last 24 hours) at 11/13/2017 1821 Last data filed at 11/13/2017 1238 Gross per 24 hour  Intake 469.45 ml  Output 300 ml  Net 169.45 ml   Filed Weights   11/12/17 1000 11/13/17 0608  Weight: 111.1 kg 109 kg   Body mass index is 34.49 kg/m.  General:  Well nourished, well developed, in no acute distress HEENT: normal Lymph: no adenopathy Neck: no JVD Endocrine:  No thryomegaly Vascular: No carotid bruits; FA pulses 2+ bilaterally without bruits  Cardiac:  normal S1, S2; RRR; no murmur  Lungs:  clear to auscultation bilaterally, no wheezing, rhonchi or rales  Abd: soft, nontender, no hepatomegaly  Ext: no edema Musculoskeletal:  No deformities, BUE and BLE strength normal and equal Skin: warm and dry, many tattoos Neuro:  CNs 2-12 intact, no focal abnormalities noted Psych:  Normal affect   EKG:  The EKG was personally reviewed and demonstrates:  NSR, nonspecific T wave abnormality, HR 85 bpm  Telemetry:  Telemetry was personally reviewed  and demonstrates:  Normal sinus rhythm  Relevant CV Studies: Echo 11-12-2017: Study Conclusions  - Left ventricle: The cavity size was normal. Wall thickness was   normal. Systolic function was normal. The estimated ejection   fraction was in the range of 55% to 60%. Wall motion was normal;   there were no regional wall motion abnormalities. Left   ventricular diastolic function parameters were normal. - No significant valvular abnormality. - Inadequate TR jet to estimate PASP.  Cath: LM: Distal calcified 40% stenosis LAD: Prox calcified 40% stenosis        Mid 95% stenosis LCx: Mid long 95% stenosis RCA: Prox 95%, mid to distal long diffuse 75% stenosis  LVEDP normal LVEDP 55-60%  Recommendation: Admit to telemetry Continue aspirin/heparin/statin CVTS consult for CABG in view of uncontrolled DM, hypertriglyceridemia and severe multivessel CAD  Andre Emiliano Dyer, MD [facility] [place] Pager: [phone] Office: [phone] If no answer Cell [phone]   Laboratory Data:  Chemistry Recent Labs  Lab 11/12/17 0720  NA 136  K 4.2  CL 102  CO2 25  GLUCOSE 153*  BUN 22*  CREATININE 0.73  CALCIUM 9.4  GFRNONAA >60  GFRAA >60  ANIONGAP 9    No results for input(s): PROT, ALBUMIN, AST, ALT, ALKPHOS, BILITOT in the last 168 hours. Hematology Recent Labs  Lab 11/12/17 0720 11/13/17 0450  WBC 6.4 6.1  RBC 5.42 4.97  HGB 16.1 14.3  HCT 46.7 42.9  MCV 86.2 86.3  MCH 29.7 28.8  MCHC 34.5 33.3  RDW 13.4 13.6  PLT 190 157   Cardiac EnzymesNo results for input(s): TROPONINI in the last 168 hours.  Recent Labs  Lab 11/12/17 0723  TROPIPOC 0.01    BNPNo results for input(s): BNP, PROBNP in the last 168 hours.  DDimer No results for input(s): DDIMER in the last 168 hours.  Radiology/Studies:  Dg Chest 2 View  Result Date: 11/12/2017 CLINICAL DATA:  Chest pain. EXAM:  CHEST - 2 VIEW COMPARISON:  01/07/2005. FINDINGS: Trachea is midline.  Heart size normal. Lungs are clear. No pleural fluid. IMPRESSION: No acute findings. Electronically Signed   By: Leanna Battles M.D.   On: 11/12/2017 08:20    Assessment and Plan:   1. Unstable angina with severe multivessel CAD 2. Type II DM 3. Hypertriglyceridemia 4. Obesity  I personally reviewed the patient's cardiac catheterization films and discussed treatment options at length with the patient and his mother today. He has severe focal stenoses of the mid-LAD and circumflex, both approachable with PCI. However, he also has moderate distal left main disease and 40-50% stenosis of the proximal LAD. In addition, the RCA is severely diseased over a long segment extending into the distal RCA bifurcation. While PCI is reasonable, I think he would have a better long term outcome with multivessel CABG considering the diffuse nature of his CAD. He appears to have suitable targets for grafting and ideally he will have adequate conduit for arterial grafting. We reviewed expectations with CABG, recovery, and long-term medical therapy and lifestyle modification. He is still considering options but seems to be in favor of proceeding with CABG. I discussed his case with Dr Rosemary Holms and Dr Dorris Fetch. Please let me know if I can help any further with his care while he is in the hospital. He requests to follow with my practice since I have cared for several of his family members and friends. I would be happy to see him in follow-up if agreeable with Dr Rosemary Holms.   [facility] will sign off.   Medication Recommendations:  none Other recommendations (labs, testing, etc):  See above Follow up as an outpatient:  Following CABG  For questions or updates, please contact [facility] Please consult [email or web address] for contact info under     Signed, Tonny Bollman, MD  11/13/2017 6:21 PM

## 2017-11-13 NOTE — Progress Notes (Addendum)
ANTICOAGULATION CONSULT NOTE  Pharmacy Consult for Heparin Indication: multivessel CAD  Heparin Dosing Weight: 97.2 kg  Labs: Recent Labs    11/12/17 0720 11/12/17 2254 11/13/17 0450 11/13/17 0627  HGB 16.1  --  14.3  --   HCT 46.7  --  42.9  --   PLT 190  --  157  --   HEPARINUNFRC  --  0.11*  --  0.24*  CREATININE 0.73  --   --   --     Assessment: Andre Ford presenting with jaw pain, CP, shoulder/back pain. Not on anticoagulation PTA. Pt s/p cath which showed multivessel CAD and consulting TCTS for possible CABG.  Pharmacy consulted to resume heparin post cath.  Heparin level remains subtherapeutic (0.24) on gtt at 1650 units/hr. No bleeding noted and no issues with infusion noted per RN. CBC relatively stable.  Goal of Therapy:  Heparin level 0.3-0.7 units/ml Monitor platelets by anticoagulation protocol: Yes   Plan:  Increase heparin to 1850 units/hr Will f/u 6 hr heparin level  Christoper Fabian, PharmD, BCPS Clinical pharmacist  **Pharmacist phone directory can now be found on [email or web address] ([email or web address]).  Listed under [facility].  11/13/2017 8:20 AM  Addendum (1550): Heparin level therapeutic (0.33) on gtt at 1850 units/hr. No bleeding noted.   Plan: Continue heparin 1850 units/hr and f/u a.m. heparin level  Christoper Fabian, PharmD, BCPS Clinical pharmacist  **Pharmacist phone directory can now be found on [email or web address] ([email or web address]).  Listed under [facility]. 11/13/2017 3:48 PM

## 2017-11-13 NOTE — Progress Notes (Signed)
Met with patient with his wife this morning. Low Syntax score with lesions amenable to both CABG and PCI. I recommended that he talk to the surgeons, weigh his options and make a decision. Continue to hold P2Y12 inhibitor for now.  Nigel Mormon, MD [facility]. [place] Pager: [phone] Office: [phone] If no answer Cell [phone]

## 2017-11-13 NOTE — Consult Note (Signed)
Reason for Consult:3 vessel CAD with unstable coronary syndrome Referring Physician: Dr. [name] is an 37 y.o. male.  HPI: 37 yo man presents with cc/o   Andre Ford is a 37 yo man with a past history of obesity, type 2 diabetes, hypertension, hyperlipidemia, pancreatitis, arthritis and urethral stricture. He has no known family history of CAD but both his father and paternal grandfather died young of unknown causes.  He does have a past history of tobacco abuse, which he quit 6 years ago and ethanol use but says he is not currently drinking.  Over the past year he is noted pain in both shoulders and arms as well as his neck with exertion.  More recently these episodes have been getting worse and he is also noted some diaphoresis with exertion.  He has not had any substernal chest pain, pressure, or tightness.  He underwent nuclear stress testing.  He was able to complete the exercise portion of the test but the nuclear images showed severe inferior and inferior lateral ischemic changes.  He was scheduled for cardiac catheterization yesterday but presented to the ED in the morning with complaint of left neck pain.    He was admitted and did undergo cardiac catheterization.  Catheterization revealed severe three-vessel coronary disease.  He has been pain-free since admission.  Past Medical History:  Diagnosis Date  . Arthritis   . At risk for sleep apnea    STOP-BANG= 6     SENT TO PCP 08-05-2014  . Hyperlipidemia   . Hypertension   . Obesity   . Pancreatitis   . Urethral stricture     Past Surgical History:  Procedure Laterality Date  . APPENDECTOMY  age 11  . CYSTOSCOPY WITH URETHRAL DILATATION N/A 08/10/2014   Procedure: CYSTOSCOPY WITH URETHRAL DILATATION;  Surgeon: Andre Gustin, MD;  Location: [facility];  Service: Urology;  Laterality: N/A;  . KNEE ARTHROSCOPY WITH ANTERIOR CRUCIATE LIGAMENT (ACL) REPAIR Left 2000  . LEFT HEART CATH AND  CORONARY ANGIOGRAPHY N/A 11/12/2017   Procedure: LEFT HEART CATH AND CORONARY ANGIOGRAPHY;  Surgeon: Andre Mormon, MD;  Location: [facility];  Service: Cardiovascular;  Laterality: N/A;  . ORIF RIGHT HAND FX  2000  . PENILE BIOPSY N/A 08/10/2014   Procedure: Core biopsy of penile glands lesion;  Surgeon: Andre Gustin, MD;  Location: [facility]  Service: Urology;  Laterality: N/A;    Family History  Problem Relation Age of Onset  . Heart disease Neg Hx     Social History:  reports that he quit smoking about 6 years ago. His smoking use included cigarettes. He quit after 10.00 years of use. He has never used smokeless tobacco. He reports that he drinks alcohol. He reports that he does not use drugs.  Allergies: No Known Allergies  Medications:  Scheduled: . aspirin  81 mg Oral Daily  . isosorbide mononitrate  30 mg Oral Daily  . omega-3 acid ethyl esters  2 g Oral BID  . rosuvastatin  10 mg Oral q1800  . sodium chloride flush  3 mL Intravenous Q12H    Results for orders placed or performed during the hospital encounter of 11/12/17 (from the past 48 hour(s))  Basic metabolic panel     Status: Abnormal   Collection Time: 11/12/17  7:20 AM  Result Value Ref Range   Sodium 136 135 - 145 mmol/L   Potassium 4.2 3.5 - 5.1 mmol/L  Chloride 102 98 - 111 mmol/L   CO2 25 22 - 32 mmol/L   Glucose, Bld 153 (H) 70 - 99 mg/dL   BUN 22 (H) 6 - 20 mg/dL   Creatinine, Ser 0.73 0.61 - 1.24 mg/dL   Calcium 9.4 8.9 - 10.3 mg/dL   GFR calc non Af Amer >60 >60 mL/min   GFR calc Af Amer >60 >60 mL/min    Comment: (NOTE) The eGFR has been calculated using the CKD EPI equation. This calculation has not been validated in all clinical situations. eGFR's persistently <60 mL/min signify possible Chronic Kidney Disease.    Anion gap 9 5 - 15    Comment: Performed at [facility] [address], [place], [place] [address]  CBC     Status: None   Collection  Time: 11/12/17  7:20 AM  Result Value Ref Range   WBC 6.4 4.0 - 10.5 K/uL   RBC 5.42 4.22 - 5.81 MIL/uL   Hemoglobin 16.1 13.0 - 17.0 g/dL   HCT 46.7 39.0 - 52.0 %   MCV 86.2 80.0 - 100.0 fL   MCH 29.7 26.0 - 34.0 pg   MCHC 34.5 30.0 - 36.0 g/dL   RDW 13.4 11.5 - 15.5 %   Platelets 190 150 - 400 K/uL   nRBC 0.0 0.0 - 0.2 %    Comment: Performed at [facility], [place] [address] [place], [place] [address]  I-stat troponin, ED     Status: None   Collection Time: 11/12/17  7:23 AM  Result Value Ref Range   Troponin i, poc 0.01 0.00 - 0.08 ng/mL   Comment 3            Comment: Due to the release kinetics of cTnI, a negative result within the first hours of the onset of symptoms does not rule out myocardial infarction with certainty. If myocardial infarction is still suspected, repeat the test at appropriate intervals.   Glucose, capillary     Status: None   Collection Time: 11/12/17  1:31 PM  Result Value Ref Range   Glucose-Capillary 99 70 - 99 mg/dL  Glucose, capillary     Status: Abnormal   Collection Time: 11/12/17  4:14 PM  Result Value Ref Range   Glucose-Capillary 175 (H) 70 - 99 mg/dL   Comment 1 Notify RN    Comment 2 Document in Chart   Glucose, capillary     Status: Abnormal   Collection Time: 11/12/17 10:16 PM  Result Value Ref Range   Glucose-Capillary 155 (H) 70 - 99 mg/dL  Heparin level (unfractionated)     Status: Abnormal   Collection Time: 11/12/17 10:54 PM  Result Value Ref Range   Heparin Unfractionated 0.11 (L) 0.30 - 0.70 IU/mL    Comment: (NOTE) If heparin results are below expected values, and patient dosage has  been confirmed, suggest follow up testing of antithrombin III levels. Performed at [facility], [place] [address] [place] [address]   CBC     Status: None   Collection Time: 11/13/17  4:50 AM  Result Value Ref Range   WBC 6.1 4.0 - 10.5 K/uL   RBC 4.97 4.22 - 5.81 MIL/uL   Hemoglobin 14.3 13.0 - 17.0 g/dL   HCT  42.9 39.0 - 52.0 %   MCV 86.3 80.0 - 100.0 fL   MCH 28.8 26.0 - 34.0 pg   MCHC 33.3 30.0 - 36.0 g/dL   RDW 13.6 11.5 -  15.5 %   Platelets 157 150 - 400 K/uL   nRBC 0.0 0.0 - 0.2 %    Comment: Performed at [facility], [place] [address] [place], [place] [address]  Heparin level (unfractionated)     Status: Abnormal   Collection Time: 11/13/17  6:27 AM  Result Value Ref Range   Heparin Unfractionated 0.24 (L) 0.30 - 0.70 IU/mL    Comment: (NOTE) If heparin results are below expected values, and patient dosage has  been confirmed, suggest follow up testing of antithrombin III levels. Performed at [facility], [place] [address], [place], [place] [address]   Glucose, capillary     Status: Abnormal   Collection Time: 11/13/17  7:57 AM  Result Value Ref Range   Glucose-Capillary 107 (H) 70 - 99 mg/dL  Glucose, capillary     Status: Abnormal   Collection Time: 11/13/17 11:18 AM  Result Value Ref Range   Glucose-Capillary 123 (H) 70 - 99 mg/dL    Dg Chest 2 View  Result Date: 11/12/2017 CLINICAL DATA:  Chest pain. EXAM: CHEST - 2 VIEW COMPARISON:  01/07/2005. FINDINGS: Trachea is midline. Heart size normal. Lungs are clear. No pleural fluid. IMPRESSION: No acute findings. Electronically Signed   By: Lorin Picket M.D.   On: 11/12/2017 08:20   Echocardiogram Study Conclusions  - Left ventricle: The cavity size was normal. Wall thickness was   normal. Systolic function was normal. The estimated ejection   fraction was in the range of 55% to 60%. Wall motion was normal;   there were no regional wall motion abnormalities. Left   ventricular diastolic function parameters were normal. - No significant valvular abnormality. - Inadequate TR jet to estimate PASP. Cardiac catheterization Conclusion   LM: Distal calcified 40% stenosis LAD: Prox calcified 40% stenosis        Mid 95% stenosis LCx: Mid long 95% stenosis RCA: Prox 95%, mid to distal long diffuse 75%  stenosis  LVEDP normal LVEDP 55-60%  Recommendation: Admit to telemetry Continue aspirin/heparin/statin CVTS consult for CABG in view of uncontrolled DM, hypertriglyceridemia and severe multivessel CAD  Andre Esther Hardy, MD [facility]. [place] Pager: [phone] Office: [phone] If no answer Cell [phone]   I personally reviewed the catheterization images and concur with the findings noted above.  Review of Systems  Constitutional: Positive for diaphoresis and malaise/fatigue.  Respiratory: Positive for shortness of breath. Negative for cough.   Cardiovascular: Negative for chest pain, palpitations and leg swelling.       Neck, shoulder, and arm pain with exertion  Gastrointestinal: Negative for nausea and vomiting.  Genitourinary: Negative for dysuria and frequency.  Musculoskeletal: Negative for back pain and joint pain.  Neurological: Negative for focal weakness and loss of consciousness.  Endo/Heme/Allergies: Does not bruise/bleed easily.   Blood pressure 115/73, pulse 77, temperature 98.7 F (37.1 C), temperature source Oral, resp. rate 15, height _0  (1.778 m), weight 109 kg, SpO2 96 %. Physical Exam  Vitals reviewed. Constitutional: He is oriented to person, place, and time. He appears well-developed and well-nourished. No distress.  HENT:  Head: Normocephalic and atraumatic.  Mouth/Throat: No oropharyngeal exudate.  Eyes: Pupils are equal, round, and reactive to light. Conjunctivae and EOM are normal. No scleral icterus.  Neck: Neck supple. No thyromegaly present.  Cardiovascular: Normal rate, regular rhythm, normal heart sounds and intact distal pulses. Exam reveals no gallop and no friction rub.  No murmur heard. Normal Allen's test on left  Respiratory: Effort  normal. No respiratory distress. He has no wheezes. He has no rales.  GI: Soft. He exhibits no distension. There is no tenderness.  Musculoskeletal: He exhibits no edema or  deformity.  Lymphadenopathy:    He has no cervical adenopathy.  Neurological: He is alert and oriented to person, place, and time. No cranial nerve deficit. He exhibits normal muscle tone. Coordination normal.  Skin: Skin is warm and dry.    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  He has been having exertional neck shoulder and arm pain for over a year now.  More recently this is become more frequent and severe.  He had a positive stress test which showed inferior ischemia.  At catheterization he had severe three-vessel coronary disease.  Options for treatment include multivessel percutaneous intervention versus coronary artery bypass grafting.  I discussed the possibility of coronary bypass grafting with Andre Ford.  His mother was present, but his wife was not present at the time of discussion.  He understands that coronary bypass grafting tends to give a more durable long-term result for multivessel disease and percutaneous intervention, albeit with more immediate risk and discomfort.  I described the general nature of coronary bypass grafting to him.  We would plan to use a left radial artery.  We might potentially also use bilateral mammary arteries.  He does have type 2 diabetes but that is only been recently diagnosed.  Is unclear how long he had that prior.  He understands that coronary bypass grafting is a palliative treatment and not a cure for the underlying disease.  We discussed the general nature of the procedure including the need for general anesthesia, the incisions to be used, the need for cardiopulmonary bypass, the expected hospital stay, and the overall recovery.  I reviewed the indications, risks, benefits, and alternatives.  He understands the risks include, but not limited to death, MI, DVT, PE, bleeding, possible  need for transfusion, infection, cardiac arrhythmias, renal or respiratory failure, as well as other unforeseeable complications.  He wishes to think over his options.  If he does choose to proceed with surgery, it will likely be on Friday, 11/16/2017  Melrose Nakayama 11/13/2017, 1:26 PM

## 2017-11-14 ENCOUNTER — Other Ambulatory Visit: Payer: Self-pay

## 2017-11-14 ENCOUNTER — Observation Stay (HOSPITAL_COMMUNITY): Payer: BLUE CROSS/BLUE SHIELD

## 2017-11-14 ENCOUNTER — Encounter (HOSPITAL_COMMUNITY): Payer: Self-pay | Admitting: General Practice

## 2017-11-14 DIAGNOSIS — E109 Type 1 diabetes mellitus without complications: Secondary | ICD-10-CM | POA: Diagnosis present

## 2017-11-14 DIAGNOSIS — Z7984 Long term (current) use of oral hypoglycemic drugs: Secondary | ICD-10-CM | POA: Diagnosis not present

## 2017-11-14 DIAGNOSIS — Z87891 Personal history of nicotine dependence: Secondary | ICD-10-CM | POA: Diagnosis not present

## 2017-11-14 DIAGNOSIS — I25110 Atherosclerotic heart disease of native coronary artery with unstable angina pectoris: Secondary | ICD-10-CM | POA: Diagnosis present

## 2017-11-14 DIAGNOSIS — R072 Precordial pain: Secondary | ICD-10-CM | POA: Diagnosis present

## 2017-11-14 DIAGNOSIS — N320 Bladder-neck obstruction: Secondary | ICD-10-CM | POA: Diagnosis present

## 2017-11-14 DIAGNOSIS — F419 Anxiety disorder, unspecified: Secondary | ICD-10-CM | POA: Diagnosis present

## 2017-11-14 DIAGNOSIS — I10 Essential (primary) hypertension: Secondary | ICD-10-CM | POA: Diagnosis present

## 2017-11-14 DIAGNOSIS — Z6835 Body mass index (BMI) 35.0-35.9, adult: Secondary | ICD-10-CM | POA: Diagnosis not present

## 2017-11-14 DIAGNOSIS — E8770 Fluid overload, unspecified: Secondary | ICD-10-CM | POA: Diagnosis present

## 2017-11-14 DIAGNOSIS — E781 Pure hyperglyceridemia: Secondary | ICD-10-CM | POA: Diagnosis present

## 2017-11-14 DIAGNOSIS — D62 Acute posthemorrhagic anemia: Secondary | ICD-10-CM | POA: Diagnosis not present

## 2017-11-14 DIAGNOSIS — D696 Thrombocytopenia, unspecified: Secondary | ICD-10-CM | POA: Diagnosis present

## 2017-11-14 DIAGNOSIS — E669 Obesity, unspecified: Secondary | ICD-10-CM | POA: Diagnosis present

## 2017-11-14 DIAGNOSIS — E785 Hyperlipidemia, unspecified: Secondary | ICD-10-CM | POA: Diagnosis present

## 2017-11-14 DIAGNOSIS — N35819 Other urethral stricture, male, unspecified site: Secondary | ICD-10-CM | POA: Diagnosis present

## 2017-11-14 DIAGNOSIS — Z9049 Acquired absence of other specified parts of digestive tract: Secondary | ICD-10-CM | POA: Diagnosis not present

## 2017-11-14 DIAGNOSIS — J9811 Atelectasis: Secondary | ICD-10-CM | POA: Diagnosis present

## 2017-11-14 DIAGNOSIS — K861 Other chronic pancreatitis: Secondary | ICD-10-CM | POA: Diagnosis present

## 2017-11-14 LAB — GLUCOSE, CAPILLARY
Glucose-Capillary: 124 mg/dL — ABNORMAL HIGH (ref 70–99)
Glucose-Capillary: 158 mg/dL — ABNORMAL HIGH (ref 70–99)
Glucose-Capillary: 116 mg/dL — ABNORMAL HIGH (ref 70–99)
Glucose-Capillary: 127 mg/dL — ABNORMAL HIGH (ref 70–99)

## 2017-11-14 LAB — PULMONARY FUNCTION TEST
FEF 25-75 Pre: 3.78 L/sec
FEF2575-%PRED-PRE: 87 %
FEV1-%PRED-PRE: 79 %
FEV1-PRE: 3.62 L
FEV1FVC-%Pred-Pre: 102 %
FEV6-%Pred-Pre: 78 %
FEV6-PRE: 4.39 L
FEV6FVC-%Pred-Pre: 102 %
FVC-%PRED-PRE: 77 %
FVC-Pre: 4.39 L
Pre FEV1/FVC ratio: 83 %
Pre FEV6/FVC Ratio: 100 %

## 2017-11-14 LAB — CBC
HEMATOCRIT: 42.7 % (ref 39.0–52.0)
Hemoglobin: 14.4 g/dL (ref 13.0–17.0)
MCH: 28.8 pg (ref 26.0–34.0)
MCHC: 33.7 g/dL (ref 30.0–36.0)
MCV: 85.4 fL (ref 80.0–100.0)
Platelets: 142 10*3/uL — ABNORMAL LOW (ref 150–400)
RBC: 5 MIL/uL (ref 4.22–5.81)
RDW: 13.4 % (ref 11.5–15.5)
WBC: 6.7 10*3/uL (ref 4.0–10.5)
nRBC: 0 % (ref 0.0–0.2)

## 2017-11-14 LAB — HEPARIN LEVEL (UNFRACTIONATED): Heparin Unfractionated: 0.44 IU/mL (ref 0.30–0.70)

## 2017-11-14 MED ORDER — TRAMADOL HCL 50 MG PO TABS
50.0000 mg | ORAL_TABLET | Freq: Four times a day (QID) | ORAL | Status: DC | PRN
  Administered 2017-11-14: 50 mg via ORAL
  Filled 2017-11-14: qty 1

## 2017-11-14 MED ORDER — OXYCODONE-ACETAMINOPHEN 5-325 MG PO TABS
2.0000 | ORAL_TABLET | Freq: Four times a day (QID) | ORAL | Status: DC | PRN
  Administered 2017-11-15: 2 via ORAL
  Filled 2017-11-14 (×2): qty 2

## 2017-11-14 MED ORDER — PNEUMOCOCCAL VAC POLYVALENT 25 MCG/0.5ML IJ INJ: 0.5000 mL | INJECTION | INTRAMUSCULAR | Status: DC | PRN

## 2017-11-14 NOTE — Progress Notes (Signed)
ANTICOAGULATION CONSULT NOTE  Pharmacy Consult for Heparin Indication: multivessel CAD  Heparin Dosing Weight: 97.2 kg  Labs: Recent Labs    11/12/17 0720  11/13/17 0450 11/13/17 0627 11/13/17 1507 11/14/17 0336 11/14/17 0947  HGB 16.1  --  14.3  --   --  14.4  --   HCT 46.7  --  42.9  --   --  42.7  --   PLT 190  --  157  --   --  142*  --   HEPARINUNFRC  --    < >  --  0.24* 0.33  --  0.44  CREATININE 0.73  --   --   --   --   --   --    < > = values in this interval not displayed.    Assessment: 37 yom presenting with jaw pain, CP, shoulder/back pain. Not on anticoagulation PTA. Pt s/p cath which showed multivessel CAD and consulting TCTS for possible CABG.  Pharmacy consulted to resume heparin post cath.  Heparin level therapeutic (0.4) on gtt at 1850 units/hr. No bleeding noted and no issues with infusion noted per RN. CBC relatively stable.  Goal of Therapy:  Heparin level 0.3-0.7 units/ml Monitor platelets by anticoagulation protocol: Yes   Plan:  Continue heparin at 1850 units/hr  Sheppard Coil PharmD., BCPS Clinical Pharmacist 11/14/2017 11:16 AM  **Pharmacist phone directory can now be found on [email or web address] ([email or web address]).  Listed under [facility].

## 2017-11-14 NOTE — Progress Notes (Signed)
CARDIAC REHAB PHASE I   PRE:  Rate/Rhythm: 67 SR    BP: sitting 102/67    SaO2: 97 RA  MODE:  Ambulation: 470 ft   POST:  Rate/Rhythm: 85 SR    BP: sitting 113/73     SaO2:   Pt able to walk in hall without angina. I instructed him to walk lightly (not push himself or leave floor) occasionally before surgery. To notify RN if angina. Discussed sternal precautions, IS (>2500 mL), mobility post op and d/c planning. Good reception. He is anxious regarding waking up on vent and we discussed staying calm and notifying RN. Gave resources for review preop.  [id number]   Harriet Masson CES, ACSM 11/14/2017 3:08 PM

## 2017-11-14 NOTE — Progress Notes (Signed)
2 Days Post-Op Procedure(s) (LRB): LEFT HEART CATH AND CORONARY ANGIOGRAPHY (N/A) Subjective: C/o headache from Imdur  Objective: Vital signs in last 24 hours: Temp:  [98.3 F (36.8 C)-98.7 F (37.1 C)] 98.5 F (36.9 C) (10/16 1420) Pulse Rate:  [70-82] 74 (10/16 1420) Cardiac Rhythm: Normal sinus rhythm (10/16 1400) BP: (102-113)/(63-75) 102/67 (10/16 1420) SpO2:  [96 %-99 %] 98 % (10/16 1420) Weight:  [111.4 kg] 111.4 kg (10/16 0458)  Hemodynamic parameters for last 24 hours:    Intake/Output from previous day: 10/15 0701 - 10/16 0700 In: 998.5 [P.O.:460; I.V.:538.5] Out: -  Intake/Output this shift: Total I/O In: 729.7 [P.O.:600; I.V.:129.7] Out: -   General appearance: alert, cooperative and no distress Neurologic: intact Heart: regular rate and rhythm Lungs: clear to auscultation bilaterally  Lab Results: Recent Labs    11/13/17 0450 11/14/17 0336  WBC 6.1 6.7  HGB 14.3 14.4  HCT 42.9 42.7  PLT 157 142*   BMET:  Recent Labs    11/12/17 0720  NA 136  K 4.2  CL 102  CO2 25  GLUCOSE 153*  BUN 22*  CREATININE 0.73  CALCIUM 9.4    PT/INR: No results for input(s): LABPROT, INR in the last 72 hours. ABG No results found for: PHART, HCO3, TCO2, ACIDBASEDEF, O2SAT CBG (last 3)  Recent Labs    11/13/17 2133 11/14/17 0810 11/14/17 1216  GLUCAP 131* 124* 158*    Assessment/Plan: S/P Procedure(s) (LRB): LEFT HEART CATH AND CORONARY ANGIOGRAPHY (N/A) - Severe 3 vessel CAD  D/w Drs. Patwardhan and Cooper. We are in agreement that CABG gives best chance for a good long term outcome. Will plan CABG with left radial and BIMA on Friday AM    LOS: 0 days    Loreli Slot 11/14/2017

## 2017-11-14 NOTE — Progress Notes (Signed)
Subjective:  Denies chest pain  Objective:  Vital Signs in the last 24 hours: Temp:  [98.3 F (36.8 C)-98.7 F (37.1 C)] 98.3 F (36.8 C) (10/16 0808) Pulse Rate:  [70-77] 70 (10/16 0808) BP: (106-115)/(63-75) 113/75 (10/16 0808) SpO2:  [96 %-99 %] 99 % (10/16 0808) Weight:  [111.4 kg] 111.4 kg (10/16 0458)  Intake/Output from previous day: 10/15 0701 - 10/16 0700 In: 998.5 [P.O.:460; I.V.:538.5] Out: -  Intake/Output from this shift: No intake/output data recorded.  Physical Exam: Nursing note and vitals reviewed. Constitutional: He is oriented to person, place, and time. He appears well-developed and well-nourished.  HENT:  Head: Normocephalic and atraumatic.  Eyes: Pupils are equal, round, and reactive to light. Conjunctivae are normal.  Neck: Normal range of motion. Neck supple. No JVD.  Cardiovascular: Normal rate, regular rhythm and normal heart sounds.  No murmur heard. Respiratory: Effort normal and breath sounds normal. He has no wheezes. He has no rales.  GI: Soft. Bowel sounds are normal. There is no tenderness. There is no rebound.  Musculoskeletal: He exhibits no edema.  Lymphadenopathy:    He has no cervical adenopathy.  Neurological: He is alert and oriented to person, place, and time. No cranial nerve deficit.  Skin: Skin is warm and dry.  Psychiatric: He has a normal mood and affect.   Lab Results: Recent Labs    11/13/17 0450 11/14/17 0336  WBC 6.1 6.7  HGB 14.3 14.4  PLT 157 142*   Recent Labs    11/12/17 0720  NA 136  K 4.2  CL 102  CO2 25  GLUCOSE 153*  BUN 22*  CREATININE 0.73   Cardiac Studies: EKG 11/11/2017: Sinus rhythm. Inferior T wave inversions new  Hospital echocardiogram 11/12/2017;  - Left ventricle: The cavity size was normal. Wall thickness was   normal. Systolic function was normal. The estimated ejection   fraction was in the range of 55% to 60%. Wall motion was normal;   there were no regional wall motion  abnormalities. Left   ventricular diastolic function parameters were normal. - No significant valvular abnormality. - Inadequate TR jet to estimate PASP.  Cath 11/12/2017: LM: Distal calcified 40% stenosis LAD: Prox calcified 40% stenosis        Mid 95% stenosis LCx: Mid long 95% stenosis RCA: Prox 95%, mid to distal long diffuse 75% stenosis  LVEDP normal LVEDP 55-60%  Assessment:  37 y/o Caucasian male with type 2 DM, hypertriglyceridemia (>1000), h/o pancreatitis, prior h/o tobacco and alcohol abuse, recent abnormal stress test with severe inferior, inferolateral ischemia, now admitted with unstable angina  Unstable angina: Severe multivessel CAD. Syntax score 12 Hypertension Type 2 DM Hypertriglyceridemia  Plan: After understanding options for revascularization- including CABG vs PCI, patient has opted to for CABG. With 2, potentially 3 arterial grafts, he will have good long term benefit from CABG. Appreciate Dr. Earmon Phoenix input at patient and family request. Continue current medical therapy. Patient would like to follow up with Dr. Excell Seltzer for continued cardiac care. Continue aspirin, heparin, Imdur, statin, Vascepa.    LOS: 0 days    Andre Ford J Andre Ford 11/14/2017, 10:47 AM  Andre Regino Emiliano Dyer, MD [facility]. [place] Pager: [phone] Office: [phone] If no answer Cell [phone]

## 2017-11-15 ENCOUNTER — Other Ambulatory Visit: Payer: Self-pay

## 2017-11-15 LAB — URINALYSIS, ROUTINE W REFLEX MICROSCOPIC
Bacteria, UA: NONE SEEN
Bilirubin Urine: NEGATIVE
HGB URINE DIPSTICK: NEGATIVE
Ketones, ur: NEGATIVE mg/dL
LEUKOCYTES UA: NEGATIVE
NITRITE: NEGATIVE
PH: 6 (ref 5.0–8.0)
Protein, ur: NEGATIVE mg/dL
SPECIFIC GRAVITY, URINE: 1.02 (ref 1.005–1.030)

## 2017-11-15 LAB — TYPE AND SCREEN
ABO/RH(D): A POS
Antibody Screen: NEGATIVE

## 2017-11-15 LAB — CBC
HCT: 42.8 % (ref 39.0–52.0)
HEMOGLOBIN: 14.3 g/dL (ref 13.0–17.0)
MCH: 28.5 pg (ref 26.0–34.0)
MCHC: 33.4 g/dL (ref 30.0–36.0)
MCV: 85.4 fL (ref 80.0–100.0)
Platelets: 154 10*3/uL (ref 150–400)
RBC: 5.01 MIL/uL (ref 4.22–5.81)
RDW: 13.4 % (ref 11.5–15.5)
WBC: 4.8 10*3/uL (ref 4.0–10.5)
nRBC: 0 % (ref 0.0–0.2)

## 2017-11-15 LAB — HEPARIN LEVEL (UNFRACTIONATED): HEPARIN UNFRACTIONATED: 0.38 [IU]/mL (ref 0.30–0.70)

## 2017-11-15 LAB — PROTIME-INR
INR: 1.02
PROTHROMBIN TIME: 13.3 s (ref 11.4–15.2)

## 2017-11-15 LAB — SURGICAL PCR SCREEN
MRSA, PCR: NEGATIVE
Staphylococcus aureus: NEGATIVE

## 2017-11-15 LAB — ABO/RH: ABO/RH(D): A POS

## 2017-11-15 MED ORDER — PHENYLEPHRINE HCL-NACL 20-0.9 MG/250ML-% IV SOLN
30.0000 ug/min | INTRAVENOUS | Status: AC
  Administered 2017-11-16: 40 ug/min via INTRAVENOUS
  Filled 2017-11-15: qty 250

## 2017-11-15 MED ORDER — TRANEXAMIC ACID 1000 MG/10ML IV SOLN
1.5000 mg/kg/h | INTRAVENOUS | Status: AC
  Administered 2017-11-16: 1.5 mg/kg/h via INTRAVENOUS
  Administered 2017-11-16: 14:00:00 via INTRAVENOUS
  Filled 2017-11-15: qty 25

## 2017-11-15 MED ORDER — BISACODYL 5 MG PO TBEC: 5.0000 mg | DELAYED_RELEASE_TABLET | Freq: Once | ORAL | Status: DC

## 2017-11-15 MED ORDER — NITROGLYCERIN IN D5W 200-5 MCG/ML-% IV SOLN
2.0000 ug/min | INTRAVENOUS | Status: AC
  Administered 2017-11-16: 5 ug/min via INTRAVENOUS
  Filled 2017-11-15: qty 250

## 2017-11-15 MED ORDER — TRANEXAMIC ACID (OHS) BOLUS VIA INFUSION
15.0000 mg/kg | INTRAVENOUS | Status: AC
  Administered 2017-11-16: 1681.5 mg via INTRAVENOUS

## 2017-11-15 MED ORDER — DEXMEDETOMIDINE HCL IN NACL 400 MCG/100ML IV SOLN
0.1000 ug/kg/h | INTRAVENOUS | Status: AC
  Administered 2017-11-16: .4 ug/kg/h via INTRAVENOUS
  Filled 2017-11-15: qty 100

## 2017-11-15 MED ORDER — POTASSIUM CHLORIDE 2 MEQ/ML IV SOLN
80.0000 meq | INTRAVENOUS | Status: DC
  Filled 2017-11-15: qty 40

## 2017-11-15 MED ORDER — EPINEPHRINE PF 1 MG/ML IJ SOLN
0.0000 ug/min | INTRAVENOUS | Status: DC
  Filled 2017-11-15: qty 4

## 2017-11-15 MED ORDER — CHLORHEXIDINE GLUCONATE CLOTH 2 % EX PADS
6.0000 | MEDICATED_PAD | Freq: Once | CUTANEOUS | Status: AC
  Administered 2017-11-16: 6 via TOPICAL

## 2017-11-15 MED ORDER — INSULIN REGULAR(HUMAN) IN NACL 100-0.9 UT/100ML-% IV SOLN
INTRAVENOUS | Status: AC
  Administered 2017-11-16: 2 [IU]/h via INTRAVENOUS
  Filled 2017-11-15: qty 100

## 2017-11-15 MED ORDER — MILRINONE LACTATE IN DEXTROSE 20-5 MG/100ML-% IV SOLN
0.3000 ug/kg/min | INTRAVENOUS | Status: DC
  Filled 2017-11-15: qty 100

## 2017-11-15 MED ORDER — TRANEXAMIC ACID (OHS) PUMP PRIME SOLUTION
2.0000 mg/kg | INTRAVENOUS | Status: DC
  Filled 2017-11-15: qty 2.24

## 2017-11-15 MED ORDER — CHLORHEXIDINE GLUCONATE CLOTH 2 % EX PADS
6.0000 | MEDICATED_PAD | Freq: Once | CUTANEOUS | Status: AC
  Administered 2017-11-15: 6 via TOPICAL

## 2017-11-15 MED ORDER — MAGNESIUM SULFATE 50 % IJ SOLN
40.0000 meq | INTRAMUSCULAR | Status: DC
  Filled 2017-11-15: qty 9.85

## 2017-11-15 MED ORDER — DOPAMINE-DEXTROSE 3.2-5 MG/ML-% IV SOLN
0.0000 ug/kg/min | INTRAVENOUS | Status: DC
  Filled 2017-11-15: qty 250

## 2017-11-15 MED ORDER — SODIUM CHLORIDE 0.9 % IV SOLN
750.0000 mg | INTRAVENOUS | Status: DC
  Filled 2017-11-15: qty 750

## 2017-11-15 MED ORDER — SODIUM CHLORIDE 0.9 % IV SOLN
INTRAVENOUS | Status: DC
  Filled 2017-11-15: qty 30

## 2017-11-15 MED ORDER — SODIUM CHLORIDE 0.9 % IV SOLN
1.5000 g | INTRAVENOUS | Status: AC
  Administered 2017-11-16: 1.5 g via INTRAVENOUS
  Administered 2017-11-16: .75 g via INTRAVENOUS
  Filled 2017-11-15: qty 1.5

## 2017-11-15 MED ORDER — BISACODYL 5 MG PO TBEC
5.0000 mg | DELAYED_RELEASE_TABLET | Freq: Once | ORAL | Status: AC
  Administered 2017-11-15: 5 mg via ORAL
  Filled 2017-11-15: qty 1

## 2017-11-15 MED ORDER — CHLORHEXIDINE GLUCONATE 0.12 % MT SOLN
15.0000 mL | Freq: Once | OROMUCOSAL | Status: AC
  Administered 2017-11-16: 15 mL via OROMUCOSAL
  Filled 2017-11-15: qty 15

## 2017-11-15 MED ORDER — DIAZEPAM 5 MG PO TABS
5.0000 mg | ORAL_TABLET | Freq: Two times a day (BID) | ORAL | Status: DC
  Administered 2017-11-15 (×2): 5 mg via ORAL
  Filled 2017-11-15 (×2): qty 1

## 2017-11-15 MED ORDER — VANCOMYCIN HCL 10 G IV SOLR
1500.0000 mg | INTRAVENOUS | Status: AC
  Administered 2017-11-16: 1500 mg via INTRAVENOUS
  Filled 2017-11-15: qty 1500

## 2017-11-15 MED ORDER — MUPIROCIN 2 % EX OINT
1.0000 "application " | TOPICAL_OINTMENT | Freq: Two times a day (BID) | CUTANEOUS | Status: AC
  Administered 2017-11-15 – 2017-11-17 (×4): 1 via NASAL
  Filled 2017-11-15 (×3): qty 22

## 2017-11-15 MED ORDER — METOPROLOL TARTRATE 12.5 MG HALF TABLET
12.5000 mg | ORAL_TABLET | Freq: Once | ORAL | Status: AC
  Administered 2017-11-16: 12.5 mg via ORAL
  Filled 2017-11-15: qty 1

## 2017-11-15 MED ORDER — PLASMA-LYTE 148 IV SOLN
INTRAVENOUS | Status: DC
  Filled 2017-11-15: qty 2.5

## 2017-11-15 NOTE — Progress Notes (Signed)
3 Days Post-Op Procedure(s) (LRB): LEFT HEART CATH AND CORONARY ANGIOGRAPHY (N/A) Subjective: No complaints. More anxious as surgery approaches  Objective: Vital signs in last 24 hours: Temp:  [97.4 F (36.3 C)-98.5 F (36.9 C)] 98.5 F (36.9 C) (10/17 1445) Pulse Rate:  [66-74] 74 (10/17 1445) Cardiac Rhythm: Normal sinus rhythm (10/17 0810) BP: (102-120)/(67-84) 102/67 (10/17 1445) SpO2:  [97 %-99 %] 97 % (10/17 1445) Weight:  [112.1 kg] 112.1 kg (10/17 0539)  Hemodynamic parameters for last 24 hours:    Intake/Output from previous day: 10/16 0701 - 10/17 0700 In: 970.2 [P.O.:600; I.V.:370.2] Out: -  Intake/Output this shift: Total I/O In: 240 [P.O.:240] Out: -   General appearance: alert, cooperative and no distress Neurologic: intact Heart: regular rate and rhythm  Lab Results: Recent Labs    11/14/17 0336 11/15/17 0520  WBC 6.7 4.8  HGB 14.4 14.3  HCT 42.7 42.8  PLT 142* 154   BMET: No results for input(s): NA, K, CL, CO2, GLUCOSE, BUN, CREATININE, CALCIUM in the last 72 hours.  PT/INR:  Recent Labs    11/15/17 1134  LABPROT 13.3  INR 1.02   ABG No results found for: PHART, HCO3, TCO2, ACIDBASEDEF, O2SAT CBG (last 3)  Recent Labs    11/14/17 1216 11/14/17 1650 11/14/17 2115  GLUCAP 158* 116* 127*    Assessment/Plan: S/P Procedure(s) (LRB): LEFT HEART CATH AND CORONARY ANGIOGRAPHY (N/A) -3 vessel CAD For CABG with BIMA and left radial tomorrow All questions answered He is aware of the risks and benefits   LOS: 1 day    Loreli Slot 11/15/2017

## 2017-11-15 NOTE — Progress Notes (Signed)
ANTICOAGULATION CONSULT NOTE  Pharmacy Consult for Heparin Indication: multivessel CAD  Heparin Dosing Weight: 97.2 kg  Labs: Recent Labs    11/13/17 0450  11/13/17 1507 11/14/17 0336 11/14/17 0947 11/15/17 0520  HGB 14.3  --   --  14.4  --  14.3  HCT 42.9  --   --  42.7  --  42.8  PLT 157  --   --  142*  --  154  HEPARINUNFRC  --    < > 0.33  --  0.44 0.38   < > = values in this interval not displayed.    Assessment: 37 yom presenting with jaw pain, CP, shoulder/back pain. Not on anticoagulation PTA. Pt s/p cath which showed multivessel CAD and plan for CABG 10/18.  Pharmacy consulted to resume heparin post cath.  Heparin level therapeutic (0.38) on gtt at 1850 units/hr. No bleeding noted and no issues with infusion noted per RN. CBC stable.  Goal of Therapy:  Heparin level 0.3-0.7 units/ml Monitor platelets by anticoagulation protocol: Yes   Plan:  Continue heparin at 1850 units/hr Daily heparin level and CBC CABG 10/18  Christoper Fabian, PharmD, BCPS Clinical pharmacist  **Pharmacist phone directory can now be found on [email or web address] ([email or web address]).  Listed under [facility]. 11/15/2017 8:45 AM

## 2017-11-15 NOTE — Progress Notes (Signed)
Pt has been up in room and took shower last night without angina. He did awake with angina this am that was relieved with xanax and getting HR higher. Reviewed IS, etc. He will be up and about in room today. His family (kids) just arrived. Will f/u post op. [id number] Ethelda Chick CES, ACSM 11:02 AM 11/15/2017

## 2017-11-15 NOTE — Progress Notes (Signed)
Subjective:  Denies chest pain.  He is in good spirits today with his family around.  However, he is anxious about the surgery tomorrow.  Requests additional medication to relieve anxiety.  Objective:  Vital Signs in the last 24 hours: Temp:  [97.4 F (36.3 C)-98.5 F (36.9 C)] 97.9 F (36.6 C) (10/17 0539) Pulse Rate:  [66-82] 67 (10/17 0539) BP: (102-120)/(67-84) 106/79 (10/17 0539) SpO2:  [97 %-99 %] 99 % (10/17 0539) Weight:  [112.1 kg] 112.1 kg (10/17 0539)  Intake/Output from previous day: 10/16 0701 - 10/17 0700 In: 970.2 [P.O.:600; I.V.:370.2] Out: -  Intake/Output from this shift: Total I/O In: 240 [P.O.:240] Out: -   Physical Exam: Nursing note and vitals reviewed. Constitutional: He is oriented to person, place, and time. He appears well-developed and well-nourished.  HENT:  Head: Normocephalic and atraumatic.  Eyes: Pupils are equal, round, and reactive to light. Conjunctivae are normal.  Neck: Normal range of motion. Neck supple. No JVD.  Cardiovascular: Normal rate, regular rhythm and normal heart sounds.  No murmur heard. Respiratory: Effort normal and breath sounds normal. He has no wheezes. He has no rales.  GI: Soft. Bowel sounds are normal. There is no tenderness. There is no rebound.  Musculoskeletal: He exhibits no edema.  Lymphadenopathy:    He has no cervical adenopathy.  Neurological: He is alert and oriented to person, place, and time. No cranial nerve deficit.  Skin: Skin is warm and dry.  Psychiatric: He has a normal mood and affect.   Lab Results: Recent Labs    11/14/17 0336 11/15/17 0520  WBC 6.7 4.8  HGB 14.4 14.3  PLT 142* 154   No results for input(s): NA, K, CL, CO2, GLUCOSE, BUN, CREATININE in the last 72 hours. Cardiac Studies: EKG 11/11/2017: Sinus rhythm. Inferior T wave inversions new  Hospital echocardiogram 11/12/2017;  - Left ventricle: The cavity size was normal. Wall thickness was   normal. Systolic function was  normal. The estimated ejection   fraction was in the range of 55% to 60%. Wall motion was normal;   there were no regional wall motion abnormalities. Left   ventricular diastolic function parameters were normal. - No significant valvular abnormality. - Inadequate TR jet to estimate PASP.  Cath 11/12/2017: LM: Distal calcified 40% stenosis LAD: Prox calcified 40% stenosis        Mid 95% stenosis LCx: Mid long 95% stenosis RCA: Prox 95%, mid to distal long diffuse 75% stenosis  LVEDP normal LVEDP 55-60%  Assessment:  37 y/o Caucasian male with type 2 DM, hypertriglyceridemia (>1000), h/o pancreatitis, prior h/o tobacco and alcohol abuse, recent abnormal stress test with severe inferior, inferolateral ischemia, now admitted with unstable angina  Unstable angina: Severe multivessel CAD. Syntax score 12 Hypertension Type 2 DM Hypertriglyceridemia  Plan: After understanding options for revascularization- including CABG vs PCI, patient has opted to for CABG. With 2, potentially 3 arterial grafts, he will have good long term benefit from CABG. Appreciate Dr. Earmon Phoenix input at patient and family request. Continue current medical therapy. Patient would like to follow up with Dr. Excell Seltzer for continued cardiac care. Continue aspirin, heparin, Imdur, statin, Vascepa.  Valium 5 mg bid given his anxiety. Continue using incentive spirometry.  Patient will be followed by Dr. Excell Seltzer after his surgery.    LOS: 1 day    Earnest Mcgillis J Andromeda Poppen 11/15/2017, 1:09 PM  Martrice Apt Emiliano Dyer, MD [facility] [place] Pager: [phone] Office: [phone] If no answer Cell [phone]

## 2017-11-15 NOTE — Progress Notes (Signed)
Pt complained of back and shoulder pain and tightness. He explains it comes and goes and he believes it is just anxiety. EKG done and pt given PRN Xanax. Will continue to monitor.

## 2017-11-16 ENCOUNTER — Encounter (HOSPITAL_COMMUNITY): Payer: Self-pay | Admitting: Certified Registered"

## 2017-11-16 ENCOUNTER — Inpatient Hospital Stay (HOSPITAL_COMMUNITY): Payer: BLUE CROSS/BLUE SHIELD | Admitting: Anesthesiology

## 2017-11-16 ENCOUNTER — Inpatient Hospital Stay (HOSPITAL_COMMUNITY): Payer: BLUE CROSS/BLUE SHIELD

## 2017-11-16 ENCOUNTER — Inpatient Hospital Stay (HOSPITAL_COMMUNITY)
Admission: EM | Disposition: A | Discharge status: Home / Self Care | Payer: Self-pay | Source: Home / Self Care | Attending: Thoracic Surgery (Cardiothoracic Vascular Surgery)

## 2017-11-16 DIAGNOSIS — I2510 Atherosclerotic heart disease of native coronary artery without angina pectoris: Secondary | ICD-10-CM | POA: Diagnosis present

## 2017-11-16 DIAGNOSIS — Z951 Presence of aortocoronary bypass graft: Secondary | ICD-10-CM

## 2017-11-16 HISTORY — PX: TEE WITHOUT CARDIOVERSION: SHX5443

## 2017-11-16 HISTORY — PX: CORONARY ARTERY BYPASS GRAFT: SHX141

## 2017-11-16 HISTORY — PX: CYSTOSCOPY WITH URETHRAL DILATATION: SHX5125

## 2017-11-16 HISTORY — PX: RADIAL ARTERY HARVEST: SHX5067

## 2017-11-16 LAB — POCT I-STAT, CHEM 8
BUN: 9 mg/dL (ref 6–20)
BUN: 9 mg/dL (ref 6–20)
BUN: 7 mg/dL (ref 6–20)
BUN: 8 mg/dL (ref 6–20)
BUN: 7 mg/dL (ref 6–20)
BUN: 7 mg/dL (ref 6–20)
BUN: 6 mg/dL (ref 6–20)
CALCIUM ION: 1.13 mmol/L — AB (ref 1.15–1.40)
CALCIUM ION: 1.16 mmol/L (ref 1.15–1.40)
CALCIUM ION: 1.23 mmol/L (ref 1.15–1.40)
CHLORIDE: 102 mmol/L (ref 98–111)
CHLORIDE: 101 mmol/L (ref 98–111)
CHLORIDE: 103 mmol/L (ref 98–111)
CHLORIDE: 103 mmol/L (ref 98–111)
CREATININE: 0.6 mg/dL — AB (ref 0.61–1.24)
CREATININE: 0.5 mg/dL — AB (ref 0.61–1.24)
CREATININE: 0.5 mg/dL — AB (ref 0.61–1.24)
CREATININE: 0.5 mg/dL — AB (ref 0.61–1.24)
Calcium, Ion: 1.25 mmol/L (ref 1.15–1.40)
Calcium, Ion: 1.28 mmol/L (ref 1.15–1.40)
Calcium, Ion: 1.07 mmol/L — ABNORMAL LOW (ref 1.15–1.40)
Calcium, Ion: 1.17 mmol/L (ref 1.15–1.40)
Chloride: 104 mmol/L (ref 98–111)
Chloride: 102 mmol/L (ref 98–111)
Chloride: 105 mmol/L (ref 98–111)
Creatinine, Ser: 0.5 mg/dL — ABNORMAL LOW (ref 0.61–1.24)
Creatinine, Ser: 0.6 mg/dL — ABNORMAL LOW (ref 0.61–1.24)
Creatinine, Ser: 0.5 mg/dL — ABNORMAL LOW (ref 0.61–1.24)
GLUCOSE: 129 mg/dL — AB (ref 70–99)
GLUCOSE: 124 mg/dL — AB (ref 70–99)
GLUCOSE: 164 mg/dL — AB (ref 70–99)
GLUCOSE: 162 mg/dL — AB (ref 70–99)
Glucose, Bld: 160 mg/dL — ABNORMAL HIGH (ref 70–99)
Glucose, Bld: 182 mg/dL — ABNORMAL HIGH (ref 70–99)
Glucose, Bld: 202 mg/dL — ABNORMAL HIGH (ref 70–99)
HCT: 41 % (ref 39.0–52.0)
HCT: 33 % — ABNORMAL LOW (ref 39.0–52.0)
HCT: 31 % — ABNORMAL LOW (ref 39.0–52.0)
HCT: 33 % — ABNORMAL LOW (ref 39.0–52.0)
HCT: 34 % — ABNORMAL LOW (ref 39.0–52.0)
HEMATOCRIT: 40 % (ref 39.0–52.0)
HEMATOCRIT: 32 % — AB (ref 39.0–52.0)
Hemoglobin: 13.9 g/dL (ref 13.0–17.0)
Hemoglobin: 13.6 g/dL (ref 13.0–17.0)
Hemoglobin: 11.2 g/dL — ABNORMAL LOW (ref 13.0–17.0)
Hemoglobin: 10.5 g/dL — ABNORMAL LOW (ref 13.0–17.0)
Hemoglobin: 10.9 g/dL — ABNORMAL LOW (ref 13.0–17.0)
Hemoglobin: 11.2 g/dL — ABNORMAL LOW (ref 13.0–17.0)
Hemoglobin: 11.6 g/dL — ABNORMAL LOW (ref 13.0–17.0)
POTASSIUM: 4.3 mmol/L (ref 3.5–5.1)
Potassium: 4.3 mmol/L (ref 3.5–5.1)
Potassium: 4.5 mmol/L (ref 3.5–5.1)
Potassium: 4.5 mmol/L (ref 3.5–5.1)
Potassium: 3.8 mmol/L (ref 3.5–5.1)
Potassium: 4.7 mmol/L (ref 3.5–5.1)
Potassium: 4.4 mmol/L (ref 3.5–5.1)
SODIUM: 137 mmol/L (ref 135–145)
SODIUM: 137 mmol/L (ref 135–145)
SODIUM: 139 mmol/L (ref 135–145)
SODIUM: 137 mmol/L (ref 135–145)
Sodium: 139 mmol/L (ref 135–145)
Sodium: 139 mmol/L (ref 135–145)
Sodium: 136 mmol/L (ref 135–145)
TCO2: 29 mmol/L (ref 22–32)
TCO2: 28 mmol/L (ref 22–32)
TCO2: 28 mmol/L (ref 22–32)
TCO2: 28 mmol/L (ref 22–32)
TCO2: 25 mmol/L (ref 22–32)
TCO2: 26 mmol/L (ref 22–32)
TCO2: 24 mmol/L (ref 22–32)

## 2017-11-16 LAB — POCT I-STAT 3, ART BLOOD GAS (G3+)
ACID-BASE DEFICIT: 1 mmol/L (ref 0.0–2.0)
ACID-BASE DEFICIT: 1 mmol/L (ref 0.0–2.0)
ACID-BASE DEFICIT: 2 mmol/L (ref 0.0–2.0)
ACID-BASE EXCESS: 1 mmol/L (ref 0.0–2.0)
Acid-base deficit: 2 mmol/L (ref 0.0–2.0)
BICARBONATE: 27.4 mmol/L (ref 20.0–28.0)
BICARBONATE: 24.6 mmol/L (ref 20.0–28.0)
BICARBONATE: 23.5 mmol/L (ref 20.0–28.0)
BICARBONATE: 24.2 mmol/L (ref 20.0–28.0)
Bicarbonate: 24.8 mmol/L (ref 20.0–28.0)
O2 SAT: 100 %
O2 SAT: 100 %
O2 SAT: 99 %
O2 SAT: 99 %
O2 Saturation: 97 %
PCO2 ART: 46.8 mmHg (ref 32.0–48.0)
PCO2 ART: 44.5 mmHg (ref 32.0–48.0)
PH ART: 7.349 — AB (ref 7.350–7.450)
PO2 ART: 353 mmHg — AB (ref 83.0–108.0)
PO2 ART: 180 mmHg — AB (ref 83.0–108.0)
PO2 ART: 96 mmHg (ref 83.0–108.0)
PO2 ART: 124 mmHg — AB (ref 83.0–108.0)
PO2 ART: 131 mmHg — AB (ref 83.0–108.0)
Patient temperature: 37
Patient temperature: 37.4
TCO2: 29 mmol/L (ref 22–32)
TCO2: 26 mmol/L (ref 22–32)
TCO2: 25 mmol/L (ref 22–32)
TCO2: 26 mmol/L (ref 22–32)
TCO2: 25 mmol/L (ref 22–32)
pCO2 arterial: 50.5 mmHg — ABNORMAL HIGH (ref 32.0–48.0)
pCO2 arterial: 44.3 mmHg (ref 32.0–48.0)
pCO2 arterial: 42.4 mmHg (ref 32.0–48.0)
pH, Arterial: 7.343 — ABNORMAL LOW (ref 7.350–7.450)
pH, Arterial: 7.353 (ref 7.350–7.450)
pH, Arterial: 7.332 — ABNORMAL LOW (ref 7.350–7.450)
pH, Arterial: 7.344 — ABNORMAL LOW (ref 7.350–7.450)

## 2017-11-16 LAB — CBC
HEMATOCRIT: 45 % (ref 39.0–52.0)
HEMATOCRIT: 37.3 % — AB (ref 39.0–52.0)
HEMOGLOBIN: 15.3 g/dL (ref 13.0–17.0)
Hemoglobin: 12.8 g/dL — ABNORMAL LOW (ref 13.0–17.0)
MCH: 29 pg (ref 26.0–34.0)
MCH: 29.6 pg (ref 26.0–34.0)
MCHC: 34 g/dL (ref 30.0–36.0)
MCHC: 34.3 g/dL (ref 30.0–36.0)
MCV: 85.4 fL (ref 80.0–100.0)
MCV: 86.1 fL (ref 80.0–100.0)
NRBC: 0 % (ref 0.0–0.2)
Platelets: 153 10*3/uL (ref 150–400)
Platelets: 103 10*3/uL — ABNORMAL LOW (ref 150–400)
RBC: 5.27 MIL/uL (ref 4.22–5.81)
RBC: 4.33 MIL/uL (ref 4.22–5.81)
RDW: 13.5 % (ref 11.5–15.5)
RDW: 13.3 % (ref 11.5–15.5)
WBC: 5.2 10*3/uL (ref 4.0–10.5)
WBC: 9.7 10*3/uL (ref 4.0–10.5)
nRBC: 0 % (ref 0.0–0.2)

## 2017-11-16 LAB — HEMOGLOBIN AND HEMATOCRIT, BLOOD
HCT: 32 % — ABNORMAL LOW (ref 39.0–52.0)
HEMOGLOBIN: 11.3 g/dL — AB (ref 13.0–17.0)

## 2017-11-16 LAB — POCT I-STAT 4, (NA,K, GLUC, HGB,HCT)
Glucose, Bld: 131 mg/dL — ABNORMAL HIGH (ref 70–99)
HCT: 35 % — ABNORMAL LOW (ref 39.0–52.0)
HEMOGLOBIN: 11.9 g/dL — AB (ref 13.0–17.0)
POTASSIUM: 3.9 mmol/L (ref 3.5–5.1)
SODIUM: 141 mmol/L (ref 135–145)

## 2017-11-16 LAB — APTT: APTT: 26 s (ref 24–36)

## 2017-11-16 LAB — PROTIME-INR
INR: 1.34
Prothrombin Time: 16.4 seconds — ABNORMAL HIGH (ref 11.4–15.2)

## 2017-11-16 LAB — HEPARIN LEVEL (UNFRACTIONATED): Heparin Unfractionated: 0.35 IU/mL (ref 0.30–0.70)

## 2017-11-16 LAB — PLATELET COUNT: PLATELETS: 156 10*3/uL (ref 150–400)

## 2017-11-16 SURGERY — CORONARY ARTERY BYPASS GRAFTING (CABG)
Anesthesia: General | Site: Chest

## 2017-11-16 MED ORDER — SODIUM CHLORIDE 0.9% FLUSH
10.0000 mL | Freq: Two times a day (BID) | INTRAVENOUS | Status: DC
  Administered 2017-11-16 – 2017-11-18 (×4): 10 mL

## 2017-11-16 MED ORDER — ASPIRIN EC 325 MG PO TBEC
325.0000 mg | DELAYED_RELEASE_TABLET | Freq: Every day | ORAL | Status: DC
  Administered 2017-11-17 – 2017-11-21 (×5): 325 mg via ORAL
  Filled 2017-11-16 (×5): qty 1

## 2017-11-16 MED ORDER — NITROGLYCERIN IN D5W 200-5 MCG/ML-% IV SOLN: 0.0000 ug/min | INTRAVENOUS | Status: DC

## 2017-11-16 MED ORDER — BISACODYL 5 MG PO TBEC
10.0000 mg | DELAYED_RELEASE_TABLET | Freq: Every day | ORAL | Status: DC
  Administered 2017-11-17 – 2017-11-21 (×4): 10 mg via ORAL
  Filled 2017-11-16 (×4): qty 2

## 2017-11-16 MED ORDER — PLASMA-LYTE 148 IV SOLN
INTRAVENOUS | Status: DC | PRN
  Administered 2017-11-16: 500 mL via INTRAVASCULAR

## 2017-11-16 MED ORDER — HEPARIN SODIUM (PORCINE) 1000 UNIT/ML IJ SOLN
INTRAMUSCULAR | Status: DC | PRN
  Administered 2017-11-16: 38 mL via INTRAVENOUS
  Administered 2017-11-16: 2 mL via INTRAVENOUS

## 2017-11-16 MED ORDER — ARTIFICIAL TEARS OPHTHALMIC OINT
TOPICAL_OINTMENT | OPHTHALMIC | Status: AC
  Filled 2017-11-16: qty 3.5

## 2017-11-16 MED ORDER — HEMOSTATIC AGENTS (NO CHARGE) OPTIME
TOPICAL | Status: DC | PRN
  Administered 2017-11-16 (×3): 1 via TOPICAL

## 2017-11-16 MED ORDER — OXYCODONE HCL 5 MG PO TABS
5.0000 mg | ORAL_TABLET | ORAL | Status: DC | PRN
  Administered 2017-11-16 – 2017-11-19 (×6): 10 mg via ORAL
  Filled 2017-11-16 (×6): qty 2

## 2017-11-16 MED ORDER — PHENYLEPHRINE HCL-NACL 20-0.9 MG/250ML-% IV SOLN
0.0000 ug/min | INTRAVENOUS | Status: DC
  Administered 2017-11-16: 0 ug/min via INTRAVENOUS
  Filled 2017-11-16: qty 250

## 2017-11-16 MED ORDER — MORPHINE SULFATE (PF) 2 MG/ML IV SOLN
1.0000 mg | INTRAVENOUS | Status: DC | PRN
  Administered 2017-11-16: 4 mg via INTRAVENOUS
  Filled 2017-11-16: qty 2

## 2017-11-16 MED ORDER — FENTANYL CITRATE (PF) 250 MCG/5ML IJ SOLN
INTRAMUSCULAR | Status: AC
  Filled 2017-11-16: qty 5

## 2017-11-16 MED ORDER — PROTAMINE SULFATE 10 MG/ML IV SOLN
INTRAVENOUS | Status: AC
  Filled 2017-11-16: qty 10

## 2017-11-16 MED ORDER — PHENYLEPHRINE 40 MCG/ML (10ML) SYRINGE FOR IV PUSH (FOR BLOOD PRESSURE SUPPORT)
PREFILLED_SYRINGE | INTRAVENOUS | Status: AC
  Filled 2017-11-16: qty 10

## 2017-11-16 MED ORDER — VANCOMYCIN HCL IN DEXTROSE 1-5 GM/200ML-% IV SOLN
1000.0000 mg | Freq: Once | INTRAVENOUS | Status: AC
  Administered 2017-11-16: 1000 mg via INTRAVENOUS
  Filled 2017-11-16: qty 200

## 2017-11-16 MED ORDER — FAMOTIDINE IN NACL 20-0.9 MG/50ML-% IV SOLN
20.0000 mg | Freq: Two times a day (BID) | INTRAVENOUS | Status: DC
  Administered 2017-11-16: 20 mg via INTRAVENOUS

## 2017-11-16 MED ORDER — PROTAMINE SULFATE 10 MG/ML IV SOLN
INTRAVENOUS | Status: AC
  Filled 2017-11-16: qty 25

## 2017-11-16 MED ORDER — ACETAMINOPHEN 650 MG RE SUPP
650.0000 mg | Freq: Once | RECTAL | Status: AC
  Administered 2017-11-16: 650 mg via RECTAL

## 2017-11-16 MED ORDER — ISOSORBIDE MONONITRATE ER 30 MG PO TB24
15.0000 mg | ORAL_TABLET | Freq: Every day | ORAL | Status: DC
  Administered 2017-11-17 – 2017-11-21 (×5): 15 mg via ORAL
  Filled 2017-11-16 (×5): qty 1

## 2017-11-16 MED ORDER — ESCITALOPRAM OXALATE 10 MG PO TABS
10.0000 mg | ORAL_TABLET | Freq: Every day | ORAL | Status: DC
  Administered 2017-11-17 – 2017-11-21 (×5): 10 mg via ORAL
  Filled 2017-11-16 (×5): qty 1

## 2017-11-16 MED ORDER — SODIUM CHLORIDE 0.9% FLUSH: 10.0000 mL | INTRAVENOUS | Status: DC | PRN

## 2017-11-16 MED ORDER — ONDANSETRON HCL 4 MG/2ML IJ SOLN
4.0000 mg | Freq: Four times a day (QID) | INTRAMUSCULAR | Status: DC | PRN
  Administered 2017-11-18: 4 mg via INTRAVENOUS
  Filled 2017-11-16: qty 2

## 2017-11-16 MED ORDER — SODIUM CHLORIDE 0.9% FLUSH: 3.0000 mL | INTRAVENOUS | Status: DC | PRN

## 2017-11-16 MED ORDER — VECURONIUM BROMIDE 10 MG IV SOLR
INTRAVENOUS | Status: AC
  Filled 2017-11-16: qty 10

## 2017-11-16 MED ORDER — PANTOPRAZOLE SODIUM 40 MG PO TBEC
40.0000 mg | DELAYED_RELEASE_TABLET | Freq: Every day | ORAL | Status: DC
  Administered 2017-11-18 – 2017-11-21 (×4): 40 mg via ORAL
  Filled 2017-11-16 (×4): qty 1

## 2017-11-16 MED ORDER — LACTATED RINGERS IV SOLN: 500.0000 mL | Freq: Once | INTRAVENOUS | Status: DC | PRN

## 2017-11-16 MED ORDER — VECURONIUM BROMIDE 10 MG IV SOLR
INTRAVENOUS | Status: DC | PRN
  Administered 2017-11-16 (×3): 10 mg via INTRAVENOUS
  Administered 2017-11-16: 5 mg via INTRAVENOUS
  Administered 2017-11-16: 10 mg via INTRAVENOUS

## 2017-11-16 MED ORDER — LIDOCAINE 2% (20 MG/ML) 5 ML SYRINGE
INTRAMUSCULAR | Status: AC
  Filled 2017-11-16: qty 5

## 2017-11-16 MED ORDER — ASPIRIN 81 MG PO CHEW: 324.0000 mg | CHEWABLE_TABLET | Freq: Every day | ORAL | Status: DC

## 2017-11-16 MED ORDER — MIDAZOLAM HCL 10 MG/2ML IJ SOLN
INTRAMUSCULAR | Status: AC
  Filled 2017-11-16: qty 2

## 2017-11-16 MED ORDER — METOPROLOL TARTRATE 25 MG/10 ML ORAL SUSPENSION: 12.5000 mg | Freq: Two times a day (BID) | ORAL | Status: DC

## 2017-11-16 MED ORDER — SODIUM CHLORIDE 0.9 % IV SOLN: INTRAVENOUS | Status: DC

## 2017-11-16 MED ORDER — ACETAMINOPHEN 500 MG PO TABS
1000.0000 mg | ORAL_TABLET | Freq: Four times a day (QID) | ORAL | Status: DC
  Administered 2017-11-16 – 2017-11-21 (×16): 1000 mg via ORAL
  Filled 2017-11-16 (×16): qty 2

## 2017-11-16 MED ORDER — INSULIN REGULAR BOLUS VIA INFUSION
0.0000 [IU] | Freq: Three times a day (TID) | INTRAVENOUS | Status: DC
  Filled 2017-11-16: qty 10

## 2017-11-16 MED ORDER — MAGNESIUM SULFATE 4 GM/100ML IV SOLN
4.0000 g | Freq: Once | INTRAVENOUS | Status: AC
  Administered 2017-11-16: 4 g via INTRAVENOUS

## 2017-11-16 MED ORDER — HEPARIN SODIUM (PORCINE) 1000 UNIT/ML IJ SOLN
INTRAMUSCULAR | Status: AC
  Filled 2017-11-16: qty 1

## 2017-11-16 MED ORDER — TRANEXAMIC ACID 1000 MG/10ML IV SOLN
1.5000 mg/kg/h | INTRAVENOUS | Status: DC
  Filled 2017-11-16: qty 25

## 2017-11-16 MED ORDER — DEXMEDETOMIDINE HCL IN NACL 200 MCG/50ML IV SOLN
0.0000 ug/kg/h | INTRAVENOUS | Status: DC
  Filled 2017-11-16: qty 50

## 2017-11-16 MED ORDER — MIDAZOLAM HCL 5 MG/5ML IJ SOLN
INTRAMUSCULAR | Status: DC | PRN
  Administered 2017-11-16: 2 mg via INTRAVENOUS
  Administered 2017-11-16: 3 mg via INTRAVENOUS
  Administered 2017-11-16: 2 mg via INTRAVENOUS
  Administered 2017-11-16: 1 mg via INTRAVENOUS
  Administered 2017-11-16: 2 mg via INTRAVENOUS

## 2017-11-16 MED ORDER — SODIUM CHLORIDE 0.9 % IV SOLN: 250.0000 mL | INTRAVENOUS | Status: DC

## 2017-11-16 MED ORDER — DEXMEDETOMIDINE HCL IN NACL 400 MCG/100ML IV SOLN
0.0000 ug/kg/h | INTRAVENOUS | Status: DC
  Administered 2017-11-16: 0.7 ug/kg/h via INTRAVENOUS
  Filled 2017-11-16: qty 100

## 2017-11-16 MED ORDER — LACTATED RINGERS IV SOLN
INTRAVENOUS | Status: DC | PRN
  Administered 2017-11-16 (×2): via INTRAVENOUS

## 2017-11-16 MED ORDER — CHLORHEXIDINE GLUCONATE 0.12 % MT SOLN
15.0000 mL | OROMUCOSAL | Status: AC
  Administered 2017-11-16: 15 mL via OROMUCOSAL
  Filled 2017-11-16: qty 15

## 2017-11-16 MED ORDER — SODIUM CHLORIDE 0.9 % IV SOLN
1.5000 g | Freq: Two times a day (BID) | INTRAVENOUS | Status: AC
  Administered 2017-11-16 – 2017-11-18 (×4): 1.5 g via INTRAVENOUS
  Filled 2017-11-16 (×5): qty 1.5

## 2017-11-16 MED ORDER — ETOMIDATE 2 MG/ML IV SOLN
INTRAVENOUS | Status: AC
  Filled 2017-11-16: qty 10

## 2017-11-16 MED ORDER — SODIUM CHLORIDE 0.9% FLUSH
3.0000 mL | Freq: Two times a day (BID) | INTRAVENOUS | Status: DC
  Administered 2017-11-17 – 2017-11-19 (×3): 3 mL via INTRAVENOUS

## 2017-11-16 MED ORDER — DOCUSATE SODIUM 100 MG PO CAPS
200.0000 mg | ORAL_CAPSULE | Freq: Every day | ORAL | Status: DC
  Administered 2017-11-17 – 2017-11-19 (×3): 200 mg via ORAL
  Filled 2017-11-16 (×5): qty 2

## 2017-11-16 MED ORDER — FENTANYL CITRATE (PF) 250 MCG/5ML IJ SOLN
INTRAMUSCULAR | Status: DC | PRN
  Administered 2017-11-16: 250 ug via INTRAVENOUS
  Administered 2017-11-16: 500 ug via INTRAVENOUS
  Administered 2017-11-16: 250 ug via INTRAVENOUS
  Administered 2017-11-16: 100 ug via INTRAVENOUS
  Administered 2017-11-16 (×2): 50 ug via INTRAVENOUS
  Administered 2017-11-16: 150 ug via INTRAVENOUS
  Administered 2017-11-16 (×2): 250 ug via INTRAVENOUS
  Administered 2017-11-16: 50 ug via INTRAVENOUS

## 2017-11-16 MED ORDER — NITROGLYCERIN IN D5W 200-5 MCG/ML-% IV SOLN
7.0000 ug/min | INTRAVENOUS | Status: DC
  Administered 2017-11-16: 7 ug/min via INTRAVENOUS

## 2017-11-16 MED ORDER — PROTAMINE SULFATE 10 MG/ML IV SOLN
INTRAVENOUS | Status: AC
  Filled 2017-11-16: qty 5

## 2017-11-16 MED ORDER — ONDANSETRON HCL 4 MG/2ML IJ SOLN
INTRAMUSCULAR | Status: AC
  Filled 2017-11-16: qty 2

## 2017-11-16 MED ORDER — ACETAMINOPHEN 160 MG/5ML PO SOLN: 650.0000 mg | Freq: Once | ORAL | Status: AC

## 2017-11-16 MED ORDER — MIDAZOLAM HCL 2 MG/2ML IJ SOLN: 2.0000 mg | INTRAMUSCULAR | Status: DC | PRN

## 2017-11-16 MED ORDER — ALBUMIN HUMAN 5 % IV SOLN
INTRAVENOUS | Status: DC | PRN
  Administered 2017-11-16 (×2): via INTRAVENOUS

## 2017-11-16 MED ORDER — LACTATED RINGERS IV SOLN
INTRAVENOUS | Status: DC
  Administered 2017-11-16: 20 mL/h via INTRAVENOUS
  Administered 2017-11-16: 23:00:00 via INTRAVENOUS

## 2017-11-16 MED ORDER — 0.9 % SODIUM CHLORIDE (POUR BTL) OPTIME
TOPICAL | Status: DC | PRN
  Administered 2017-11-16: 1000 mL
  Administered 2017-11-16: 5000 mL

## 2017-11-16 MED ORDER — CHLORHEXIDINE GLUCONATE CLOTH 2 % EX PADS
6.0000 | MEDICATED_PAD | Freq: Every day | CUTANEOUS | Status: DC
  Administered 2017-11-17 – 2017-11-19 (×2): 6 via TOPICAL

## 2017-11-16 MED ORDER — SODIUM CHLORIDE 0.45 % IV SOLN
INTRAVENOUS | Status: DC | PRN
  Administered 2017-11-16: 17:00:00 via INTRAVENOUS

## 2017-11-16 MED ORDER — PROPOFOL 10 MG/ML IV BOLUS
INTRAVENOUS | Status: AC
  Filled 2017-11-16: qty 20

## 2017-11-16 MED ORDER — INSULIN REGULAR(HUMAN) IN NACL 100-0.9 UT/100ML-% IV SOLN
INTRAVENOUS | Status: DC
  Administered 2017-11-16: 100 [IU] via INTRAVENOUS

## 2017-11-16 MED ORDER — PROPOFOL 10 MG/ML IV BOLUS
INTRAVENOUS | Status: DC | PRN
  Administered 2017-11-16: 120 mg via INTRAVENOUS
  Administered 2017-11-16: 50 mg via INTRAVENOUS
  Administered 2017-11-16: 80 mg via INTRAVENOUS

## 2017-11-16 MED ORDER — LACTATED RINGERS IV SOLN
INTRAVENOUS | Status: DC | PRN
  Administered 2017-11-16 (×2): via INTRAVENOUS

## 2017-11-16 MED ORDER — OMEGA-3-ACID ETHYL ESTERS 1 G PO CAPS
2.0000 g | ORAL_CAPSULE | Freq: Two times a day (BID) | ORAL | Status: DC
  Administered 2017-11-18 – 2017-11-21 (×7): 2 g via ORAL
  Filled 2017-11-16 (×7): qty 2

## 2017-11-16 MED ORDER — ACETAMINOPHEN 160 MG/5ML PO SOLN: 1000.0000 mg | Freq: Four times a day (QID) | ORAL | Status: DC

## 2017-11-16 MED ORDER — ALBUMIN HUMAN 5 % IV SOLN
250.0000 mL | INTRAVENOUS | Status: AC | PRN
  Administered 2017-11-16 (×2): 12.5 g via INTRAVENOUS

## 2017-11-16 MED ORDER — LACTATED RINGERS IV SOLN
INTRAVENOUS | Status: DC | PRN
  Administered 2017-11-16: 09:00:00 via INTRAVENOUS

## 2017-11-16 MED ORDER — POTASSIUM CHLORIDE 10 MEQ/50ML IV SOLN
10.0000 meq | INTRAVENOUS | Status: AC
  Administered 2017-11-16 (×3): 10 meq via INTRAVENOUS

## 2017-11-16 MED ORDER — FAMOTIDINE IN NACL 20-0.9 MG/50ML-% IV SOLN: 20.0000 mg | Freq: Two times a day (BID) | INTRAVENOUS | Status: DC

## 2017-11-16 MED ORDER — HEMOSTATIC AGENTS (NO CHARGE) OPTIME
TOPICAL | Status: DC | PRN
  Administered 2017-11-16: 1 via TOPICAL

## 2017-11-16 MED ORDER — SUCCINYLCHOLINE CHLORIDE 200 MG/10ML IV SOSY
PREFILLED_SYRINGE | INTRAVENOUS | Status: DC | PRN
  Administered 2017-11-16: 200 mg via INTRAVENOUS

## 2017-11-16 MED ORDER — BISACODYL 10 MG RE SUPP: 10.0000 mg | Freq: Every day | RECTAL | Status: DC

## 2017-11-16 MED ORDER — EPHEDRINE 5 MG/ML INJ
INTRAVENOUS | Status: AC
  Filled 2017-11-16: qty 10

## 2017-11-16 MED ORDER — PROTAMINE SULFATE 10 MG/ML IV SOLN
INTRAVENOUS | Status: DC | PRN
  Administered 2017-11-16: 120 mg via INTRAVENOUS
  Administered 2017-11-16: 200 mg via INTRAVENOUS
  Administered 2017-11-16: 80 mg via INTRAVENOUS

## 2017-11-16 MED ORDER — METOPROLOL TARTRATE 5 MG/5ML IV SOLN
2.5000 mg | INTRAVENOUS | Status: DC | PRN
  Filled 2017-11-16: qty 5

## 2017-11-16 MED ORDER — MORPHINE SULFATE (PF) 2 MG/ML IV SOLN
2.0000 mg | INTRAVENOUS | Status: DC | PRN
  Administered 2017-11-16 – 2017-11-17 (×3): 2 mg via INTRAVENOUS
  Administered 2017-11-17 – 2017-11-18 (×3): 4 mg via INTRAVENOUS
  Filled 2017-11-16: qty 1
  Filled 2017-11-16 (×2): qty 2
  Filled 2017-11-16: qty 1
  Filled 2017-11-16: qty 2
  Filled 2017-11-16: qty 1

## 2017-11-16 MED ORDER — ORAL CARE MOUTH RINSE
15.0000 mL | Freq: Two times a day (BID) | OROMUCOSAL | Status: DC
  Administered 2017-11-16 – 2017-11-21 (×6): 15 mL via OROMUCOSAL

## 2017-11-16 MED ORDER — TRAMADOL HCL 50 MG PO TABS
50.0000 mg | ORAL_TABLET | ORAL | Status: DC | PRN
  Administered 2017-11-17: 50 mg via ORAL
  Administered 2017-11-19 – 2017-11-20 (×3): 100 mg via ORAL
  Filled 2017-11-16: qty 1
  Filled 2017-11-16 (×3): qty 2
  Filled 2017-11-16: qty 1

## 2017-11-16 MED ORDER — METOPROLOL TARTRATE 12.5 MG HALF TABLET
12.5000 mg | ORAL_TABLET | Freq: Two times a day (BID) | ORAL | Status: DC
  Administered 2017-11-17 – 2017-11-18 (×3): 12.5 mg via ORAL
  Filled 2017-11-16 (×4): qty 1

## 2017-11-16 MED ORDER — ONDANSETRON HCL 4 MG/2ML IJ SOLN
INTRAMUSCULAR | Status: DC | PRN
  Administered 2017-11-16: 4 mg via INTRAVENOUS

## 2017-11-16 MED ORDER — LACTATED RINGERS IV SOLN
INTRAVENOUS | Status: DC
  Administered 2017-11-16: 21:00:00 via INTRAVENOUS

## 2017-11-16 SURGICAL SUPPLY — 114 items
APPLIER CLIP 9.375 SM OPEN (CLIP)
APR CLP SM 9.3 20 MLT OPN (CLIP)
BAG DECANTER FOR FLEXI CONT (MISCELLANEOUS) ×5 IMPLANT
BANDAGE ELASTIC 4 VELCRO ST LF (GAUZE/BANDAGES/DRESSINGS) ×2 IMPLANT
BASKET HEART  (ORDER IN 25'S) (MISCELLANEOUS) ×1
BASKET HEART (ORDER IN 25'S) (MISCELLANEOUS) ×1
BASKET HEART (ORDER IN 25S) (MISCELLANEOUS) ×3 IMPLANT
BLADE CLIPPER SURG (BLADE) ×4 IMPLANT
BLADE STERNUM SYSTEM 6 (BLADE) ×5 IMPLANT
BLADE SURG 15 STRL LF DISP TIS (BLADE) IMPLANT
BLADE SURG 15 STRL SS (BLADE) ×5
BNDG GAUZE ELAST 4 BULKY (GAUZE/BANDAGES/DRESSINGS) ×5 IMPLANT
CANISTER SUCT 3000ML PPV (MISCELLANEOUS) ×5 IMPLANT
CANNULA EZ GLIDE AORTIC 21FR (CANNULA) ×5 IMPLANT
CATH COUDE FOLEY 5CC 14FR (CATHETERS) ×2 IMPLANT
CATH CPB KIT HENDRICKSON (MISCELLANEOUS) ×5 IMPLANT
CATH FOLEY 2WAY SLVR  5CC 12FR (CATHETERS) ×2
CATH FOLEY 2WAY SLVR 5CC 12FR (CATHETERS) IMPLANT
CATH ROBINSON RED A/P 18FR (CATHETERS) ×5 IMPLANT
CATH THORACIC 36FR (CATHETERS) ×5 IMPLANT
CATH THORACIC 36FR RT ANG (CATHETERS) ×5 IMPLANT
CLIP APPLIE 9.375 SM OPEN (CLIP) IMPLANT
CLIP VESOCCLUDE MED 24/CT (CLIP) IMPLANT
CLIP VESOCCLUDE SM WIDE 24/CT (CLIP) ×14 IMPLANT
COVER MAYO STAND STRL (DRAPES) ×4 IMPLANT
COVER WAND RF STERILE (DRAPES) ×3 IMPLANT
CRADLE DONUT ADULT HEAD (MISCELLANEOUS) ×5 IMPLANT
DRAPE CARDIOVASCULAR INCISE (DRAPES) ×5
DRAPE HALF SHEET 40X57 (DRAPES) ×4 IMPLANT
DRAPE SLUSH/WARMER DISC (DRAPES) ×5 IMPLANT
DRAPE SRG 135X102X78XABS (DRAPES) ×3 IMPLANT
DRSG AQUACEL AG ADV 3.5X14 (GAUZE/BANDAGES/DRESSINGS) ×2 IMPLANT
DRSG COVADERM 4X14 (GAUZE/BANDAGES/DRESSINGS) ×3 IMPLANT
ELECT REM PT RETURN 9FT ADLT (ELECTROSURGICAL) ×10
ELECTRODE REM PT RTRN 9FT ADLT (ELECTROSURGICAL) ×6 IMPLANT
FELT TEFLON 1X6 (MISCELLANEOUS) ×8 IMPLANT
GAUZE SPONGE 4X4 12PLY STRL LF (GAUZE/BANDAGES/DRESSINGS) ×4 IMPLANT
GEL ULTRASOUND 20GR AQUASONIC (MISCELLANEOUS) ×2 IMPLANT
GLOVE BIO SURGEON STRL SZ 6 (GLOVE) ×12 IMPLANT
GLOVE BIO SURGEON STRL SZ 6.5 (GLOVE) ×3 IMPLANT
GLOVE BIO SURGEONS STRL SZ 6.5 (GLOVE) ×3
GLOVE BIOGEL PI IND STRL 6 (GLOVE) IMPLANT
GLOVE BIOGEL PI IND STRL 6.5 (GLOVE) IMPLANT
GLOVE BIOGEL PI INDICATOR 6 (GLOVE) ×6
GLOVE BIOGEL PI INDICATOR 6.5 (GLOVE) ×4
GLOVE SURG SIGNA 7.5 PF LTX (GLOVE) ×19 IMPLANT
GOWN STRL REUS W/ TWL LRG LVL3 (GOWN DISPOSABLE) ×12 IMPLANT
GOWN STRL REUS W/ TWL XL LVL3 (GOWN DISPOSABLE) ×6 IMPLANT
GOWN STRL REUS W/TWL LRG LVL3 (GOWN DISPOSABLE) ×45
GOWN STRL REUS W/TWL XL LVL3 (GOWN DISPOSABLE) ×15
HARMONIC SHEARS 14CM COAG (MISCELLANEOUS) ×3 IMPLANT
HEMOSTAT ARISTA ABSORB 3G PWDR (MISCELLANEOUS) ×6 IMPLANT
HEMOSTAT SURGICEL 2X14 (HEMOSTASIS) ×5 IMPLANT
INSERT FOGARTY XLG (MISCELLANEOUS) IMPLANT
IV CATH 22GX1 FEP (IV SOLUTION) ×4 IMPLANT
KIT BASIN OR (CUSTOM PROCEDURE TRAY) ×5 IMPLANT
KIT SUCTION CATH 14FR (SUCTIONS) ×10 IMPLANT
KIT TURNOVER KIT B (KITS) ×5 IMPLANT
KIT VASOVIEW HEMOPRO VH 3000 (KITS) ×3 IMPLANT
MARKER GRAFT CORONARY BYPASS (MISCELLANEOUS) ×15 IMPLANT
MARKER SKIN DUAL TIP RULER LAB (MISCELLANEOUS) ×2 IMPLANT
NS IRRIG 1000ML POUR BTL (IV SOLUTION) ×27 IMPLANT
PACK E OPEN HEART (SUTURE) ×5 IMPLANT
PACK OPEN HEART (CUSTOM PROCEDURE TRAY) ×5 IMPLANT
PAD ARMBOARD 7.5X6 YLW CONV (MISCELLANEOUS) ×10 IMPLANT
PAD ELECT DEFIB RADIOL ZOLL (MISCELLANEOUS) ×5 IMPLANT
PENCIL BUTTON HOLSTER BLD 10FT (ELECTRODE) ×7 IMPLANT
PUNCH AORTIC ROT 4.0MM RCL 40 (MISCELLANEOUS) ×2 IMPLANT
PUNCH AORTIC ROTATE 4.0MM (MISCELLANEOUS) IMPLANT
PUNCH AORTIC ROTATE 4.5MM 8IN (MISCELLANEOUS) IMPLANT
PUNCH AORTIC ROTATE 5MM 8IN (MISCELLANEOUS) IMPLANT
SET CARDIOPLEGIA MPS 5001102 (MISCELLANEOUS) ×2 IMPLANT
SHEARS HARMONIC 9CM CVD (BLADE) ×5 IMPLANT
SPONGE LAP 18X18 RF (DISPOSABLE) ×4 IMPLANT
SPONGE LAP 18X18 X RAY DECT (DISPOSABLE) ×6 IMPLANT
SPONGE LAP 4X18 RFD (DISPOSABLE) ×2 IMPLANT
SUT BONE WAX W31G (SUTURE) ×5 IMPLANT
SUT MNCRL AB 4-0 PS2 18 (SUTURE) IMPLANT
SUT PROLENE 3 0 SH DA (SUTURE) ×5 IMPLANT
SUT PROLENE 4 0 RB 1 (SUTURE) ×15
SUT PROLENE 4 0 SH DA (SUTURE) IMPLANT
SUT PROLENE 4-0 RB1 .5 CRCL 36 (SUTURE) IMPLANT
SUT PROLENE 6 0 C 1 30 (SUTURE) ×10 IMPLANT
SUT PROLENE 7 0 BV 1 (SUTURE) ×12 IMPLANT
SUT PROLENE 7 0 BV1 MDA (SUTURE) ×5 IMPLANT
SUT PROLENE 8 0 BV175 6 (SUTURE) ×12 IMPLANT
SUT SILK  1 MH (SUTURE) ×4
SUT SILK 1 MH (SUTURE) IMPLANT
SUT STEEL 6MS V (SUTURE) ×5 IMPLANT
SUT STEEL STERNAL CCS#1 18IN (SUTURE) IMPLANT
SUT STEEL SZ 6 DBL 3X14 BALL (SUTURE) ×5 IMPLANT
SUT VIC AB 1 CTX 36 (SUTURE) ×10
SUT VIC AB 1 CTX36XBRD ANBCTR (SUTURE) ×6 IMPLANT
SUT VIC AB 2-0 CT1 27 (SUTURE)
SUT VIC AB 2-0 CT1 TAPERPNT 27 (SUTURE) IMPLANT
SUT VIC AB 2-0 CTX 27 (SUTURE) IMPLANT
SUT VIC AB 3-0 SH 27 (SUTURE) ×5
SUT VIC AB 3-0 SH 27X BRD (SUTURE) IMPLANT
SUT VIC AB 3-0 X1 27 (SUTURE) IMPLANT
SUT VICRYL 4-0 PS2 18IN ABS (SUTURE) IMPLANT
SYR 50ML SLIP (SYRINGE) IMPLANT
SYSTEM SAHARA CHEST DRAIN ATS (WOUND CARE) ×5 IMPLANT
TAPE CLOTH SURG 4X10 WHT LF (GAUZE/BANDAGES/DRESSINGS) ×2 IMPLANT
TAPE PAPER 2X10 WHT MICROPORE (GAUZE/BANDAGES/DRESSINGS) ×2 IMPLANT
TOWEL GREEN STERILE (TOWEL DISPOSABLE) ×5 IMPLANT
TOWEL GREEN STERILE FF (TOWEL DISPOSABLE) ×7 IMPLANT
TRAY FOLEY SLVR 14FR TEMP STAT (SET/KITS/TRAYS/PACK) ×2 IMPLANT
TRAY FOLEY SLVR 16FR TEMP STAT (SET/KITS/TRAYS/PACK) ×5 IMPLANT
TUBE FEEDING 8FR 16IN STR KANG (MISCELLANEOUS) ×5 IMPLANT
TUBE SUCT INTRACARD DLP 20F (MISCELLANEOUS) ×2 IMPLANT
TUBING INSUFFLATION (TUBING) ×5 IMPLANT
UNDERPAD 30X30 (UNDERPADS AND DIAPERS) ×7 IMPLANT
WATER STERILE IRR 1000ML POUR (IV SOLUTION) ×10 IMPLANT
YANKAUER SUCT BULB TIP NO VENT (SUCTIONS) ×2 IMPLANT

## 2017-11-16 NOTE — Brief Op Note (Signed)
11/12/2017 - 11/16/2017  5:48 PM  PATIENT:  Andre Ford  37 y.o. male  PRE-OPERATIVE DIAGNOSIS:  3 VESSEL CAD  POST-OPERATIVE DIAGNOSIS:  3 VESSEL CAD  PROCEDURE:  TRANSESOPHAGEAL ECHOCARDIOGRAM (TEE), MEDIAN STERNOTOMY CORONARY ARTERY BYPASS GRAFTING (CABG) x 3  LIMA to LAD,   FREE RIMA to OM,   LEFT RADIAL ARTERY to PDA CYSTOSCOPY WITH URETHRAL DILATATION  - performed by Dr. Ronne Binning  SURGEON:  Surgeon(s) and Role:    Loreli Slot, MD - Primary  PHYSICIAN ASSISTANT: Doree Fudge PA-C  ASSISTANTS: Virgilio Frees RNFA   ANESTHESIA:   general  EBL:  750 mL   DRAINS: Chest tubes placed in the mediastinal and pleural spaces   COUNTS CORRECT:  YES  DICTATION: .Dragon Dictation  PLAN OF CARE: Admit to inpatient   PATIENT DISPOSITION:  ICU - intubated and hemodynamically stable.   Delay start of Pharmacological VTE agent (>24hrs) due to surgical blood loss or risk of bleeding: yes   BASELINE WEIGHT: 112.1 kg

## 2017-11-16 NOTE — Anesthesia Preprocedure Evaluation (Addendum)
Anesthesia Evaluation  Patient identified by MRN, date of birth, ID band Patient awake    Reviewed: Allergy & Precautions, NPO status , Patient's Chart, lab work & pertinent test results  Airway Mallampati: II  TM Distance: >3 FB Neck ROM: Full    Dental  (+) Dental Advisory Given, Chipped,    Pulmonary former smoker,    breath sounds clear to auscultation       Cardiovascular hypertension,  Rhythm:Regular Rate:Normal     Neuro/Psych    GI/Hepatic   Endo/Other  diabetes  Renal/GU      Musculoskeletal   Abdominal   Peds  Hematology   Anesthesia Other Findings   Reproductive/Obstetrics                            Anesthesia Physical Anesthesia Plan  ASA: IV  Anesthesia Plan: General   Post-op Pain Management:    Induction: Intravenous  PONV Risk Score and Plan: Ondansetron  Airway Management Planned: Oral ETT  Additional Equipment: PA Cath, 3D TEE and Ultrasound Guidance Line Placement  Intra-op Plan:   Post-operative Plan: Post-operative intubation/ventilation  Informed Consent: I have reviewed the patients History and Physical, chart, labs and discussed the procedure including the risks, benefits and alternatives for the proposed anesthesia with the patient or authorized representative who has indicated his/her understanding and acceptance.   Dental advisory given  Plan Discussed with: CRNA and Anesthesiologist  Anesthesia Plan Comments:         Anesthesia Quick Evaluation

## 2017-11-16 NOTE — Progress Notes (Addendum)
  Echocardiogram Echocardiogram Transesophageal has been performed.  Andre Ford 11/16/2017, 9:45 AM

## 2017-11-16 NOTE — Anesthesia Procedure Notes (Signed)
Central Venous Catheter Insertion Performed by: Kipp Brood, MD, anesthesiologist Start/End10/18/2019 8:40 AM, 11/16/2017 8:50 AM Patient location: Pre-op. Preanesthetic checklist: patient identified, IV checked, site marked, risks and benefits discussed, surgical consent, monitors and equipment checked, pre-op evaluation, timeout performed and anesthesia consent Hand hygiene performed  and maximum sterile barriers used  PA cath was placed.Swan type:thermodilution Procedure performed without using ultrasound guided technique. Attempts: 1 Following insertion, line sutured, dressing applied and Biopatch. Post procedure assessment: blood return through all ports, free fluid flow and no air  Patient tolerated the procedure well with no immediate complications.

## 2017-11-16 NOTE — Anesthesia Procedure Notes (Signed)
Central Venous Catheter Insertion Performed by: Kipp Brood, MD, anesthesiologist Start/End10/18/2019 8:40 AM, 11/16/2017 8:50 AM Patient location: Pre-op. Preanesthetic checklist: patient identified, IV checked, site marked, risks and benefits discussed, surgical consent, monitors and equipment checked, pre-op evaluation, timeout performed and anesthesia consent Lidocaine 1% used for infiltration and patient sedated Hand hygiene performed  and maximum sterile barriers used  Catheter size: 8 Fr Total catheter length 16. Central line was placed.Double lumen Procedure performed using ultrasound guided technique. Ultrasound Notes:image(s) printed for medical record Attempts: 1 Following insertion, dressing applied and line sutured. Post procedure assessment: blood return through all ports  Patient tolerated the procedure well with no immediate complications.

## 2017-11-16 NOTE — Anesthesia Procedure Notes (Addendum)
Procedure Name: Intubation Date/Time: 11/16/2017 9:27 AM Performed by: Julian Reil, CRNA Pre-anesthesia Checklist: Patient identified, Emergency Drugs available, Suction available and Patient being monitored Patient Re-evaluated:Patient Re-evaluated prior to induction Oxygen Delivery Method: Circle system utilized Preoxygenation: Pre-oxygenation with 100% oxygen Induction Type: IV induction Laryngoscope Size: Glidescope and 4 Grade View: Grade I Tube type: Subglottic suction tube Tube size: 8.0 mm Number of attempts: 1 Airway Equipment and Method: Stylet and Video-laryngoscopy Placement Confirmation: ETT inserted through vocal cords under direct vision,  positive ETCO2 and breath sounds checked- equal and bilateral Secured at: 24 cm Tube secured with: Tape Dental Injury: Teeth and Oropharynx as per pre-operative assessment  Comments: Glidescope electively used due to full beard, short thick neck and small mouth opening.  Noted chipped upper front left tooth in Short-Stay.  Soft gauze placed in mouth per Dr Shela Commons after TEE.

## 2017-11-16 NOTE — Procedures (Signed)
Extubation Procedure Note  Patient Details:   Name: Andre Ford DOB: 06-Oct-1980 MRN: [id number]   Airway Documentation:  Airway 8 mm (Active)  Secured at (cm) 24 cm 11/16/2017  6:32 PM  Measured From Lips 11/16/2017  6:32 PM  Secured Location Right 11/16/2017  6:32 PM  Secured By Pink Tape 11/16/2017  6:32 PM  Site Condition Dry 11/16/2017  6:32 PM   Vent end date: 11/16/17 Vent end time: 1930   Evaluation  O2 sats: stable throughout Complications: No apparent complications Patient did tolerate procedure well. Bilateral Breath Sounds: Diminished, Rhonchi   Yes  Augustina Mood 11/16/2017, 7:39 PM   Pt extubated per rapid wean protocol. Pt able to perform VC of 900 mL and NIF of -25 Pt had positive cuff leak and was able to perform IS of 1125. Pt placed on 4 L [place] at this time and is during well. Will continue to monitor as needed.

## 2017-11-16 NOTE — Progress Notes (Signed)
TCTS BRIEF SICU PROGRESS NOTE  Day of Surgery  S/P Procedure(s) (LRB): CORONARY ARTERY BYPASS GRAFTING (CABG) x 3. (N/A) RADIAL ARTERY HARVEST (Left) TRANSESOPHAGEAL ECHOCARDIOGRAM (TEE) (N/A) CYSTOSCOPY WITH URETHRAL DILATATION (N/A)   Sedated on vent NSR w/ stable hemodynamics on low dose NTG O2 sats 100% Chest tube output low UOP excellent Labs okay  Plan: Continue routine early postop  Purcell Nails, MD 11/16/2017 6:01 PM

## 2017-11-16 NOTE — OR Nursing (Signed)
1035 Dr. Ronne Binning (Urologist) arrived to perform urethral dilatation and place #16FR/10 ML urine temp foley. Unsuccessful attempts made per Randell Loop, RNFA and Dr. Dorris Fetch prior to consulting Dr. Ronne Binning. Pink tinged urine obtained after Dr. Ronne Binning placed foley cath.

## 2017-11-16 NOTE — Transfer of Care (Signed)
Immediate Anesthesia Transfer of Care Note  Patient: Andre Ford  Procedure(s) Performed: CORONARY ARTERY BYPASS GRAFTING (CABG) x 3. (N/A Chest) RADIAL ARTERY HARVEST (Left ) TRANSESOPHAGEAL ECHOCARDIOGRAM (TEE) (N/A ) CYSTOSCOPY WITH URETHRAL DILATATION (N/A )  Patient Location: ICU  Anesthesia Type:General  Level of Consciousness: Patient remains intubated per anesthesia plan  Airway & Oxygen Therapy: Patient remains intubated per anesthesia plan and Patient placed on Ventilator (see vital sign flow sheet for setting)  Post-op Assessment: Report given to RN and Post -op Vital signs reviewed and stable  Post vital signs: Reviewed and stable  Last Vitals:  Vitals Value Taken Time  BP    Temp    Pulse    Resp    SpO2      Last Pain:  Vitals:   11/16/17 0609  TempSrc: Oral  PainSc:       Patients Stated Pain Goal: 0 (11/15/17 2315)  Complications: No apparent anesthesia complications

## 2017-11-16 NOTE — Interval H&P Note (Signed)
History and Physical Interval Note:  11/16/2017 8:03 AM  Andre Ford  has presented today for surgery, with the diagnosis of CAD  The various methods of treatment have been discussed with the patient and family. After consideration of risks, benefits and other options for treatment, the patient has consented to  Procedure(s) with comments: CORONARY ARTERY BYPASS GRAFTING (CABG) (N/A) - POSS BILATERAL IMAS RADIAL ARTERY HARVEST (Left) TRANSESOPHAGEAL ECHOCARDIOGRAM (TEE) (N/A) as a surgical intervention .  The patient's history has been reviewed, patient examined, no change in status, stable for surgery.  I have reviewed the patient's chart and labs.  Questions were answered to the patient's satisfaction.     Loreli Slot

## 2017-11-16 NOTE — Anesthesia Procedure Notes (Signed)
Central Venous Catheter Insertion Performed by: Kipp Brood, MD, anesthesiologist Start/End10/18/2019 8:40 AM, 11/16/2017 8:50 AM Patient location: Pre-op. Preanesthetic checklist: patient identified, IV checked, site marked, risks and benefits discussed, surgical consent, monitors and equipment checked, pre-op evaluation, timeout performed and anesthesia consent Lidocaine 1% used for infiltration and patient sedated Hand hygiene performed  and maximum sterile barriers used  Catheter size: 8.5 Fr Sheath introducer Procedure performed using ultrasound guided technique. Ultrasound Notes:anatomy identified, needle tip was noted to be adjacent to the nerve/plexus identified, no ultrasound evidence of intravascular and/or intraneural injection and image(s) printed for medical record Attempts: 1 Following insertion, line sutured and dressing applied. Post procedure assessment: blood return through all ports, free fluid flow and no air  Patient tolerated the procedure well with no immediate complications.

## 2017-11-16 NOTE — Op Note (Signed)
Preoperative diagnosis: urethral stricture  Postoperative diagnosis: Same  Procedure: 1. Urethral dilation from 8 french to 18 french  Attending: Wilkie Aye  Anesthesia: General  History of blood loss: Minimal  Antibiotics: cefuroxime  Specimens: none  Drains: 16 french foley catheter  Findings: dense distal penile urethral stricture at site of previous buccal mucosa graft.   Indications: Patient is a 37 year old male with a history of urethral stricture disease who under buccal mucosa graft. He is schedule to undergo coronary artery bypass grafting and nursing was unable to place a foley prior to the surgery.   Procedure in detail:   His genitalia was then prepped and draped in usual sterile fashion. Using the Hagar dilators the urethra was dilated to 18 french. We then placed an 16 French foley catheter.  This then concluded the procedure which was well tolerated by the patient.  Complications: None  Condition: Stable.  Plan: The foley catheter is to remain in place and he should followup with [facility] in 1 week for a voiding trial

## 2017-11-16 NOTE — Op Note (Signed)
NAME: MORIS, RATCHFORD MEDICAL RECORD ZO:[id number] ACCOUNT [id number] DATE OF BIRTH:1980-12-21 FACILITY: [facility] LOCATION: [facility] PHYSICIAN:Loki Wuthrich Lars Pinks, MD  OPERATIVE REPORT  DATE OF PROCEDURE:  11/16/2017  PREOPERATIVE DIAGNOSIS:  Severe 3-vessel coronary artery disease.  POSTOPERATIVE DIAGNOSES:  Severe 3-vessel coronary disease, urethral stricture.  PROCEDURE:  Median sternotomy, extracorporeal circulation, Coronary artery bypass grafting x 3  Left internal mammary artery to left anterior descending,  Free right internal mammary artery to the obtuse marginal,  left radial artery to posterior descending.  SURGEON:  Charlett Lango, MD  ASSISTANT:  Doree Fudge, PA-C  ANESTHESIA:  General.  FINDINGS:  Good quality conduits.  Mammary harvest difficult due to patient's body habitus.  Good quality target vessels.  Transesophageal echocardiography revealed preserved left ventricular function.  CLINICAL NOTE:  The patient is a 37 year old gentleman who presented with history of pain in his neck, shoulders and arms.  This has been getting worse recently, and he also has begun to experience diaphoresis.  A stress test showed inferior and  inferolateral ischemic changes.  At catheterization, he was found to have severe 3-vessel coronary disease.  He was referred for coronary artery bypass grafting.  The indications, risks, benefits, and alternatives were discussed in detail with the  patient.  He understood and accepted the risks and agreed to proceed.  OPERATIVE NOTE:  The patient was brought to the preoperative holding area on 11/06/2017.  There, anesthesia placed a Swan-Ganz catheter and arterial blood pressure monitor line.  He was taken to the operating room, anesthetized and intubated.   Intravenous antibiotics were administered.  An attempt was made to place a Foley catheter.  The patient had a urethral stricture.  Dr. Ronne Binning from urology was consulted and was  able to place a Foley catheter.  Transesophageal echocardiography was  performed by Dr. Kipp Brood. It revealed preserved left ventricular function with no significant valvular pathology.  After the Foley catheter was in place, the patient's chest, abdomen and legs were prepped and draped in the usual sterile fashion as was the left arm.  A timeout was performed.  An incision was made over the volar aspect of the left wrist.  A short  segment of the radial artery was dissected out.  There was a good palmar arch Doppler and a good pulse distally with proximal occlusion.  The incision was extended to just below the antecubital fossa, and the left radial artery was harvested using the  Harmonic scalpel.  It was a good quality vessel.  Simultaneously with this, a median sternotomy was performed and the left internal mammary artery was harvested using standard technique.  Again, this was difficult due to the patient's body habitus, but  the mammary artery had excellent flow when divided distally and was a good quality vessel.  Two thousand units of heparin was administered during the vessel harvest.  After the radial artery was harvested, the left arm incision was closed in standard  fashion.  The hand and arm were wrapped and tucked back to the patient's side.  The right internal mammary artery then was harvested using standard technique.  This likewise was a good quality vessel.  Once harvested, the remainder of the full heparin  dose was given at the completion of the right mammary taken down.  A sternal retractor was placed.  The pericardium was opened.  The ascending aorta was inspected.  It was of normal caliber with no atherosclerotic disease.  After confirming adequate anticoagulation with ACT measurement, the aorta was  cannulated via  concentric 2-0 Ethibond pledgeted pursestring sutures.  A dual-stage venous cannula was placed via pursestring suture in the right atrial appendage.  Cardiopulmonary  bypass was initiated.  Flows were maintained per protocol.  The coronary arteries were  inspected and anastomotic sites were chosen.  The conduits were inspected and prepared.  The right mammary would not reach the posterior descending or the first obtuse marginal as a pedicle graft.  Therefore, it was divided proximally and used as a free  graft for the OM.  A foam pad was placed in the pericardium to insulate the heart.  A temperature probe was placed in the myocardial septum, and a cardioplegia cannula was placed in the ascending aorta.  The aorta was crossclamped.  The left ventricle was emptied via the aortic root vent.  Cardiac arrest was achieved with a combination of cold antegrade blood cardioplegia and topical iced saline.  With antegrade cardioplegia, there was a rapid diastolic  arrest. Septal cooling to 10 degrees Celsius was obtained with 1.5 L of cardioplegia.  Additional cardioplegia was administered at the completion of each distal anastomosis.  The distal end of the left radial artery was bevelled.  It was anastomosed end-to-side to the posterior descending.  The posterior descending was a 2 mm good quality target.  The radial was a good quality conduit.  The end-to-side anastomosis was  performed with a running 8-0 Prolene suture.  With cardioplegia administration, there was good backbleeding from the radial artery.  The distal end of the right mammary artery was bevelled.  It was anastomosed end-to-side to obtuse marginal 1.  This was the largest lateral branch.  It was a 1.5 mm good quality vessel.  The right mammary was anastomosed end-to-side with a running 8-0  Prolene suture.  Again, with cardioplegia administration, there was good backbleeding from the graft.  The left internal mammary artery was brought through a window in the pericardium.  The distal limb was bevelled.  It was anastomosed end-to-side to the distal LAD, but the LAD and mammary were 2 mm good quality vessels.   The end-to-side anastomosis was  performed with a running 8-0 Prolene suture.  After completion of the anastomosis, the bulldog clamp was briefly removed to inspect for hemostasis.  Rapid septal rewarming was noted.  The bulldog clamp was replaced, and additional cardioplegia was  administered.  The right mammary and radial artery were cut to length.  The cardioplegia cannula was removed from the ascending aorta.  The proximal anastomoses were performed to 4.0 mm punch aortotomies with running 7-0 Prolene sutures.  After  completion of the second proximal anastomosis, the patient was placed in Trendelenburg position.  Lidocaine was administered.  The aortic root was de-aired.  The bulldog clamp was removed from the left mammary artery.  The aortic crossclamp was removed.   The total crossclamp time was 69 minutes.  The patient required a single defibrillation with 10 joules and then was in sinus rhythm thereafter.  While rewarming was completed, all proximal and distal anastomoses were inspected for hemostasis.  Epicardial pacing wires were placed on the right ventricle and right atrium.  When the patient had rewarmed to a core temperature of 37 degrees Celsius, he  was weaned from cardiopulmonary bypass on the first attempt.  He was in sinus rhythm and on no inotropic support at the time of separation from bypass.  Total bypass time was 119 minutes.  The initial cardiac index was greater than 2 L/min per  sq m.   The patient remained hemodynamically stable throughout the post-bypass period.  Post-bypass transesophageal echocardiography showed preserved left ventricular function.  A test dose of protamine was administered and was well tolerated.  The atrial and aortic cannulae were removed.  The remainder of the protamine was administered without incident.  The chest was irrigated with warm saline.  Hemostasis was achieved.  The  pericardium was reapproximated over the ascending aorta and base of the heart  with interrupted 3-0 silk sutures.  Bilateral pleural tubes and a single mediastinal chest tube were placed in separate subcostal incisions.  The sternum was closed with a  combination of single and double heavy gauge stainless steel wires.  Pectoralis fascia, subcutaneous tissue and skin were closed in standard fashion.  All sponge, needle and instrument counts were correct at the end of the procedure.  The patient was  taken from the operating room to the surgical intensive care unit, intubated and in good condition.  LN/NUANCE  D:11/16/2017 T:11/16/2017 JOB:[id number]

## 2017-11-16 NOTE — Anesthesia Procedure Notes (Signed)
Arterial Line Insertion Start/End10/18/2019 8:32 AM, 11/16/2017 8:35 AM Performed by: Julian Reil, CRNA, CRNA  Patient location: Pre-op. Preanesthetic checklist: patient identified, IV checked, site marked, risks and benefits discussed, surgical consent, monitors and equipment checked and pre-op evaluation Lidocaine 1% used for infiltration and patient sedated Right, radial was placed Catheter size: 20 G Hand hygiene performed  and maximum sterile barriers used   Attempts: 1 Procedure performed without using ultrasound guided technique. Following insertion, Biopatch and dressing applied. Post procedure assessment: normal  Patient tolerated the procedure well with no immediate complications.

## 2017-11-17 ENCOUNTER — Inpatient Hospital Stay (HOSPITAL_COMMUNITY): Payer: BLUE CROSS/BLUE SHIELD

## 2017-11-17 LAB — BASIC METABOLIC PANEL
ANION GAP: 6 (ref 5–15)
BUN: 7 mg/dL (ref 6–20)
CALCIUM: 8.5 mg/dL — AB (ref 8.9–10.3)
CHLORIDE: 105 mmol/L (ref 98–111)
CO2: 24 mmol/L (ref 22–32)
Creatinine, Ser: 0.71 mg/dL (ref 0.61–1.24)
GFR calc Af Amer: >60 mL/min (ref >60)
GFR calc non Af Amer: >60 mL/min (ref >60)
GLUCOSE: 111 mg/dL — AB (ref 70–99)
Potassium: 4.4 mmol/L (ref 3.5–5.1)
Sodium: 135 mmol/L (ref 135–145)

## 2017-11-17 LAB — GLUCOSE, CAPILLARY
GLUCOSE-CAPILLARY: 100 mg/dL — AB (ref 70–99)
GLUCOSE-CAPILLARY: 111 mg/dL — AB (ref 70–99)
GLUCOSE-CAPILLARY: 127 mg/dL — AB (ref 70–99)
GLUCOSE-CAPILLARY: 135 mg/dL — AB (ref 70–99)
GLUCOSE-CAPILLARY: 136 mg/dL — AB (ref 70–99)
GLUCOSE-CAPILLARY: 150 mg/dL — AB (ref 70–99)
GLUCOSE-CAPILLARY: 95 mg/dL (ref 70–99)
Glucose-Capillary: 133 mg/dL — ABNORMAL HIGH (ref 70–99)
Glucose-Capillary: 123 mg/dL — ABNORMAL HIGH (ref 70–99)
Glucose-Capillary: 143 mg/dL — ABNORMAL HIGH (ref 70–99)
Glucose-Capillary: 131 mg/dL — ABNORMAL HIGH (ref 70–99)
Glucose-Capillary: 108 mg/dL — ABNORMAL HIGH (ref 70–99)
Glucose-Capillary: 102 mg/dL — ABNORMAL HIGH (ref 70–99)
Glucose-Capillary: 106 mg/dL — ABNORMAL HIGH (ref 70–99)
Glucose-Capillary: 105 mg/dL — ABNORMAL HIGH (ref 70–99)
Glucose-Capillary: 84 mg/dL (ref 70–99)
Glucose-Capillary: 88 mg/dL (ref 70–99)

## 2017-11-17 LAB — CREATININE, SERUM
Creatinine, Ser: 0.64 mg/dL (ref 0.61–1.24)
Creatinine, Ser: 0.86 mg/dL (ref 0.61–1.24)
GFR calc Af Amer: >60 mL/min (ref >60)
GFR calc non Af Amer: >60 mL/min (ref >60)

## 2017-11-17 LAB — POCT I-STAT, CHEM 8
BUN: 12 mg/dL (ref 6–20)
CALCIUM ION: 1.24 mmol/L (ref 1.15–1.40)
Chloride: 99 mmol/L (ref 98–111)
Creatinine, Ser: 0.8 mg/dL (ref 0.61–1.24)
GLUCOSE: 154 mg/dL — AB (ref 70–99)
HCT: 34 % — ABNORMAL LOW (ref 39.0–52.0)
HEMOGLOBIN: 11.6 g/dL — AB (ref 13.0–17.0)
Potassium: 4.3 mmol/L (ref 3.5–5.1)
Sodium: 136 mmol/L (ref 135–145)
TCO2: 26 mmol/L (ref 22–32)

## 2017-11-17 LAB — MAGNESIUM
Magnesium: 2.7 mg/dL — ABNORMAL HIGH (ref 1.7–2.4)
Magnesium: 2.3 mg/dL (ref 1.7–2.4)
Magnesium: 1.9 mg/dL (ref 1.7–2.4)

## 2017-11-17 LAB — CBC
HCT: 37 % — ABNORMAL LOW (ref 39.0–52.0)
HEMATOCRIT: 36.3 % — AB (ref 39.0–52.0)
HEMATOCRIT: 35.5 % — AB (ref 39.0–52.0)
HEMOGLOBIN: 12.4 g/dL — AB (ref 13.0–17.0)
HEMOGLOBIN: 11.9 g/dL — AB (ref 13.0–17.0)
HEMOGLOBIN: 11.5 g/dL — AB (ref 13.0–17.0)
MCH: 29 pg (ref 26.0–34.0)
MCH: 28.6 pg (ref 26.0–34.0)
MCH: 28.6 pg (ref 26.0–34.0)
MCHC: 33.5 g/dL (ref 30.0–36.0)
MCHC: 32.8 g/dL (ref 30.0–36.0)
MCHC: 32.4 g/dL (ref 30.0–36.0)
MCV: 86.7 fL (ref 80.0–100.0)
MCV: 87.3 fL (ref 80.0–100.0)
MCV: 88.3 fL (ref 80.0–100.0)
NRBC: 0 % (ref 0.0–0.2)
NRBC: 0 % (ref 0.0–0.2)
PLATELETS: 126 10*3/uL — AB (ref 150–400)
Platelets: 126 10*3/uL — ABNORMAL LOW (ref 150–400)
Platelets: 109 10*3/uL — ABNORMAL LOW (ref 150–400)
RBC: 4.27 MIL/uL (ref 4.22–5.81)
RBC: 4.16 MIL/uL — ABNORMAL LOW (ref 4.22–5.81)
RBC: 4.02 MIL/uL — ABNORMAL LOW (ref 4.22–5.81)
RDW: 13.7 % (ref 11.5–15.5)
RDW: 13.8 % (ref 11.5–15.5)
RDW: 14.3 % (ref 11.5–15.5)
WBC: 11 10*3/uL — AB (ref 4.0–10.5)
WBC: 9.4 10*3/uL (ref 4.0–10.5)
WBC: 9.3 10*3/uL (ref 4.0–10.5)
nRBC: 0 % (ref 0.0–0.2)

## 2017-11-17 MED ORDER — ENOXAPARIN SODIUM 40 MG/0.4ML ~~LOC~~ SOLN
40.0000 mg | Freq: Every day | SUBCUTANEOUS | Status: DC
  Administered 2017-11-17 – 2017-11-20 (×4): 40 mg via SUBCUTANEOUS
  Filled 2017-11-17 (×4): qty 0.4

## 2017-11-17 MED ORDER — INSULIN DETEMIR 100 UNIT/ML ~~LOC~~ SOLN
20.0000 [IU] | Freq: Once | SUBCUTANEOUS | Status: AC
  Administered 2017-11-17: 20 [IU] via SUBCUTANEOUS
  Filled 2017-11-17: qty 0.2

## 2017-11-17 MED ORDER — TAMSULOSIN HCL 0.4 MG PO CAPS
0.4000 mg | ORAL_CAPSULE | Freq: Every day | ORAL | Status: DC
  Administered 2017-11-17 – 2017-11-20 (×4): 0.4 mg via ORAL
  Filled 2017-11-17 (×4): qty 1

## 2017-11-17 MED ORDER — INSULIN DETEMIR 100 UNIT/ML ~~LOC~~ SOLN
20.0000 [IU] | Freq: Every day | SUBCUTANEOUS | Status: DC
  Administered 2017-11-18: 20 [IU] via SUBCUTANEOUS
  Filled 2017-11-17 (×2): qty 0.2

## 2017-11-17 MED ORDER — INSULIN ASPART 100 UNIT/ML ~~LOC~~ SOLN
0.0000 [IU] | SUBCUTANEOUS | Status: DC
  Administered 2017-11-17 (×3): 2 [IU] via SUBCUTANEOUS
  Administered 2017-11-18: 4 [IU] via SUBCUTANEOUS
  Administered 2017-11-18 (×2): 2 [IU] via SUBCUTANEOUS
  Administered 2017-11-18: 8 [IU] via SUBCUTANEOUS
  Administered 2017-11-18: 4 [IU] via SUBCUTANEOUS
  Administered 2017-11-19 (×2): 2 [IU] via SUBCUTANEOUS

## 2017-11-17 MED ORDER — KETOROLAC TROMETHAMINE 15 MG/ML IJ SOLN
15.0000 mg | Freq: Four times a day (QID) | INTRAMUSCULAR | Status: AC
  Administered 2017-11-17 – 2017-11-18 (×5): 15 mg via INTRAVENOUS
  Filled 2017-11-17 (×5): qty 1

## 2017-11-17 NOTE — Progress Notes (Signed)
[facility] BRIEF SICU PROGRESS NOTE  1 Day Post-Op  S/P Procedure(s) (LRB): CORONARY ARTERY BYPASS GRAFTING (CABG) x 3. (N/A) RADIAL ARTERY HARVEST (Left) TRANSESOPHAGEAL ECHOCARDIOGRAM (TEE) (N/A) CYSTOSCOPY WITH URETHRAL DILATATION (N/A)   Stable day Ambulated remarkably well Mild soreness  NSR w/ stable BP Breathing comfortably on room air UOP adequate Labs okay  Plan: Continue routine care  Purcell Nails, MD 11/17/2017 7:59 PM

## 2017-11-17 NOTE — Plan of Care (Signed)
  Problem: Education: Goal: Knowledge of General Education information will improve Description Including pain rating scale, medication(s)/side effects and non-pharmacologic comfort measures Outcome: Progressing   Problem: Health Behavior/Discharge Planning: Goal: Ability to manage health-related needs will improve Outcome: Progressing   Problem: Clinical Measurements: Goal: Ability to maintain clinical measurements within normal limits will improve Outcome: Progressing Goal: Will remain free from infection Outcome: Progressing Goal: Diagnostic test results will improve Outcome: Progressing Goal: Respiratory complications will improve Outcome: Progressing   Problem: Activity: Goal: Risk for activity intolerance will decrease Outcome: Progressing   Problem: Nutrition: Goal: Adequate nutrition will be maintained Outcome: Progressing   Problem: Coping: Goal: Level of anxiety will decrease Outcome: Progressing   Problem: Elimination: Goal: Will not experience complications related to bowel motility Outcome: Progressing Goal: Will not experience complications related to urinary retention Outcome: Progressing   Problem: Pain Managment: Goal: General experience of comfort will improve Outcome: Progressing   Problem: Safety: Goal: Ability to remain free from injury will improve Outcome: Progressing   Problem: Skin Integrity: Goal: Risk for impaired skin integrity will decrease Outcome: Progressing   Problem: Education: Goal: Knowledge of disease or condition will improve Outcome: Progressing Goal: Knowledge of the prescribed therapeutic regimen will improve Outcome: Progressing   Problem: Activity: Goal: Risk for activity intolerance will decrease Outcome: Progressing   Problem: Cardiac: Goal: Will achieve and/or maintain hemodynamic stability Outcome: Progressing   Problem: Clinical Measurements: Goal: Postoperative complications will be avoided or  minimized Outcome: Progressing   Problem: Respiratory: Goal: Respiratory status will improve Outcome: Progressing   Problem: Skin Integrity: Goal: Wound healing without signs and symptoms of infection Outcome: Progressing Goal: Risk for impaired skin integrity will decrease Outcome: Progressing   Problem: Urinary Elimination: Goal: Ability to achieve and maintain adequate renal perfusion and functioning will improve Outcome: Progressing

## 2017-11-17 NOTE — Progress Notes (Addendum)
      [address]       [place] [address]             [phone]        CARDIOTHORACIC SURGERY PROGRESS NOTE   R1 Day Post-Op Procedure(s) (LRB): CORONARY ARTERY BYPASS GRAFTING (CABG) x 3. (N/A) RADIAL ARTERY HARVEST (Left) TRANSESOPHAGEAL ECHOCARDIOGRAM (TEE) (N/A) CYSTOSCOPY WITH URETHRAL DILATATION (N/A)  Subjective: Looks good.  Expected soreness in chest.  Has required a fair amount of narcotic pain relievers.  No nausea.  No SOB.  Stood at bedside w/out difficulty  Objective: Vital signs: BP Readings from Last 1 Encounters:  11/16/17 91/66   Pulse Readings from Last 1 Encounters:  11/17/17 96   Resp Readings from Last 1 Encounters:  11/17/17 17   Temp Readings from Last 1 Encounters:  11/17/17 99.3 F (37.4 C)    Hemodynamics: PAP: (20-36)/(11-25) 26/18 CO:  [4.8 L/min-5.8 L/min] 5.8 L/min CI:  [2.1 L/min/m2-2.6 L/min/m2] 2.6 L/min/m2  Physical Exam:  Rhythm:   sinus  Breath sounds: clear  Heart sounds:  RRR  Incisions:  Dressings dry, intact  Abdomen:  Soft, non-distended, non-tender  Extremities:  Warm, well-perfused, left hand neuro/vasc intact  Chest tubes:  low volume thin serosanguinous output, no air leak    Intake/Output from previous day: 10/18 0701 - 10/19 0700 In: 7031.3 [I.V.:4637.2; Blood:600; IV Piggyback:1794.1] Out: 3820 [Urine:2640; Blood:750; Chest Tube:430] Intake/Output this shift: No intake/output data recorded.  Lab Results:  CBC: Recent Labs    11/16/17 2323 11/16/17 2324 11/17/17 0423  WBC 11.0*  --  9.4  HGB 12.4* 11.6* 11.9*  HCT 37.0* 34.0* 36.3*  PLT 126*  --  126*    BMET:  Recent Labs    11/16/17 2324 11/17/17 0423  NA 137 135  K 4.4 4.4  CL 103 105  CO2  --  24  GLUCOSE 162* 111*  BUN 6 7  CREATININE 0.50* 0.71  CALCIUM  --  8.5*     PT/INR:   Recent Labs    11/16/17 1725  LABPROT 16.4*  INR 1.34    CBG (last 3)  Recent Labs    11/17/17 0417 11/17/17 0537  11/17/17 0638  GLUCAP 100* 102* 106*    ABG    Component Value Date/Time   PHART 7.344 (L) 11/16/2017 2043   PCO2ART 44.5 11/16/2017 2043   PO2ART 131.0 (H) 11/16/2017 2043   HCO3 24.2 11/16/2017 2043   TCO2 24 11/16/2017 2324   ACIDBASEDEF 2.0 11/16/2017 2043   O2SAT 99.0 11/16/2017 2043    CXR: Looks good.  Mild bibasilar atelectasis  EKG: NSR w/out acute ischemic changes    Assessment/Plan: S/P Procedure(s) (LRB): CORONARY ARTERY BYPASS GRAFTING (CABG) x 3. (N/A) RADIAL ARTERY HARVEST (Left) TRANSESOPHAGEAL ECHOCARDIOGRAM (TEE) (N/A) CYSTOSCOPY WITH URETHRAL DILATATION (N/A)  Doing well POD1 Maintaining NSR w/ stable hemodynamics on low dose NTG drip Breathing comfortably on 3 L/min via [place] Expected post op acute blood loss anemia, mild Expected post op atelectasis, mild Expected post op volume excess, weight reportedly 9 kg > preop, UOP adequate Type II diabetes mellitus, excellent glycemic control Urethral stricture w/ BOO   Mobilize  Imdur for RA graft and stop NTG  Diuresis  D/C lines D/C tubes later today or tomorrow, depending on output Leave Foley catheter in place   Purcell Nails, MD 11/17/2017 8:21 AM

## 2017-11-17 NOTE — Plan of Care (Signed)
  Problem: Education: Goal: Knowledge of General Education information will improve Description Including pain rating scale, medication(s)/side effects and non-pharmacologic comfort measures Outcome: Progressing   Problem: Health Behavior/Discharge Planning: Goal: Ability to manage health-related needs will improve Outcome: Progressing   Problem: Clinical Measurements: Goal: Ability to maintain clinical measurements within normal limits will improve Outcome: Progressing Goal: Will remain free from infection Outcome: Progressing Goal: Diagnostic test results will improve Outcome: Progressing Goal: Respiratory complications will improve Outcome: Progressing Goal: Cardiovascular complication will be avoided Outcome: Progressing   Problem: Activity: Goal: Risk for activity intolerance will decrease Outcome: Progressing   Problem: Nutrition: Goal: Adequate nutrition will be maintained Outcome: Progressing   Problem: Coping: Goal: Level of anxiety will decrease Outcome: Progressing   Problem: Elimination: Goal: Will not experience complications related to bowel motility Outcome: Progressing Goal: Will not experience complications related to urinary retention Outcome: Progressing   Problem: Pain Managment: Goal: General experience of comfort will improve Outcome: Progressing   Problem: Safety: Goal: Ability to remain free from injury will improve Outcome: Progressing   Problem: Skin Integrity: Goal: Risk for impaired skin integrity will decrease Outcome: Progressing   Problem: Education: Goal: Knowledge of disease or condition will improve Outcome: Progressing Goal: Knowledge of the prescribed therapeutic regimen will improve Outcome: Progressing   Problem: Activity: Goal: Risk for activity intolerance will decrease Outcome: Progressing   Problem: Cardiac: Goal: Will achieve and/or maintain hemodynamic stability Outcome: Progressing   Problem: Clinical  Measurements: Goal: Postoperative complications will be avoided or minimized Outcome: Progressing   Problem: Respiratory: Goal: Respiratory status will improve Outcome: Progressing   Problem: Skin Integrity: Goal: Wound healing without signs and symptoms of infection Outcome: Progressing Goal: Risk for impaired skin integrity will decrease Outcome: Progressing   Problem: Urinary Elimination: Goal: Ability to achieve and maintain adequate renal perfusion and functioning will improve Outcome: Progressing   

## 2017-11-18 ENCOUNTER — Inpatient Hospital Stay (HOSPITAL_COMMUNITY): Payer: BLUE CROSS/BLUE SHIELD

## 2017-11-18 LAB — GLUCOSE, CAPILLARY
GLUCOSE-CAPILLARY: 136 mg/dL — AB (ref 70–99)
GLUCOSE-CAPILLARY: 127 mg/dL — AB (ref 70–99)
GLUCOSE-CAPILLARY: 166 mg/dL — AB (ref 70–99)
GLUCOSE-CAPILLARY: 208 mg/dL — AB (ref 70–99)
Glucose-Capillary: 154 mg/dL — ABNORMAL HIGH (ref 70–99)

## 2017-11-18 LAB — CBC
HEMATOCRIT: 34.7 % — AB (ref 39.0–52.0)
Hemoglobin: 11.6 g/dL — ABNORMAL LOW (ref 13.0–17.0)
MCH: 29.4 pg (ref 26.0–34.0)
MCHC: 33.4 g/dL (ref 30.0–36.0)
MCV: 88.1 fL (ref 80.0–100.0)
NRBC: 0 % (ref 0.0–0.2)
PLATELETS: 116 10*3/uL — AB (ref 150–400)
RBC: 3.94 MIL/uL — ABNORMAL LOW (ref 4.22–5.81)
RDW: 14 % (ref 11.5–15.5)
WBC: 8.7 10*3/uL (ref 4.0–10.5)

## 2017-11-18 LAB — BASIC METABOLIC PANEL
Anion gap: 7 (ref 5–15)
BUN: 10 mg/dL (ref 6–20)
CO2: 26 mmol/L (ref 22–32)
CREATININE: 0.71 mg/dL (ref 0.61–1.24)
Calcium: 8.7 mg/dL — ABNORMAL LOW (ref 8.9–10.3)
Chloride: 102 mmol/L (ref 98–111)
Glucose, Bld: 139 mg/dL — ABNORMAL HIGH (ref 70–99)
Potassium: 3.9 mmol/L (ref 3.5–5.1)
SODIUM: 135 mmol/L (ref 135–145)

## 2017-11-18 MED ORDER — SODIUM CHLORIDE 0.9% FLUSH
3.0000 mL | Freq: Two times a day (BID) | INTRAVENOUS | Status: DC
  Administered 2017-11-18 – 2017-11-21 (×5): 3 mL via INTRAVENOUS

## 2017-11-18 MED ORDER — MOVING RIGHT ALONG BOOK
Freq: Once | Status: AC
  Administered 2017-11-18: 1
  Filled 2017-11-18: qty 1

## 2017-11-18 MED ORDER — SODIUM CHLORIDE 0.9 % IV SOLN: 250.0000 mL | INTRAVENOUS | Status: DC | PRN

## 2017-11-18 MED ORDER — SODIUM CHLORIDE 0.9% FLUSH: 3.0000 mL | INTRAVENOUS | Status: DC | PRN

## 2017-11-18 MED ORDER — METOCLOPRAMIDE HCL 5 MG/ML IJ SOLN
10.0000 mg | Freq: Four times a day (QID) | INTRAMUSCULAR | Status: AC
  Administered 2017-11-18 – 2017-11-19 (×4): 10 mg via INTRAVENOUS
  Filled 2017-11-18 (×4): qty 2

## 2017-11-18 MED ORDER — METOCLOPRAMIDE HCL 5 MG/ML IJ SOLN: 5.0000 mg | Freq: Four times a day (QID) | INTRAMUSCULAR | Status: DC

## 2017-11-18 NOTE — Progress Notes (Signed)
      [address]       [place] [address]             [phone]        CARDIOTHORACIC SURGERY PROGRESS NOTE   R2 Days Post-Op Procedure(s) (LRB): CORONARY ARTERY BYPASS GRAFTING (CABG) x 3. (N/A) RADIAL ARTERY HARVEST (Left) TRANSESOPHAGEAL ECHOCARDIOGRAM (TEE) (N/A) CYSTOSCOPY WITH URETHRAL DILATATION (N/A)  Subjective: Some nausea.  Otherwise doing well.  No abdominal pain.  No SOB  Objective: Vital signs: BP Readings from Last 1 Encounters:  11/18/17 113/73   Pulse Readings from Last 1 Encounters:  11/18/17 87   Resp Readings from Last 1 Encounters:  11/18/17 14   Temp Readings from Last 1 Encounters:  11/18/17 99 F (37.2 C) (Oral)    Hemodynamics: PAP: (32)/(17) 32/17  Physical Exam:  Rhythm:   sinus  Breath sounds: clear  Heart sounds:  RRR   Incisions:  Dressings dry, intact  Abdomen:  Soft, non-distended, non-tender  Extremities:  Warm, well-perfused  Chest tubes:  low volume thin serosanguinous output, no air leak    Intake/Output from previous day: 10/19 0701 - 10/20 0700 In: 1253.8 [P.O.:810; I.V.:243.8; IV Piggyback:200] Out: 1930 [Urine:1400; Chest Tube:530] Intake/Output this shift: No intake/output data recorded.  Lab Results:  CBC: Recent Labs    11/17/17 1827 11/18/17 0420  WBC 9.3 8.7  HGB 11.5* 11.6*  HCT 35.5* 34.7*  PLT 109* 116*    BMET:  Recent Labs    11/17/17 0423 11/17/17 1701 11/17/17 1827 11/18/17 0420  NA 135 136  --  135  K 4.4 4.3  --  3.9  CL 105 99  --  102  CO2 24  --   --  26  GLUCOSE 111* 154*  --  139*  BUN 7 12  --  10  CREATININE 0.71 0.80 0.86 0.71  CALCIUM 8.5*  --   --  8.7*     PT/INR:   Recent Labs    11/16/17 1725  LABPROT 16.4*  INR 1.34    CBG (last 3)  Recent Labs    11/17/17 1858 11/18/17 0419 11/18/17 0815  GLUCAP 136* 127* 166*    ABG    Component Value Date/Time   PHART 7.344 (L) 11/16/2017 2043   PCO2ART 44.5 11/16/2017 2043   PO2ART  131.0 (H) 11/16/2017 2043   HCO3 24.2 11/16/2017 2043   TCO2 26 11/17/2017 1701   ACIDBASEDEF 2.0 11/16/2017 2043   O2SAT 99.0 11/16/2017 2043    CXR: n/a  Assessment/Plan: S/P Procedure(s) (LRB): CORONARY ARTERY BYPASS GRAFTING (CABG) x 3. (N/A) RADIAL ARTERY HARVEST (Left) TRANSESOPHAGEAL ECHOCARDIOGRAM (TEE) (N/A) CYSTOSCOPY WITH URETHRAL DILATATION (N/A)  Doing well POD2 Maintaining NSR w/ stable BP Breathing comfortably on 3 L/min via [place] Expected post op acute blood loss anemia, mild Expected post op atelectasis, mild Expected post op volume excess, weight reportedly 9 kg > preop, UOP adequate Type II diabetes mellitus, excellent glycemic control Urethral stricture w/ BOO   Mobilize  Imdur for RA graft   Diuresis  D/C tubes  Leave Foley catheter in place  Purcell Nails, MD 11/18/2017 9:10 AM

## 2017-11-18 NOTE — Anesthesia Postprocedure Evaluation (Signed)
Anesthesia Post Note  Patient: Andre Ford  Procedure(s) Performed: CORONARY ARTERY BYPASS GRAFTING (CABG) x 3. (N/A Chest) RADIAL ARTERY HARVEST (Left ) TRANSESOPHAGEAL ECHOCARDIOGRAM (TEE) (N/A ) CYSTOSCOPY WITH URETHRAL DILATATION (N/A )     Patient location during evaluation: SICU Anesthesia Type: General Level of consciousness: sedated and patient remains intubated per anesthesia plan Pain management: pain level controlled Vital Signs Assessment: post-procedure vital signs reviewed and stable Respiratory status: patient remains intubated per anesthesia plan and patient on ventilator - see flowsheet for VS Cardiovascular status: stable and blood pressure returned to baseline Postop Assessment: no apparent nausea or vomiting Anesthetic complications: no    Last Vitals:  Vitals:   11/18/17 1100 11/18/17 1640  BP: 113/74   Pulse: 93   Resp:    Temp:  36.9 C  SpO2:      Last Pain:  Vitals:   11/18/17 1640  TempSrc: Oral  PainSc:                  Pammy Vesey COKER

## 2017-11-18 NOTE — Progress Notes (Signed)
Anesthesiology Follow-up:  37 year old male 2 days S/P CABG X 3   Awake and alert, neuro intact, in good spirits, walking around ICU without difficulty, taking PO well.  VS: T- 36.9 BP- 123/83 HR- 96 (NSR) RR- 22 O2 sat- 996% on RA  K- 3.9 Na- 135 BUN/Cr.- 10/0.71 Glucose- 139 H/H- 11.6/34.7 Platelets- 116,000  Extubated 2.5 hours post-op.  Doing well so far, no apparent, complications.  Kipp Brood

## 2017-11-19 ENCOUNTER — Inpatient Hospital Stay (HOSPITAL_COMMUNITY): Payer: BLUE CROSS/BLUE SHIELD

## 2017-11-19 ENCOUNTER — Encounter (HOSPITAL_COMMUNITY): Payer: Self-pay | Admitting: Thoracic Surgery (Cardiothoracic Vascular Surgery)

## 2017-11-19 LAB — CBC
HEMATOCRIT: 39.8 % (ref 39.0–52.0)
Hemoglobin: 12.9 g/dL — ABNORMAL LOW (ref 13.0–17.0)
MCH: 28.4 pg (ref 26.0–34.0)
MCHC: 32.4 g/dL (ref 30.0–36.0)
MCV: 87.5 fL (ref 80.0–100.0)
PLATELETS: 120 10*3/uL — AB (ref 150–400)
RBC: 4.55 MIL/uL (ref 4.22–5.81)
RDW: 13.8 % (ref 11.5–15.5)
WBC: 7.8 10*3/uL (ref 4.0–10.5)
nRBC: 0 % (ref 0.0–0.2)

## 2017-11-19 LAB — BASIC METABOLIC PANEL
Anion gap: 10 (ref 5–15)
BUN: 9 mg/dL (ref 6–20)
CHLORIDE: 101 mmol/L (ref 98–111)
CO2: 26 mmol/L (ref 22–32)
Calcium: 8.9 mg/dL (ref 8.9–10.3)
Creatinine, Ser: 0.79 mg/dL (ref 0.61–1.24)
GFR calc Af Amer: >60 mL/min (ref >60)
GFR calc non Af Amer: >60 mL/min (ref >60)
GLUCOSE: 157 mg/dL — AB (ref 70–99)
POTASSIUM: 3.4 mmol/L — AB (ref 3.5–5.1)
Sodium: 137 mmol/L (ref 135–145)

## 2017-11-19 LAB — GLUCOSE, CAPILLARY
GLUCOSE-CAPILLARY: 142 mg/dL — AB (ref 70–99)
GLUCOSE-CAPILLARY: 193 mg/dL — AB (ref 70–99)
GLUCOSE-CAPILLARY: 196 mg/dL — AB (ref 70–99)
GLUCOSE-CAPILLARY: 97 mg/dL (ref 70–99)
Glucose-Capillary: 155 mg/dL — ABNORMAL HIGH (ref 70–99)
Glucose-Capillary: 153 mg/dL — ABNORMAL HIGH (ref 70–99)
Glucose-Capillary: 101 mg/dL — ABNORMAL HIGH (ref 70–99)
Glucose-Capillary: 102 mg/dL — ABNORMAL HIGH (ref 70–99)

## 2017-11-19 MED ORDER — CANAGLIFLOZIN 100 MG PO TABS
100.0000 mg | ORAL_TABLET | Freq: Every day | ORAL | Status: DC
  Administered 2017-11-19 – 2017-11-21 (×3): 100 mg via ORAL
  Filled 2017-11-19 (×3): qty 1

## 2017-11-19 MED ORDER — INSULIN ASPART 100 UNIT/ML ~~LOC~~ SOLN: 0.0000 [IU] | Freq: Every day | SUBCUTANEOUS | Status: DC

## 2017-11-19 MED ORDER — METFORMIN HCL 500 MG PO TABS
1000.0000 mg | ORAL_TABLET | Freq: Two times a day (BID) | ORAL | Status: DC
  Administered 2017-11-19 – 2017-11-20 (×3): 1000 mg via ORAL
  Filled 2017-11-19 (×5): qty 2

## 2017-11-19 MED ORDER — INSULIN ASPART 100 UNIT/ML ~~LOC~~ SOLN
0.0000 [IU] | Freq: Three times a day (TID) | SUBCUTANEOUS | Status: DC
  Administered 2017-11-19: 3 [IU] via SUBCUTANEOUS
  Administered 2017-11-20: 2 [IU] via SUBCUTANEOUS
  Administered 2017-11-20: 3 [IU] via SUBCUTANEOUS

## 2017-11-19 MED ORDER — METOPROLOL TARTRATE 25 MG PO TABS
25.0000 mg | ORAL_TABLET | Freq: Two times a day (BID) | ORAL | Status: DC
  Administered 2017-11-19 – 2017-11-20 (×3): 25 mg via ORAL
  Filled 2017-11-19 (×3): qty 1

## 2017-11-19 MED FILL — Heparin Sodium (Porcine) Inj 1000 Unit/ML: INTRAMUSCULAR | Qty: 2500 | Status: AC

## 2017-11-19 NOTE — Plan of Care (Signed)
  Problem: Education: Goal: Knowledge of General Education information will improve Description Including pain rating scale, medication(s)/side effects and non-pharmacologic comfort measures Outcome: Progressing   Problem: Health Behavior/Discharge Planning: Goal: Ability to manage health-related needs will improve Outcome: Progressing   Problem: Clinical Measurements: Goal: Ability to maintain clinical measurements within normal limits will improve Outcome: Progressing   Problem: Coping: Goal: Level of anxiety will decrease Outcome: Progressing   Problem: Elimination: Goal: Will not experience complications related to bowel motility Outcome: Progressing   Problem: Elimination: Goal: Will not experience complications related to bowel motility Outcome: Progressing   Problem: Pain Managment: Goal: General experience of comfort will improve Outcome: Progressing   Problem: Skin Integrity: Goal: Risk for impaired skin integrity will decrease Outcome: Progressing   Problem: Respiratory: Goal: Respiratory status will improve Outcome: Progressing   Problem: Skin Integrity: Goal: Wound healing without signs and symptoms of infection Outcome: Progressing   Problem: Urinary Elimination: Goal: Ability to achieve and maintain adequate renal perfusion and functioning will improve Outcome: Progressing

## 2017-11-19 NOTE — Plan of Care (Signed)

## 2017-11-19 NOTE — Progress Notes (Signed)
3 Days Post-Op Procedure(s) (LRB): CORONARY ARTERY BYPASS GRAFTING (CABG) x 3. (N/A) RADIAL ARTERY HARVEST (Left) TRANSESOPHAGEAL ECHOCARDIOGRAM (TEE) (N/A) CYSTOSCOPY WITH URETHRAL DILATATION (N/A) Subjective: No complaints this AM Denies pain and nausea  Objective: Vital signs in last 24 hours: Temp:  [98.5 F (36.9 C)-100.1 F (37.8 C)] 98.7 F (37.1 C) (10/21 0300) Pulse Rate:  [91-109] 109 (10/21 0704) Cardiac Rhythm: Sinus tachycardia (10/21 0600) Resp:  [9-23] 22 (10/21 0704) BP: (106-140)/(71-89) 133/87 (10/21 0704) SpO2:  [91 %-98 %] 95 % (10/21 0704) Weight:  [114.9 kg] 114.9 kg (10/21 0600)  Hemodynamic parameters for last 24 hours:    Intake/Output from previous day: 10/20 0701 - 10/21 0700 In: 1290 [P.O.:1280; I.V.:10] Out: 2590 [Urine:2550; Chest Tube:40] Intake/Output this shift: No intake/output data recorded.  General appearance: alert, cooperative and no distress Neurologic: intact Heart: regular rate and rhythm Lungs: clear to auscultation bilaterally Extremities: well perfused  Lab Results: Recent Labs    11/17/17 1827 11/18/17 0420  WBC 9.3 8.7  HGB 11.5* 11.6*  HCT 35.5* 34.7*  PLT 109* 116*   BMET:  Recent Labs    11/17/17 0423 11/17/17 1701 11/17/17 1827 11/18/17 0420  NA 135 136  --  135  K 4.4 4.3  --  3.9  CL 105 99  --  102  CO2 24  --   --  26  GLUCOSE 111* 154*  --  139*  BUN 7 12  --  10  CREATININE 0.71 0.80 0.86 0.71  CALCIUM 8.5*  --   --  8.7*    PT/INR:  Recent Labs    11/16/17 1725  LABPROT 16.4*  INR 1.34   ABG    Component Value Date/Time   PHART 7.344 (L) 11/16/2017 2043   HCO3 24.2 11/16/2017 2043   TCO2 26 11/17/2017 1701   ACIDBASEDEF 2.0 11/16/2017 2043   O2SAT 99.0 11/16/2017 2043   CBG (last 3)  Recent Labs    11/19/17 0034 11/19/17 0349 11/19/17 0738  GLUCAP 142* 153* 196*    Assessment/Plan: S/P Procedure(s) (LRB): CORONARY ARTERY BYPASS GRAFTING (CABG) x 3. (N/A) RADIAL ARTERY  HARVEST (Left) TRANSESOPHAGEAL ECHOCARDIOGRAM (TEE) (N/A) CYSTOSCOPY WITH URETHRAL DILATATION (N/A) Plan for transfer to step-down: see transfer orders  Doing well POD # 3 CV- mild ST this AM- increase lopressor  ASA, Imdur, statin RESP- IS RENAL- creatinine OK yesterday, no labs today- recheck in AM ENDO- CBG mildly elevated- resume PO meds, SSI Ac and HS Anemia- secondary to ABL- mild, follow Thrombocytopenia stable   LOS: 5 days    Loreli Slot 11/19/2017

## 2017-11-19 NOTE — Progress Notes (Signed)
CARDIAC REHAB PHASE I   PRE:  Rate/Rhythm: 111 ST  BP:  Sitting: At hallway with nurse        SaO2: At hallway with nurse  MODE:  Ambulation: 1270 ft   POST:  Rate/Rhythm: 112 ST  BP:  Sitting: 132/84        SaO2: 98 RA  1425 - 1440  Pt ambulated independently 1270 ft. Gait was steady. Tolerated well. Pt reported minimal SOB. Pt back in bed with call bell.   Alisia Ferrari, MS 11/19/2017 2:39 PM

## 2017-11-20 LAB — CBC
HEMATOCRIT: 34.7 % — AB (ref 39.0–52.0)
Hemoglobin: 11.9 g/dL — ABNORMAL LOW (ref 13.0–17.0)
MCH: 29.4 pg (ref 26.0–34.0)
MCHC: 34.3 g/dL (ref 30.0–36.0)
MCV: 85.7 fL (ref 80.0–100.0)
Platelets: 153 10*3/uL (ref 150–400)
RBC: 4.05 MIL/uL — ABNORMAL LOW (ref 4.22–5.81)
RDW: 13.6 % (ref 11.5–15.5)
WBC: 7.7 10*3/uL (ref 4.0–10.5)
nRBC: 0 % (ref 0.0–0.2)

## 2017-11-20 LAB — GLUCOSE, CAPILLARY
GLUCOSE-CAPILLARY: 115 mg/dL — AB (ref 70–99)
Glucose-Capillary: 130 mg/dL — ABNORMAL HIGH (ref 70–99)
Glucose-Capillary: 174 mg/dL — ABNORMAL HIGH (ref 70–99)
Glucose-Capillary: 105 mg/dL — ABNORMAL HIGH (ref 70–99)

## 2017-11-20 LAB — BASIC METABOLIC PANEL
ANION GAP: 14 (ref 5–15)
BUN: 13 mg/dL (ref 6–20)
CALCIUM: 9.1 mg/dL (ref 8.9–10.3)
CO2: 20 mmol/L — AB (ref 22–32)
CREATININE: 0.79 mg/dL (ref 0.61–1.24)
Chloride: 106 mmol/L (ref 98–111)
GFR calc Af Amer: >60 mL/min (ref >60)
GFR calc non Af Amer: >60 mL/min (ref >60)
GLUCOSE: 168 mg/dL — AB (ref 70–99)
Potassium: 4.5 mmol/L (ref 3.5–5.1)
Sodium: 140 mmol/L (ref 135–145)

## 2017-11-20 MED ORDER — ALPRAZOLAM 0.25 MG PO TABS
0.1250 mg | ORAL_TABLET | Freq: Every day | ORAL | Status: DC
  Administered 2017-11-20: 0.125 mg via ORAL
  Filled 2017-11-20: qty 1

## 2017-11-20 MED ORDER — METOPROLOL TARTRATE 50 MG PO TABS
50.0000 mg | ORAL_TABLET | Freq: Two times a day (BID) | ORAL | Status: DC
  Administered 2017-11-20 – 2017-11-21 (×2): 50 mg via ORAL
  Filled 2017-11-20 (×2): qty 1

## 2017-11-20 MED FILL — Magnesium Sulfate Inj 50%: INTRAMUSCULAR | Qty: 10 | Status: AC

## 2017-11-20 MED FILL — Heparin Sodium (Porcine) Inj 1000 Unit/ML: INTRAMUSCULAR | Qty: 30 | Status: AC

## 2017-11-20 MED FILL — Potassium Chloride Inj 2 mEq/ML: INTRAVENOUS | Qty: 40 | Status: AC

## 2017-11-20 NOTE — Progress Notes (Signed)
Ed completed with pt and mom. Good reception, lots of questions. Will refer to [place] [facility]. He has been watching his diet and has lost 40 lbs recently. He stopped drinking after his pancreatitis three years ago. Gave video to set up. Will walk independently. [id number] Andre Ford CES, ACSM 12:01 PM 11/20/2017

## 2017-11-20 NOTE — Progress Notes (Addendum)
      [address]       [place] [address]             [phone]      4 Days Post-Op Procedure(s) (LRB): CORONARY ARTERY BYPASS GRAFTING (CABG) x 3. (N/A) RADIAL ARTERY HARVEST (Left) TRANSESOPHAGEAL ECHOCARDIOGRAM (TEE) (N/A) CYSTOSCOPY WITH URETHRAL DILATATION (N/A)   Subjective:  No new complaints.  Feels great.  Ambulating independently.  + BM  Objective: Vital signs in last 24 hours: Temp:  [98.2 F (36.8 C)-99 F (37.2 C)] 98.8 F (37.1 C) (10/22 1043) Pulse Rate:  [95-101] 101 (10/22 0525) Cardiac Rhythm: Normal sinus rhythm (10/22 0700) Resp:  [15-26] 16 (10/22 0525) BP: (111-135)/(70-85) 135/85 (10/22 0525) SpO2:  [93 %-95 %] 93 % (10/22 0525) Weight:  [113.4 kg] 113.4 kg (10/22 0525)  Intake/Output from previous day: 10/21 0701 - 10/22 0700 In: 480 [P.O.:480] Out: 1570 [Urine:1570] Intake/Output this shift: Total I/O In: 240 [P.O.:240] Out: 500 [Urine:500]  General appearance: alert, cooperative and no distress Heart: regular rate and rhythm Lungs: clear to auscultation bilaterally Abdomen: soft, non-tender; bowel sounds normal; no masses,  no organomegaly Extremities: edema trace Wound: clean and dry  Lab Results: Recent Labs    11/19/17 0709 11/20/17 0354  WBC 7.8 7.7  HGB 12.9* 11.9*  HCT 39.8 34.7*  PLT 120* 153   BMET:  Recent Labs    11/19/17 0709 11/20/17 0354  NA 137 140  K 3.4* 4.5  CL 101 106  CO2 26 20*  GLUCOSE 157* 168*  BUN 9 13  CREATININE 0.79 0.79  CALCIUM 8.9 9.1    PT/INR: No results for input(s): LABPROT, INR in the last 72 hours. ABG    Component Value Date/Time   PHART 7.344 (L) 11/16/2017 2043   HCO3 24.2 11/16/2017 2043   TCO2 26 11/17/2017 1701   ACIDBASEDEF 2.0 11/16/2017 2043   O2SAT 99.0 11/16/2017 2043   CBG (last 3)  Recent Labs    11/19/17 2148 11/20/17 0609 11/20/17 1119  GLUCAP 97 130* 174*    Assessment/Plan: S/P Procedure(s) (LRB): CORONARY ARTERY BYPASS  GRAFTING (CABG) x 3. (N/A) RADIAL ARTERY HARVEST (Left) TRANSESOPHAGEAL ECHOCARDIOGRAM (TEE) (N/A) CYSTOSCOPY WITH URETHRAL DILATATION (N/A)  1. CV- Sinus Tach, BP in the 130s at times- continue Imdur for radial graft, increase Lopressor to 50 mg BID 2. Pulm- no acute issues, continue IS 3. Renal- creatinine has been stable, weight is trending down, continue Lasix 4. DM- sugars have been mostly controlled, continue current DM regimen 5. Dispo- patient stable, will increase Lopressor to 50 mg BID for better HR control, d/c EPW today, patient's children are sick at home, will be going home with mom, who needs to get some things ready, if HR remains stable after removal of EPW, will plan to d/c in AM   LOS: 6 days    Lowella Dandy 11/20/2017 Patient seen and examined, agree with above Home in Am  [place] C. Dorris Fetch, MD [facility] [phone]

## 2017-11-20 NOTE — Progress Notes (Signed)
Pulled patient pacing wires and patient tolerated well. Patient vital signs are stable. He is alert and oriented and resting comfortably resting in bed. Pacing wires came out intact and without complication. Patient will be on bedrest for 1 hour. I will continue to monitor patient.

## 2017-11-20 NOTE — Discharge Summary (Signed)
Physician Discharge Summary  Patient ID: Andre Ford MRN: [id number] DOB/AGE: November 21, 1980 37 y.o.  Admit date: 11/12/2017 Discharge date: 11/21/2017  Admission Diagnoses: Severe coronary artery disease  Discharge Diagnoses:  Active Problems:   Angina at rest [facility])   S/P CABG x 3   Coronary artery disease  Patient Active Problem List   Diagnosis Date Noted  . S/P CABG x 3 11/16/2017  . Coronary artery disease 11/16/2017  . Angina at rest [facility]) 11/12/2017  . Hypercholesterolemia with hypertriglyceridemia 10/14/2014  . Type 2 diabetes mellitus with hyperglycemia (HCC) 10/14/2014  . Morbid obesity (HCC) 10/14/2014  . Acute pancreatitis   . Pancreatitis 10/12/2014  . Hepatic steatosis 10/12/2014  . Alcohol abuse, episodic 10/12/2014  . Obesity 10/12/2014   History of present illness: The patient is a 37 year old male with a history of type II diabetes, obesity, hypertension as well as hypertriglyceridemia and pancreatitis.  Additionally he has a history of tobacco and alcohol abuse.  Recently he has developed episodes of recurrent arm and neck pain with diaphoresis on exertion.  He underwent a an exercise nuclear stress test and showed severe inferior, and inferolateral ischemia.  He was felt to require cardiac catheterization and was admitted for the procedure.   Discharged Condition: good  Hospital Course: The patient underwent cardiac catheterization was found to have severe three-vessel disease.  Due to these findings cardiothoracic surgical consultation was obtained with Edwina Barth, MD who evaluated the patient and studies and agreed with recommendations to proceed with coronary artery surgical revascularization.  The patient was medically stabilized and on 11/16/2017 taken to the operating room where he underwent the below described procedure.  He tolerated well was taken to the surgical intensive care unit in stable condition.  Of note the patient did require urology  consultation in the operating room due to urethral stricture for placement of the Foley catheter.  Postoperative hospital course:  Patient is overall progressed well.  He has maintained stable hemodynamics initially requiring nitroglycerin drip, however this was weaned with difficulty.  He was weaned from the ventilator using standard protocols without difficulty.  Does have an expected acute blood loss anemia which is mild.  Most recent hemoglobin hematocrit are 11/34.  He does have some postoperative volume overload but has diuresed well.  His blood sugars have been under adequate control using standard protocols.  He has been placed on indoor which will continue as an outpatient for a short duration due to his radial artery conduit.  All routine lines, monitors and drainage devices have been discontinued in the standard fashion.  He has maintained sinus rhythm without significant ectopy or dysrhythmias.  He has been tachycardic at times and Lopressor has been uptitrated to control heart rate.  Renal function has remained normal.  From a urology perspective he will be discharged with his Foley catheter for a 1 week follow-up voiding trial as an outpatient.  Incisions are noted to be healing well without evidence of infection.  He is tolerating diet.  He is tolerating gradually increasing activity using standard cardiac rehab modalities.  Oxygen has been weaned and he maintains good saturations on room air.  At the time of discharge the patient is felt to be quite stable.  Consults: cardiology  Significant Diagnostic Studies: angiography: Cardiac catheterization  Treatments: surgery:  OPERATIVE REPORT  DATE OF PROCEDURE:  11/16/2017  PREOPERATIVE DIAGNOSIS:  Severe 3-vessel coronary artery disease.  POSTOPERATIVE DIAGNOSES:  Severe 3-vessel coronary disease plus urethral stricture.  PROCEDURE:  Median  sternotomy, extracorporeal circulation, coronary artery bypass grafting x3 (left internal  mammary artery to left anterior descending, free right internal mammary artery to the obtuse marginal, left radial artery to posterior  descending).  SURGEON:  Charlett Lango, MD  ASSISTANT:  Doree Fudge, PA-C  ANESTHESIA:  General.   Also: Preoperative diagnosis: urethral stricture  Postoperative diagnosis: Same  Procedure: 1. Urethral dilation from 8 french to 18 french  Attending: Wilkie Aye  Anesthesia: General  History of blood loss: Minimal  Antibiotics: cefuroxime  Specimens: none  Drains: 16 french foley catheter  Discharge Exam: Blood pressure 112/78, pulse 92, temperature 98.7 F (37.1 C), temperature source Oral, resp. rate 16, height 5\' 10"  (1.778 m), weight 112 kg, SpO2 100 %.  General appearance: alert, cooperative and no distress Heart: regular rate and rhythm Lungs: slightly dim in bases Abdomen: benign Extremities: trace edema, left hand N/V intact Wound: incis healing well  Disposition: Discharge disposition: 01-Home or Self Care       Discharge Instructions    Amb Referral to Cardiac Rehabilitation   Complete by:  As directed    Diagnosis:  CABG   CABG X ___:  3   Discharge patient   Complete by:  As directed    Discharge disposition:  01-Home or Self Care   Discharge patient date:  11/21/2017     Allergies as of 11/21/2017   No Known Allergies     Medication List    STOP taking these medications   [facility] ASPIRIN ADULT LOW DOSE 81 MG chewable tablet Generic drug:  aspirin Replaced by:  aspirin 325 MG EC tablet   gemfibrozil 600 MG tablet Commonly known as:  LOPID   meloxicam 15 MG tablet Commonly known as:  MOBIC   nitroGLYCERIN 0.4 MG SL tablet Commonly known as:  NITROSTAT   ondansetron 4 MG disintegrating tablet Commonly known as:  ZOFRAN-ODT   oxyCODONE-acetaminophen 5-325 MG tablet Commonly known as:  PERCOCET/ROXICET     TAKE these medications   ALPRAZolam 0.5 MG tablet Commonly  known as:  XANAX Take 0.5 mg by mouth 2 (two) times daily as needed for anxiety.   aspirin 325 MG EC tablet Take 1 tablet (325 mg total) by mouth daily. Replaces:  [facility] ASPIRIN ADULT LOW DOSE 81 MG chewable tablet   escitalopram 10 MG tablet Commonly known as:  LEXAPRO Take 10 mg by mouth daily.   isosorbide mononitrate 30 MG 24 hr tablet Commonly known as:  IMDUR Take 0.5 tablets (15 mg total) by mouth daily.   JARDIANCE 25 MG Tabs tablet Generic drug:  empagliflozin Take 25 mg by mouth daily.   metFORMIN 500 MG tablet Commonly known as:  GLUCOPHAGE Take 1 tablet (500 mg total) 2 (two) times daily by mouth. What changed:  Another medication with the same name was removed. Continue taking this medication, and follow the directions you see here.   metoprolol tartrate 50 MG tablet Commonly known as:  LOPRESSOR Take 1 tablet (50 mg total) by mouth 2 (two) times daily.   oxyCODONE 5 MG immediate release tablet Commonly known as:  Oxy IR/ROXICODONE Take 1-2 tablets (5-10 mg total) by mouth every 6 (six) hours as needed for up to 5 days for severe pain.   rosuvastatin 10 MG tablet Commonly known as:  CRESTOR Take 1 tablet (10 mg total) by mouth daily at 6 PM.   tamsulosin 0.4 MG Caps capsule Commonly known as:  FLOMAX 1 q HS to aid stone passage  VASCEPA 1 g Caps Generic drug:  Icosapent Ethyl Take 2 capsules by mouth 2 (two) times daily.      Follow-up Information    Patwardhan, Anabel Bene, MD Follow up.   Specialty:  Cardiology Why:  Appointment to see cardiology on 11/29/2017 at 2 PM Contact information: [address] [place] [address] [phone]        Loreli Slot, MD Follow up.   Specialty:  Cardiothoracic Surgery Why:  Appointment see the surgeon on December 18, 2017 at 10 AM.  Please obtain a chest x-ray at [facility] imaging at 9:30 AM.  [facility] imaging is located in the same office complex on the first floor. Contact information: [address] [place] [address] [phone]        Hillery Aldo, NP Follow up.   Specialty:  Nurse Practitioner Why:  Appointment at the urologist on 11/28/2017 at 10:15 AM. Contact information: [address] [place] [address] [phone]          The patient has been discharged on:   1.Beta Blocker:  Yes Cove.Etienne   ]                              No   [   ]                              If No, reason:  2.Ace Inhibitor/ARB: Yes [   ]                                     No  [  n  ]                                     If No, reason:labile BP  3.Statin:   Yes Cove.Etienne   ]                  No  [   ]                  If No, reason:  4.Ecasa:  Yes  [ y  ]                  No   [   ]                  If No, reason:  Signed: Glenice Laine Nachum Derossett 11/21/2017, 8:00 AM

## 2017-11-20 NOTE — Discharge Instructions (Signed)
Endoscopic Saphenous Vein Harvesting, Care After °Refer to this sheet in the next few weeks. These instructions provide you with information about caring for yourself after your procedure. Your health care provider may also give you more specific instructions. Your treatment has been planned according to current medical practices, but problems sometimes occur. Call your health care provider if you have any problems or questions after your procedure. °What can I expect after the procedure? °After the procedure, it is common to have: °· Pain. °· Bruising. °· Swelling. °· Numbness. ° °Follow these instructions at home: °Medicine °· Take over-the-counter and prescription medicines only as told by your health care provider. °· Do not drive or operate heavy machinery while taking prescription pain medicine. °Incision care ° °· Follow instructions from your health care provider about how to take care of the cut made during surgery (incision). Make sure you: °? Wash your hands with soap and water before you change your bandage (dressing). If soap and water are not available, use hand sanitizer. °? Change your dressing as told by your health care provider. °? Leave stitches (sutures), skin glue, or adhesive strips in place. These skin closures may need to be in place for 2 weeks or longer. If adhesive strip edges start to loosen and curl up, you may trim the loose edges. Do not remove adhesive strips completely unless your health care provider tells you to do that. °· Check your incision area every day for signs of infection. Check for: °? More redness, swelling, or pain. °? More fluid or blood. °? Warmth. °? Pus or a bad smell. °General instructions °· Raise (elevate) your legs above the level of your heart while you are sitting or lying down. °· Do any exercises your health care providers have given you. These may include deep breathing, coughing, and walking exercises. °· Do not shower, take baths, swim, or use a hot tub  unless told by your health care provider. °· Wear your elastic stocking if told by your health care provider. °· Keep all follow-up visits as told by your health care provider. This is important. °Contact a health care provider if: °· Medicine does not help your pain. °· Your pain gets worse. °· You have new leg bruises or your leg bruises get bigger. °· You have a fever. °· Your leg feels numb. °· You have more redness, swelling, or pain around your incision. °· You have more fluid or blood coming from your incision. °· Your incision feels warm to the touch. °· You have pus or a bad smell coming from your incision. °Get help right away if: °· Your pain is severe. °· You develop pain, tenderness, warmth, redness, or swelling in any part of your leg. °· You have chest pain. °· You have trouble breathing. °This information is not intended to replace advice given to you by your health care provider. Make sure you discuss any questions you have with your health care provider. °Document Released: 09/28/2010 Document Revised: 06/24/2015 Document Reviewed: 11/30/2014 °Elsevier Interactive Patient Education © 2018 [employer] °Coronary Artery Bypass Grafting, Care After °These instructions give you information on caring for yourself after your procedure. Your doctor may also give you more specific instructions. Call your doctor if you have any problems or questions after your procedure. °Follow these instructions at home: °· Only take medicine as told by your doctor. Take medicines exactly as told. Do not stop taking medicines or start any new medicines without talking to your doctor first. °·   Take your pulse as told by your doctor. °· Do deep breathing as told by your doctor. Use your breathing device (incentive spirometer), if given, to practice deep breathing several times a day. Support your chest with a pillow or your arms when you take deep breaths or cough. °· Keep the area clean, dry, and protected where the  surgery cuts (incisions) were made. Remove bandages (dressings) only as told by your doctor. If strips were applied to surgical area, do not take them off. They fall off on their own. °· Check the surgery area daily for puffiness (swelling), redness, or leaking fluid. °· If surgery cuts were made in your legs: °? Avoid crossing your legs. °? Avoid sitting for long periods of time. Change positions every 30 minutes. °? Raise your legs when you are sitting. Place them on pillows. °· Wear stockings that help keep blood clots from forming in your legs (compression stockings). °· Only take sponge baths until your doctor says it is okay to take showers. Pat the surgery area dry. Do not rub the surgery area with a washcloth or towel. Do not bathe, swim, or use a hot tub until your doctor says it is okay. °· Eat foods that are high in fiber. These include raw fruits and vegetables, whole grains, beans, and nuts. Choose lean meats. Avoid canned, processed, and fried foods. °· Drink enough fluids to keep your pee (urine) clear or pale yellow. °· Weigh yourself every day. °· Rest and limit activity as told by your doctor. You may be told to: °? Stop any activity if you have chest pain, shortness of breath, changes in heartbeat, or dizziness. Get help right away if this happens. °? Move around often for short amounts of time or take short walks as told by your doctor. Gradually become more active. You may need help to strengthen your muscles and build endurance. °? Avoid lifting, pushing, or pulling anything heavier than 10 pounds (4.5 kg) for at least 6 weeks after surgery. °· Do not drive until your doctor says it is okay. °· Ask your doctor when you can go back to work. °· Ask your doctor when you can begin sexual activity again. °· Follow up with your doctor as told. °Contact a doctor if: °· You have puffiness, redness, more pain, or fluid draining from the incision site. °· You have a fever. °· You have puffiness in your  ankles or legs. °· You have pain in your legs. °· You gain 2 or more pounds (0.9 kg) a day. °· You feel sick to your stomach (nauseous) or throw up (vomit). °· You have watery poop (diarrhea). °Get help right away if: °· You have chest pain that goes to your jaw or arms. °· You have shortness of breath. °· You have a fast or irregular heartbeat. °· You notice a "clicking" in your breastbone when you move. °· You have numbness or weakness in your arms or legs. °· You feel dizzy or light-headed. °This information is not intended to replace advice given to you by your health care provider. Make sure you discuss any questions you have with your health care provider. °Document Released: 01/21/2013 Document Revised: 06/24/2015 Document Reviewed: 06/25/2012 °Elsevier Interactive Patient Education © 2017 Elsevier Inc. ° °

## 2017-11-21 LAB — GLUCOSE, CAPILLARY
Glucose-Capillary: 104 mg/dL — ABNORMAL HIGH (ref 70–99)
Glucose-Capillary: 153 mg/dL — ABNORMAL HIGH (ref 70–99)

## 2017-11-21 MED ORDER — ROSUVASTATIN CALCIUM 10 MG PO TABS: 10.0000 mg | ORAL_TABLET | Freq: Every day | ORAL | 1 refills | Status: DC

## 2017-11-21 MED ORDER — METOPROLOL TARTRATE 50 MG PO TABS: 50.0000 mg | ORAL_TABLET | Freq: Two times a day (BID) | ORAL | 0 refills | Status: DC

## 2017-11-21 MED ORDER — OXYCODONE HCL 5 MG PO TABS: 5.0000 mg | ORAL_TABLET | Freq: Four times a day (QID) | ORAL | 0 refills | Status: DC | PRN

## 2017-11-21 MED ORDER — ASPIRIN 325 MG PO TBEC: 325.0000 mg | DELAYED_RELEASE_TABLET | Freq: Every day | ORAL | Status: DC

## 2017-11-21 MED ORDER — ISOSORBIDE MONONITRATE ER 30 MG PO TB24: 15.0000 mg | ORAL_TABLET | Freq: Every day | ORAL | 0 refills | Status: DC

## 2017-11-21 NOTE — Progress Notes (Signed)
[address]       [place] [address]             [phone]      5 Days Post-Op Procedure(s) (LRB): CORONARY ARTERY BYPASS GRAFTING (CABG) x 3. (N/A) RADIAL ARTERY HARVEST (Left) TRANSESOPHAGEAL ECHOCARDIOGRAM (TEE) (N/A) CYSTOSCOPY WITH URETHRAL DILATATION (N/A) Subjective: Feels well  Objective: Vital signs in last 24 hours: Temp:  [98.7 F (37.1 C)-99.6 F (37.6 C)] 98.7 F (37.1 C) (10/23 0624) Pulse Rate:  [92-95] 92 (10/23 0624) Cardiac Rhythm: Normal sinus rhythm (10/22 2045) Resp:  [16-19] 16 (10/23 0624) BP: (112-126)/(75-78) 112/78 (10/23 0624) SpO2:  [96 %-100 %] 100 % (10/23 0624) Weight:  [132 kg] 112 kg (10/23 0619)  Hemodynamic parameters for last 24 hours:    Intake/Output from previous day: 10/22 0701 - 10/23 0700 In: 640 [P.O.:640] Out: 2325 [Urine:2325] Intake/Output this shift: No intake/output data recorded.  General appearance: alert, cooperative and no distress Heart: regular rate and rhythm Lungs: slightly dim in bases Abdomen: benign Extremities: trace edema, left hand N/V intact Wound: incis healing well  Lab Results: Recent Labs    11/19/17 0709 11/20/17 0354  WBC 7.8 7.7  HGB 12.9* 11.9*  HCT 39.8 34.7*  PLT 120* 153   BMET:  Recent Labs    11/19/17 0709 11/20/17 0354  NA 137 140  K 3.4* 4.5  CL 101 106  CO2 26 20*  GLUCOSE 157* 168*  BUN 9 13  CREATININE 0.79 0.79  CALCIUM 8.9 9.1    PT/INR: No results for input(s): LABPROT, INR in the last 72 hours. ABG    Component Value Date/Time   PHART 7.344 (L) 11/16/2017 2043   HCO3 24.2 11/16/2017 2043   TCO2 26 11/17/2017 1701   ACIDBASEDEF 2.0 11/16/2017 2043   O2SAT 99.0 11/16/2017 2043   CBG (last 3)  Recent Labs    11/20/17 1617 11/20/17 2109 11/21/17 0622  GLUCAP 105* 115* 104*    Meds Scheduled Meds: . acetaminophen  1,000 mg Oral Q6H  . ALPRAZolam  0.125 mg Oral QHS  . aspirin EC  325 mg Oral Daily  . bisacodyl  10 mg  Oral Daily   Or  . bisacodyl  10 mg Rectal Daily  . canagliflozin  100 mg Oral QAC breakfast  . docusate sodium  200 mg Oral Daily  . enoxaparin (LOVENOX) injection  40 mg Subcutaneous QHS  . escitalopram  10 mg Oral Daily  . insulin aspart  0-15 Units Subcutaneous TID WC  . insulin aspart  0-5 Units Subcutaneous QHS  . isosorbide mononitrate  15 mg Oral Daily  . mouth rinse  15 mL Mouth Rinse BID  . metFORMIN  1,000 mg Oral BID WC  . metoprolol tartrate  50 mg Oral BID  . omega-3 acid ethyl esters  2 g Oral BID  . pantoprazole  40 mg Oral Daily  . rosuvastatin  10 mg Oral q1800  . sodium chloride flush  3 mL Intravenous Q12H  . tamsulosin  0.4 mg Oral QHS   Continuous Infusions: . sodium chloride     PRN Meds:.sodium chloride, metoprolol tartrate, ondansetron (ZOFRAN) IV, oxyCODONE, sodium chloride flush, traMADol  Xrays No results found.  Assessment/Plan: S/P Procedure(s) (LRB): CORONARY ARTERY BYPASS GRAFTING (CABG) x 3. (N/A) RADIAL ARTERY HARVEST (Left) TRANSESOPHAGEAL ECHOCARDIOGRAM (TEE) (N/A) CYSTOSCOPY WITH URETHRAL DILATATION (N/A)  1 conts to do well 2 hemodyn stable in sinus rhythm 3 no new labs, sugars reasonable  for inpatient on current rx- discussed lifestyle and nutrition recs 4 stable for disharge    LOS: 7 days    Rowe Clack [facility] 11/21/2017 Pager [phone]

## 2017-11-23 ENCOUNTER — Telehealth (HOSPITAL_COMMUNITY): Payer: Self-pay

## 2017-11-23 NOTE — Telephone Encounter (Signed)
Pt insurance is active and benefits verified through [place]. Co-pay $0.00, DED $3,800.00/$682.50 met, out of pocket $6,300.00/$1,318.12 met, co-insurance 30%. No pre-authorization. Passport, 11/23/17 @ 4:11PM, REF# [id number]  Will contact patient to see if he is interested in the Cardiac Rehab Program. If interested, patient will need to complete follow up appt. Once completed, patient will be contacted for scheduling upon review by the RN Navigator.

## 2017-11-26 ENCOUNTER — Telehealth: Payer: Self-pay

## 2017-11-26 DIAGNOSIS — G8918 Other acute postprocedural pain: Secondary | ICD-10-CM

## 2017-11-26 DIAGNOSIS — R112 Nausea with vomiting, unspecified: Secondary | ICD-10-CM

## 2017-11-26 DIAGNOSIS — Z9889 Other specified postprocedural states: Secondary | ICD-10-CM

## 2017-11-26 MED ORDER — PROMETHAZINE HCL 12.5 MG PO TABS: 12.5000 mg | ORAL_TABLET | Freq: Four times a day (QID) | ORAL | 0 refills | Status: DC | PRN

## 2017-11-26 MED ORDER — TRAMADOL HCL 50 MG PO TABS: 50.0000 mg | ORAL_TABLET | Freq: Four times a day (QID) | ORAL | 0 refills | Status: DC | PRN

## 2017-11-26 NOTE — Telephone Encounter (Signed)
Patient' wife called for husband who is requesting different medication for post op pain, and states that Mr Berkel has been nauseous off and on since hospital discharge. [facility] Tramadol 50mg  1 tablet every 6 hours prn/pain and promethazine 12.5mg  every 6 hours prn/nausea

## 2017-11-27 ENCOUNTER — Telehealth (HOSPITAL_COMMUNITY): Payer: Self-pay

## 2017-11-27 NOTE — Telephone Encounter (Signed)
Called patient to see if he is interested in the Cardiac Rehab Program. Patient expressed interest. Explained scheduling process and went over insurance, patient verbalized understanding. Will contact patient for scheduling once f/u has been completed.  °

## 2017-11-28 DIAGNOSIS — N35014 Post-traumatic urethral stricture, male, unspecified: Secondary | ICD-10-CM | POA: Diagnosis not present

## 2017-11-30 MED FILL — Sodium Bicarbonate IV Soln 8.4%: INTRAVENOUS | Qty: 50 | Status: AC

## 2017-11-30 MED FILL — Lidocaine HCl Local Preservative Free (PF) Inj 1%: INTRAMUSCULAR | Qty: 2 | Status: AC

## 2017-11-30 MED FILL — Electrolyte-R (PH 7.4) Solution: INTRAVENOUS | Qty: 3000 | Status: AC

## 2017-11-30 MED FILL — Mannitol IV Soln 20%: INTRAVENOUS | Qty: 500 | Status: AC

## 2017-11-30 MED FILL — Sodium Chloride IV Soln 0.9%: INTRAVENOUS | Qty: 2000 | Status: AC

## 2017-11-30 MED FILL — Heparin Sodium (Porcine) Inj 1000 Unit/ML: INTRAMUSCULAR | Qty: 20 | Status: AC

## 2017-12-03 ENCOUNTER — Encounter: Payer: Self-pay | Admitting: Physician Assistant

## 2017-12-03 ENCOUNTER — Ambulatory Visit: Payer: BLUE CROSS/BLUE SHIELD | Admitting: Physician Assistant

## 2017-12-03 VITALS — BP 100/80 | HR 89 | Ht 70.0 in | Wt 228.1 lb

## 2017-12-03 DIAGNOSIS — I208 Other forms of angina pectoris: Secondary | ICD-10-CM

## 2017-12-03 DIAGNOSIS — I25708 Atherosclerosis of coronary artery bypass graft(s), unspecified, with other forms of angina pectoris: Secondary | ICD-10-CM

## 2017-12-03 DIAGNOSIS — Z951 Presence of aortocoronary bypass graft: Secondary | ICD-10-CM

## 2017-12-03 DIAGNOSIS — I952 Hypotension due to drugs: Secondary | ICD-10-CM

## 2017-12-03 DIAGNOSIS — K921 Melena: Secondary | ICD-10-CM

## 2017-12-03 MED ORDER — ESOMEPRAZOLE MAGNESIUM 40 MG PO CPDR: 40.0000 mg | DELAYED_RELEASE_CAPSULE | Freq: Every day | ORAL | 3 refills | Status: DC

## 2017-12-03 NOTE — Progress Notes (Signed)
Cardiology Office Note    Date:  12/03/2017   ID:  Andre Ford, DOB 1980-06-11, MRN [id number]  PCP:  Adrian Prince, MD  Cardiologist:  Dr. Excell Seltzer   Chief Complaint: Hospital follow up   History of Present Illness:   Andre Ford is a 37 y.o. male type 2 DM, former tobacco use, chronic pancreatitis and recent CAD s/p CABG presented for folllow up.   Admitted 11/13/17 for progressive angina. He was noted to have severe ischemia in the inferior and lateral walls on nuclear stress testing and underwent diagnostic catheterization by Dr Rosemary Holms demonstrating severe 3 vessel CAD. He requested to follow with Dr. Excell Seltzer since he have cared several of patient's family members and friends. Underwent CABG x (left internal mammary artery to left anterior descending, free right internal mammary artery to the obtuse marginal, left radial artery to posterior descending). Discharged on foley due to urethral stricture.   Here today for follow up. Patient has been nauseated since discharge.  One episode of non-bloody nonbilious vomiting.  Denies diarrhea or constipation.  Low appetite.  Symptoms somewhat better today after good portion of meal yesterday.  Denies chest pain, shortness of breath, palpitation, lower extremity edema, orthopnea, PND or syncope.  He has noted dark stool without blood.   Past Medical History:  Diagnosis Date  . Anxiety   . Arthritis    "qwhere" (11/14/2017)  . At risk for sleep apnea    STOP-BANG= 6     SENT TO PCP 08-05-2014  . Chronic pancreatitis (HCC)   . Coronary artery disease   . Depression   . High triglycerides   . History of kidney stones   . Hyperlipidemia   . Hypertension    "lost wright and this went away" (11/14/2017)  . Obesity   . Type II diabetes mellitus (HCC)    "dx'd 08/2017" (11/14/2017)  . Urethral stricture     Past Surgical History:  Procedure Laterality Date  . APPENDECTOMY  age 89  . CARDIAC CATHETERIZATION    . CORONARY  ARTERY BYPASS GRAFT N/A 11/16/2017   Procedure: CORONARY ARTERY BYPASS GRAFTING (CABG) x 3.;  Surgeon: Loreli Slot, MD;  Location: [facility] OR;  Service: Open Heart Surgery;  Laterality: N/A;  BILATERAL IMAS  . CYSTOSCOPY WITH URETHRAL DILATATION N/A 08/10/2014   Procedure: CYSTOSCOPY WITH URETHRAL DILATATION;  Surgeon: Malen Gauze, MD;  Location[facility];  Service: Urology;  Laterality: N/A;  . CYSTOSCOPY WITH URETHRAL DILATATION N/A 11/16/2017   Procedure: CYSTOSCOPY WITH URETHRAL DILATATION;  Surgeon: Loreli Slot, MD;  Location: [facility] OR;  Service: Open Heart Surgery;  Laterality: N/A;  performed by Dr. Ronne Binning  . FRACTURE SURGERY    . KNEE ARTHROSCOPY WITH ANTERIOR CRUCIATE LIGAMENT (ACL) REPAIR Left 2000  . LEFT HEART CATH AND CORONARY ANGIOGRAPHY N/A 11/12/2017   Procedure: LEFT HEART CATH AND CORONARY ANGIOGRAPHY;  Surgeon: Elder Negus, MD;  Location: [facility] INVASIVE CV LAB;  Service: Cardiovascular;  Laterality: N/A;  . OPEN REDUCTION INTERNAL FIXATION (ORIF) HAND Right 2000   "boxer fx"  . PENILE BIOPSY N/A 08/10/2014   Procedure: Core biopsy of penile glands lesion;  Surgeon: Malen Gauze, MD;  Location[facility];  Service: Urology;  Laterality: N/A;  . RADIAL ARTERY HARVEST Left 11/16/2017   Procedure: RADIAL ARTERY HARVEST;  Surgeon: Loreli Slot, MD;  Location: [facility] OR;  Service: Open Heart Surgery;  Laterality: Left;  . TEE WITHOUT CARDIOVERSION N/A  11/16/2017   Procedure: TRANSESOPHAGEAL ECHOCARDIOGRAM (TEE);  Surgeon: Loreli Slot, MD;  Location: [facility] OR;  Service: Open Heart Surgery;  Laterality: N/A;  . URETHROPLASTY  2016    Current Medications: Prior to Admission medications   Medication Sig Start Date End Date Taking? Authorizing Provider  ALPRAZolam Prudy Feeler) 0.5 MG tablet Take 0.5 mg by mouth 2 (two) times daily as needed for anxiety. 09/03/17   [provider]  aspirin EC 325 MG EC  tablet Take 1 tablet (325 mg total) by mouth daily. 11/21/17   Gold, Wayne E, PA-C  escitalopram (LEXAPRO) 10 MG tablet Take 10 mg by mouth daily. 10/30/17   [provider]  isosorbide mononitrate (IMDUR) 30 MG 24 hr tablet Take 0.5 tablets (15 mg total) by mouth daily. 11/21/17   Gold, Wayne E, PA-C  JARDIANCE 25 MG TABS tablet Take 25 mg by mouth daily. 11/04/17   [provider]  metFORMIN (GLUCOPHAGE) 500 MG tablet Take 1 tablet (500 mg total) 2 (two) times daily by mouth. Patient not taking: Reported on 11/12/2017 12/14/16   Ward, Layla Maw, DO  metoprolol tartrate (LOPRESSOR) 50 MG tablet Take 1 tablet (50 mg total) by mouth 2 (two) times daily. 11/21/17   Gold, Glenice Laine, PA-C  promethazine (PHENERGAN) 12.5 MG tablet Take 1 tablet (12.5 mg total) by mouth every 6 (six) hours as needed for nausea or vomiting. 11/26/17   Loreli Slot, MD  rosuvastatin (CRESTOR) 10 MG tablet Take 1 tablet (10 mg total) by mouth daily at 6 PM. 11/21/17   Gold, Glenice Laine, PA-C  tamsulosin (FLOMAX) 0.4 MG CAPS capsule 1 q HS to aid stone passage Patient not taking: Reported on 11/12/2017 01/24/17   Mancel Bale, MD  traMADol (ULTRAM) 50 MG tablet Take 1 tablet (50 mg total) by mouth every 6 (six) hours as needed. 11/26/17   Loreli Slot, MD  VASCEPA 1 g CAPS Take 2 capsules by mouth 2 (two) times daily. 11/04/17   [provider]    Allergies:   Patient has no known allergies.   Social History   Socioeconomic History  . Marital status: Married    Spouse name: Not on file  . Number of children: Not on file  . Years of education: Not on file  . Highest education level: Not on file  Occupational History  . Not on file  Social Needs  . Financial resource strain: Not on file  . Food insecurity:    Worry: Not on file    Inability: Not on file  . Transportation needs:    Medical: Not on file    Non-medical: Not on file  Tobacco Use  . Smoking status: Former  Smoker    Packs/day: 1.00    Years: 10.00    Pack years: 10.00    Types: Cigarettes    Last attempt to quit: 08/05/2011    Years since quitting: 6.3  . Smokeless tobacco: Former Neurosurgeon    Types: Snuff  Substance and Sexual Activity  . Alcohol use: Yes    Comment: 11/14/2017 "nothing since pancreatitis dx'd in 2016"  . Drug use: Not Currently  . Sexual activity: Not on file  Lifestyle  . Physical activity:    Days per week: Not on file    Minutes per session: Not on file  . Stress: Not on file  Relationships  . Social connections:    Talks on phone: Not on file    Gets together: Not on  file    Attends religious service: Not on file    Active member of club or organization: Not on file    Attends meetings of clubs or organizations: Not on file    Relationship status: Not on file  Other Topics Concern  . Not on file  Social History Narrative  . Not on file     Family History:  The patient's family history is not on file.   ROS:   Please see the history of present illness.    ROS All other systems reviewed and are negative.   PHYSICAL EXAM:   VS:  BP 100/80   Pulse 89   Ht 5\' 10"  (1.778 m)   Wt 228 lb 1.9 oz (103.5 kg)   SpO2 96%   BMI 32.73 kg/m    GEN: Well nourished, well developed, in no acute distress  HEENT: normal  Neck: no JVD, carotid bruits, or masses Cardiac: RRR; no murmurs, rubs, or gallops,no edema. surgical scar healing well Respiratory:  clear to auscultation bilaterally, normal work of breathing GI: soft, nontender, nondistended, + BS MS: no deformity or atrophy  Skin: warm and dry, no rash Neuro:  Alert and Oriented x 3, Strength and sensation are intact Psych: euthymic mood, full affect  Wt Readings from Last 3 Encounters:  12/03/17 228 lb 1.9 oz (103.5 kg)  11/21/17 247 lb (112 kg)  01/24/17 260 lb (117.9 kg)      Studies/Labs Reviewed:   EKG:  EKG is ordered today.  The ekg ordered today demonstrates sinus rhythm with anterior T wave  inversion  Recent Labs: 01/24/2017: ALT 30 11/17/2017: Magnesium 1.9 11/20/2017: BUN 13; Creatinine, Ser 0.79; Hemoglobin 11.9; Platelets 153; Potassium 4.5; Sodium 140   Lipid Panel    Component Value Date/Time   CHOL 232 (H) 10/13/2014 0500   TRIG 837 (H) 10/13/2014 0500   HDL 26 (L) 10/13/2014 0500   CHOLHDL 8.9 10/13/2014 0500   VLDL UNABLE TO CALCULATE IF TRIGLYCERIDE OVER 400 mg/dL 95/28/4132 4401   LDLCALC UNABLE TO CALCULATE IF TRIGLYCERIDE OVER 400 mg/dL 02/72/5366 4403    Additional studies/ records that were reviewed today include:   Echocardiogram: 11/12/17 Study Conclusions  - Left ventricle: The cavity size was normal. Wall thickness was   normal. Systolic function was normal. The estimated ejection   fraction was in the range of 55% to 60%. Wall motion was normal;   there were no regional wall motion abnormalities. Left   ventricular diastolic function parameters were normal. - No significant valvular abnormality. - Inadequate TR jet to estimate PASP  LEFT HEART CATH AND CORONARY ANGIOGRAPHY 11/12/17  Conclusion   LM: Distal calcified 40% stenosis LAD: Prox calcified 40% stenosis        Mid 95% stenosis LCx: Mid long 95% stenosis RCA: Prox 95%, mid to distal long diffuse 75% stenosis  LVEDP normal LVEDP 55-60%  Recommendation: Admit to telemetry Continue aspirin/heparin/statin CVTS consult for CABG in view of uncontrolled DM, hypertriglyceridemia and severe multivessel CAD   Diagnostic Diagram         ASSESSMENT & PLAN:    1. CAD s/p CABG x 3 -No angina.  Continue aspirin, low-dose Crestor beta-blocker.  2.  Melena/nausea -Upset stomach since discharge with ongoing nausea and dark stools.  One episode of vomiting.  No diarrhea.  He has been on metformin for a long time.  Reviewed with pharmacy, Vascepa can cause nausea.  Hold for now.  Check stool guaiac and CBC.  Start  PPI.   3 DM - Continue metformin and Jardiance  4.  Hypotension - BP of 100/80. Intermittent "ozziness". Advised to increase PO. Hold Imdur. Continue BB.    Medication Adjustments/Labs and Tests Ordered: Current medicines are reviewed at length with the patient today.  Concerns regarding medicines are outlined above.  Medication changes, Labs and Tests ordered today are listed in the Patient Instructions below. Patient Instructions  Medication Instructions:  Your physician has recommended you make the following change in your medication:  1.  STOP the Vascep 2.  START Nexium 40 mg daily 3.  STOP the Imdur   If you need a refill on your cardiac medications before your next appointment, please call your pharmacy.   Lab work: TODAY:  CBC & FECCULT OCCULT BLOOD CARD  If you have labs (blood work) drawn today and your tests are completely normal, you will receive your results only by: Marland Kitchen MyChart Message (if you have MyChart) OR . A paper copy in the mail If you have any lab test that is abnormal or we need to change your treatment, we will call you to review the results.  Testing/Procedures: None ordered  Follow-Up: Your physician recommends that you schedule a follow-up appointment in: [place], PA-C [place] [address], [place], [place]  12/03/2017 2:14 PM    [facility] [address] [place], [place], [place]  [address] Phone: [phone]; Fax: [phone]

## 2017-12-03 NOTE — Patient Instructions (Addendum)
Medication Instructions:  Your physician has recommended you make the following change in your medication:  1.  STOP the Vascep 2.  START Nexium 40 mg daily 3.  STOP the Imdur   If you need a refill on your cardiac medications before your next appointment, please call your pharmacy.   Lab work: TODAY:  CBC & FECCULT OCCULT BLOOD CARD  If you have labs (blood work) drawn today and your tests are completely normal, you will receive your results only by: Marland Kitchen MyChart Message (if you have MyChart) OR . A paper copy in the mail If you have any lab test that is abnormal or we need to change your treatment, we will call you to review the results.  Testing/Procedures: None ordered  Follow-Up: Your physician recommends that you schedule a follow-up appointment in: VIN [place], PA-C IN 3 WEEKS

## 2017-12-03 NOTE — Addendum Note (Signed)
Addended by: Burnetta Sabin on: 12/03/2017 02:44 PM   Modules accepted: Orders

## 2017-12-04 ENCOUNTER — Telehealth: Payer: Self-pay | Admitting: Physician Assistant

## 2017-12-04 ENCOUNTER — Telehealth (HOSPITAL_COMMUNITY): Payer: Self-pay

## 2017-12-04 LAB — CBC
HEMOGLOBIN: 15.4 g/dL (ref 13.0–17.7)
Hematocrit: 45.3 % (ref 37.5–51.0)
MCH: 29 pg (ref 26.6–33.0)
MCHC: 34 g/dL (ref 31.5–35.7)
MCV: 85 fL (ref 79–97)
Platelets: 489 10*3/uL — ABNORMAL HIGH (ref 150–450)
RBC: 5.31 x10E6/uL (ref 4.14–5.80)
RDW: 13.7 % (ref 12.3–15.4)
WBC: 12.5 10*3/uL — ABNORMAL HIGH (ref 3.4–10.8)

## 2017-12-04 NOTE — Telephone Encounter (Signed)
New message   Patient's wife is calling for the patient's lab results.

## 2017-12-04 NOTE — Telephone Encounter (Signed)
Returned call to pt.  He has been made aware that he doesn't have anyone on his chart for [place] to talk re: any results. He is aware that his lab results aren't in yet, but when we receive them, we would call him. He thanked me for the call.

## 2017-12-04 NOTE — Telephone Encounter (Signed)
Called and spoke with pt in regards to Cardiac Rehab - Scheduled orientation on 01/31/18 at 8:30am. Pt will attend the 8:15am exc class. Mailed packet.

## 2017-12-14 ENCOUNTER — Other Ambulatory Visit: Payer: Self-pay | Admitting: Thoracic Surgery (Cardiothoracic Vascular Surgery)

## 2017-12-14 DIAGNOSIS — Z951 Presence of aortocoronary bypass graft: Secondary | ICD-10-CM

## 2017-12-17 ENCOUNTER — Ambulatory Visit
Admission: RE | Admit: 2017-12-17 | Discharge: 2017-12-17 | Disposition: A | Discharge status: Home / Self Care | Payer: BLUE CROSS/BLUE SHIELD | Source: Ambulatory Visit | Attending: Thoracic Surgery (Cardiothoracic Vascular Surgery) | Admitting: Thoracic Surgery (Cardiothoracic Vascular Surgery)

## 2017-12-17 ENCOUNTER — Ambulatory Visit: Payer: Self-pay | Admitting: Physician Assistant

## 2017-12-17 VITALS — BP 107/72 | HR 90 | Resp 20 | Ht 70.0 in | Wt 230.0 lb

## 2017-12-17 DIAGNOSIS — Z951 Presence of aortocoronary bypass graft: Secondary | ICD-10-CM

## 2017-12-17 DIAGNOSIS — J90 Pleural effusion, not elsewhere classified: Secondary | ICD-10-CM | POA: Diagnosis not present

## 2017-12-17 MED ORDER — CEPHALEXIN 500 MG PO CAPS: 500.0000 mg | ORAL_CAPSULE | Freq: Three times a day (TID) | ORAL | 0 refills | Status: DC

## 2017-12-17 MED ORDER — ALPRAZOLAM 0.5 MG PO TABS: 0.5000 mg | ORAL_TABLET | Freq: Every evening | ORAL | 0 refills | Status: DC | PRN

## 2017-12-17 NOTE — Progress Notes (Signed)
HPI:  Patient returns for routine postoperative follow-up having undergone CABG x 3 on 11/16/2017. The patient's early postoperative recovery while in the hospital was notable for difficult placing foley due to known previous urologic issues.  His foley catheter has since been removed.  Since hospital discharge the patient reports he is doing well.  He has already returned to work.  He has resumed driving.  He has signed up for cardiac rehab, but doesn't know if he will go as its $60 each time he attends.  He is ambulating without difficulty.  He states his chest incision has done well.  However, his radial artery site isn't healing well.  He states at one point there was pus coming out of the middle portion of the incision.   There is also a lump along the top portion which has had minimal purulent drainage.  He has not experienced any fevers.  FInally he has a lot of gas and belching.   Current Outpatient Medications  Medication Sig Dispense Refill  . ALPRAZolam (XANAX) 0.5 MG tablet Take 1 tablet (0.5 mg total) by mouth at bedtime as needed for anxiety. 30 tablet 0  . aspirin EC 325 MG EC tablet Take 1 tablet (325 mg total) by mouth daily.    Marland Kitchen escitalopram (LEXAPRO) 10 MG tablet Take 10 mg by mouth daily.  3  . esomeprazole (NEXIUM) 40 MG capsule Take 1 capsule (40 mg total) by mouth daily at 12 noon. 90 capsule 3  . Icosapent Ethyl (VASCEPA) 1 g CAPS Take 4 g by mouth daily.    Marland Kitchen JARDIANCE 25 MG TABS tablet Take 25 mg by mouth daily.  6  . metFORMIN (GLUCOPHAGE) 500 MG tablet Take 1 tablet (500 mg total) 2 (two) times daily by mouth. 60 tablet 1  . metoprolol tartrate (LOPRESSOR) 50 MG tablet Take 1 tablet (50 mg total) by mouth 2 (two) times daily. 60 tablet 0  . rosuvastatin (CRESTOR) 10 MG tablet Take 1 tablet (10 mg total) by mouth daily at 6 PM. 30 tablet 1  . cephALEXin (KEFLEX) 500 MG capsule Take 1 capsule (500 mg total) by mouth 3 (three) times daily. 21 capsule 0   No current  facility-administered medications for this visit.     Physical Exam:  BP 107/72   Pulse 90   Resp 20   Ht 5\' 10"  (1.778 m)   Wt 230 lb (104.3 kg)   SpO2 98% Comment: RA  BMI 33.00 kg/m   Gen: no apparent distress Heart: RRR Lungs: CTA bilaterally Abd: soft non-tender, non-distended Ext: no edema Incisions: sternotomy well healed, left radial artery harvest site is scabbed with areas of erythema and some purulence is noted  Diagnostic Tests:  CXR: sternal wires intact, minimal left pleural effusion  A/P;  1. S/P CABG x 3- okay to stop Imdur for previous radial artery, BP is in low 100s,- continue Lopressor at current regimen 2. Non-healing radial artery harvest site- wound does not appear to be grossly infected.  There were some areas of mild purulence and erythema.Jovita Gamma patient instructions to care for the incision properly, he was also given a week of Keflex as he is diabetic and could develop an infection quite quickly 3. Activity- patient has been non-compliant in regards to activity level since hospital discharge.  I again explained the importance of allowing sternotomy proper time to heal as if he does not it can create problems in the future 4. RTC in 1 week for wound  check if wound has not healed  Lowella DandyErin Shaunika Italiano, PA-C [facility] [phone]

## 2017-12-18 ENCOUNTER — Ambulatory Visit: Payer: BLUE CROSS/BLUE SHIELD | Admitting: Thoracic Surgery (Cardiothoracic Vascular Surgery)

## 2017-12-24 ENCOUNTER — Ambulatory Visit: Payer: Self-pay

## 2017-12-24 NOTE — Progress Notes (Deleted)
Cardiology Office Note   Date:  12/24/2017   ID:  Andre Ford, DOB December 13, 1980, MRN [id number]  PCP:  Adrian Prince, MD  Cardiologist: Dr. Tonny Bollman, MD   No chief complaint on file.     History of Present Illness: Andre Ford is a 37 y.o. male who presents for follow up, seen for Dr. Excell Seltzer.  Andre Ford ahs a hx of type 2 DM, former tobacco use, chronic pancreatitis and recent CAD s/p CABG who was last seen in follow up 12/03/17 after CABG.   He was initially admitted 11/13/17 for progressive angina. He was noted to have severe ischemia in the inferior and lateral walls on nuclear stress testing and underwent diagnostic catheterization by Dr Rosemary Holms demonstrating severe 3- vessel CAD. He requested to follow with Dr. Excell Seltzer since he have cared several of patient's family members and friends. Underwent CABG x (left internal mammary artery to left anterior descending, free right internal mammary artery to the obtuse marginal, left radial artery to posterior descending). Discharged on foley due to urethral stricture.   He was then seen in follow up on 12/03/17 and had complaints of ongoing nausea since his discharge. He denied constipation or diarrhea, but continued to have low appetite but reported that it was improving. He had reports of dark stools and therefore his Vascepa was held as this can cause nausea and his stool was checked as well.   Today he he states that he has been doing well.   Past Medical History:  Diagnosis Date  . Anxiety   . Arthritis    "qwhere" (11/14/2017)  . At risk for sleep apnea    STOP-BANG= 6     SENT TO PCP 08-05-2014  . Chronic pancreatitis (HCC)   . Coronary artery disease   . Depression   . High triglycerides   . History of kidney stones   . Hyperlipidemia   . Hypertension    "lost wright and this went away" (11/14/2017)  . Obesity   . Type II diabetes mellitus (HCC)    "dx'd 08/2017" (11/14/2017)  . Urethral stricture      Past Surgical History:  Procedure Laterality Date  . APPENDECTOMY  age 43  . CARDIAC CATHETERIZATION    . CORONARY ARTERY BYPASS GRAFT N/A 11/16/2017   Procedure: CORONARY ARTERY BYPASS GRAFTING (CABG) x 3.;  Surgeon: Loreli Slot, MD;  Location: [facility] OR;  Service: Open Heart Surgery;  Laterality: N/A;  BILATERAL IMAS  . CYSTOSCOPY WITH URETHRAL DILATATION N/A 08/10/2014   Procedure: CYSTOSCOPY WITH URETHRAL DILATATION;  Surgeon: Malen Gauze, MD;  Location: [facility];  Service: Urology;  Laterality: N/A;  . CYSTOSCOPY WITH URETHRAL DILATATION N/A 11/16/2017   Procedure: CYSTOSCOPY WITH URETHRAL DILATATION;  Surgeon: Loreli Slot, MD;  Location: [facility] OR;  Service: Open Heart Surgery;  Laterality: N/A;  performed by Dr. Ronne Binning  . FRACTURE SURGERY    . KNEE ARTHROSCOPY WITH ANTERIOR CRUCIATE LIGAMENT (ACL) REPAIR Left 2000  . LEFT HEART CATH AND CORONARY ANGIOGRAPHY N/A 11/12/2017   Procedure: LEFT HEART CATH AND CORONARY ANGIOGRAPHY;  Surgeon: Elder Negus, MD;  Location: [facility] INVASIVE CV LAB;  Service: Cardiovascular;  Laterality: N/A;  . OPEN REDUCTION INTERNAL FIXATION (ORIF) HAND Right 2000   "boxer fx"  . PENILE BIOPSY N/A 08/10/2014   Procedure: Core biopsy of penile glands lesion;  Surgeon: Malen Gauze, MD;  Location: [facility];  Service: Urology;  Laterality: N/A;  .  RADIAL ARTERY HARVEST Left 11/16/2017   Procedure: RADIAL ARTERY HARVEST;  Surgeon: Loreli Slot, MD;  Location: [facility] OR;  Service: Open Heart Surgery;  Laterality: Left;  . TEE WITHOUT CARDIOVERSION N/A 11/16/2017   Procedure: TRANSESOPHAGEAL ECHOCARDIOGRAM (TEE);  Surgeon: Loreli Slot, MD;  Location: [facility] OR;  Service: Open Heart Surgery;  Laterality: N/A;  . URETHROPLASTY  2016     Current Outpatient Medications  Medication Sig Dispense Refill  . ALPRAZolam (XANAX) 0.5 MG tablet Take 1 tablet (0.5 mg total) by mouth at bedtime as  needed for anxiety. 30 tablet 0  . aspirin EC 325 MG EC tablet Take 1 tablet (325 mg total) by mouth daily.    . cephALEXin (KEFLEX) 500 MG capsule Take 1 capsule (500 mg total) by mouth 3 (three) times daily. 21 capsule 0  . escitalopram (LEXAPRO) 10 MG tablet Take 10 mg by mouth daily.  3  . esomeprazole (NEXIUM) 40 MG capsule Take 1 capsule (40 mg total) by mouth daily at 12 noon. 90 capsule 3  . Icosapent Ethyl (VASCEPA) 1 g CAPS Take 4 g by mouth daily.    Marland Kitchen JARDIANCE 25 MG TABS tablet Take 25 mg by mouth daily.  6  . metFORMIN (GLUCOPHAGE) 500 MG tablet Take 1 tablet (500 mg total) 2 (two) times daily by mouth. 60 tablet 1  . metoprolol tartrate (LOPRESSOR) 50 MG tablet Take 1 tablet (50 mg total) by mouth 2 (two) times daily. 60 tablet 0  . rosuvastatin (CRESTOR) 10 MG tablet Take 1 tablet (10 mg total) by mouth daily at 6 PM. 30 tablet 1   No current facility-administered medications for this visit.     Allergies:   Patient has no known allergies.    Social History:  The patient  reports that he quit smoking about 6 years ago. His smoking use included cigarettes. He has a 10.00 pack-year smoking history. He has quit using smokeless tobacco.  His smokeless tobacco use included snuff. He reports that he drinks alcohol. He reports that he has current or past drug history.   Family History:  The patient's family history is not on file.   ROS:  Please see the history of present illness. Otherwise, review of systems are positive for none. All other systems are reviewed and negative.   PHYSICAL EXAM: VS:  There were no vitals taken for this visit. , BMI There is no height or weight on file to calculate BMI.   General: Well developed, well nourished, NAD Skin: Warm, dry, intact  Head: Normocephalic, atraumatic, clear, moist mucus membranes. Neck: Negative for carotid bruits. No JVD Lungs:Clear to ausculation bilaterally. No wheezes, rales, or rhonchi. Breathing is  unlabored. Cardiovascular: RRR with S1 S2. No murmurs, rubs, gallops, or LV heave appreciated. Abdomen: Soft, non-tender, non-distended with normoactive bowel sounds. No obvious abdominal masses. MSK: Strength and tone appear normal for age. 5/5 in all extremities Extremities: No edema. No clubbing or cyanosis. DP/PT pulses 2+ bilaterally Neuro: Alert and oriented. No focal deficits. No facial asymmetry. MAE spontaneously. Psych: Responds to questions appropriately with normal affect.     EKG:  EKG {ACTION; IS/IS MVH:[id number]} ordered today. The ekg ordered today demonstrates ***  Recent Labs: 01/24/2017: ALT 30 11/17/2017: Magnesium 1.9 11/20/2017: BUN 13; Creatinine, Ser 0.79; Potassium 4.5; Sodium 140 12/03/2017: Hemoglobin 15.4; Platelets 489   Lipid Panel    Component Value Date/Time   CHOL 232 (H) 10/13/2014 0500   TRIG 837 (H) 10/13/2014 0500  HDL 26 (L) 10/13/2014 0500   CHOLHDL 8.9 10/13/2014 0500   VLDL UNABLE TO CALCULATE IF TRIGLYCERIDE OVER 400 mg/dL 16/10/960409/13/2016 [id number]   LDLCALC UNABLE TO CALCULATE IF TRIGLYCERIDE OVER 400 mg/dL 81/19/147809/13/2016 29560500     Wt Readings from Last 3 Encounters:  12/17/17 230 lb (104.3 kg)  12/03/17 228 lb 1.9 oz (103.5 kg)  11/21/17 247 lb (112 kg)     Other studies Reviewed: Additional studies/ records that were reviewed today include:  Echocardiogram: 11/12/17 Study Conclusions  - Left ventricle: The cavity size was normal. Wall thickness was normal. Systolic function was normal. The estimated ejection fraction was in the range of 55% to 60%. Wall motion was normal; there were no regional wall motion abnormalities. Left ventricular diastolic function parameters were normal. - No significant valvular abnormality. - Inadequate TR jet to estimate PASP  LEFT HEART CATH AND CORONARY ANGIOGRAPHY 11/12/17  Conclusion   LM: Distal calcified 40% stenosis LAD: Prox calcified 40% stenosis Mid 95% stenosis LCx: Mid long  95% stenosis RCA: Prox 95%, mid to distal long diffuse 75% stenosis  LVEDP normal LVEDP 55-60%  Recommendation: Admit to telemetry Continue aspirin/heparin/statin CVTS consult for CABG in view of uncontrolled DM, hypertriglyceridemia and severe multivessel CAD   Diagnostic Diagram       ASSESSMENT AND PLAN:  1. CAD s/p recent CABG 10/2017 : -Continue ASA, BB, statin -Denies anginal symptoms -Continues to increase activity  -Appetite improving   2. Melena/nausea/low appetite: -HB improved from hospital admission, 15.4 on 12/03/2017 -Denies recurrent dark stools -Encouraged to continue to monitor given ongoing ASA use    3. DM2: -Followed by PCP -Continue Metformin and Jardiance   4. Hypotension: -BP at last office visit in which his IMDUR was held  -Encouraged to increase PO intake  -Lopressor continued in the setting of CAD and recent CABG -BP today    Current medicines are reviewed at length with the patient today.  The patient {ACTIONS; HAS/DOES NOT HAVE:19233} concerns regarding medicines.  The following changes have been made:  {PLAN; NO CHANGE:13088:s}  Labs/ tests ordered today include: *** No orders of the defined types were placed in this encounter.   Disposition:   FU with *** in {gen number 2-13:086578}0-10:310397} {Days to years:10300}  Signed, Georgie ChardJill Ziare Cryder, NP  12/24/2017 11:38 AM    [facility] [address], [place], [place]  [id number] Phone: [phone]; Fax: [phone]

## 2017-12-25 ENCOUNTER — Ambulatory Visit: Payer: BLUE CROSS/BLUE SHIELD | Admitting: Cardiology

## 2017-12-25 ENCOUNTER — Encounter: Payer: Self-pay | Admitting: Cardiology

## 2017-12-25 ENCOUNTER — Other Ambulatory Visit: Payer: Self-pay | Admitting: Cardiovascular Disease

## 2017-12-25 NOTE — Telephone Encounter (Signed)
Pt pharmacy is requesting refills on Rx Metoprolol 50 mg and Rosuvastatin 10 mg, which [facility] Please address. Thank you.

## 2018-01-07 ENCOUNTER — Telehealth (HOSPITAL_COMMUNITY): Payer: Self-pay

## 2018-01-10 ENCOUNTER — Emergency Department (HOSPITAL_COMMUNITY): Payer: BLUE CROSS/BLUE SHIELD

## 2018-01-10 ENCOUNTER — Observation Stay (HOSPITAL_COMMUNITY)
Admission: EM | Admit: 2018-01-10 | Discharge: 2018-01-11 | Disposition: A | Discharge status: Home / Self Care | Payer: BLUE CROSS/BLUE SHIELD | Attending: Cardiovascular Disease | Admitting: Cardiovascular Disease

## 2018-01-10 ENCOUNTER — Telehealth: Payer: Self-pay | Admitting: Cardiovascular Disease

## 2018-01-10 DIAGNOSIS — E1165 Type 2 diabetes mellitus with hyperglycemia: Secondary | ICD-10-CM

## 2018-01-10 DIAGNOSIS — I10 Essential (primary) hypertension: Secondary | ICD-10-CM | POA: Insufficient documentation

## 2018-01-10 DIAGNOSIS — I2510 Atherosclerotic heart disease of native coronary artery without angina pectoris: Secondary | ICD-10-CM | POA: Insufficient documentation

## 2018-01-10 DIAGNOSIS — E119 Type 2 diabetes mellitus without complications: Secondary | ICD-10-CM | POA: Diagnosis not present

## 2018-01-10 DIAGNOSIS — Z951 Presence of aortocoronary bypass graft: Secondary | ICD-10-CM | POA: Insufficient documentation

## 2018-01-10 DIAGNOSIS — Z79899 Other long term (current) drug therapy: Secondary | ICD-10-CM | POA: Insufficient documentation

## 2018-01-10 DIAGNOSIS — Z7984 Long term (current) use of oral hypoglycemic drugs: Secondary | ICD-10-CM | POA: Diagnosis not present

## 2018-01-10 DIAGNOSIS — R079 Chest pain, unspecified: Secondary | ICD-10-CM

## 2018-01-10 DIAGNOSIS — R0789 Other chest pain: Principal | ICD-10-CM | POA: Insufficient documentation

## 2018-01-10 DIAGNOSIS — Z87891 Personal history of nicotine dependence: Secondary | ICD-10-CM | POA: Diagnosis not present

## 2018-01-10 DIAGNOSIS — R0781 Pleurodynia: Secondary | ICD-10-CM | POA: Diagnosis present

## 2018-01-10 HISTORY — DX: Pleurodynia: R07.81

## 2018-01-10 LAB — SEDIMENTATION RATE: Sed Rate: 2 mm/hr (ref 0–16)

## 2018-01-10 LAB — HEPATIC FUNCTION PANEL
ALBUMIN: 4.2 g/dL (ref 3.5–5.0)
ALT: 27 U/L (ref 0–44)
AST: 17 U/L (ref 15–41)
Alkaline Phosphatase: 85 U/L (ref 38–126)
Bilirubin, Direct: 0.2 mg/dL (ref 0.0–0.2)
Indirect Bilirubin: 1.2 mg/dL — ABNORMAL HIGH (ref 0.3–0.9)
Total Bilirubin: 1.4 mg/dL — ABNORMAL HIGH (ref 0.3–1.2)
Total Protein: 7.2 g/dL (ref 6.5–8.1)

## 2018-01-10 LAB — BASIC METABOLIC PANEL
Anion gap: 12 (ref 5–15)
BUN: 16 mg/dL (ref 6–20)
CO2: 21 mmol/L — ABNORMAL LOW (ref 22–32)
Calcium: 9.4 mg/dL (ref 8.9–10.3)
Chloride: 105 mmol/L (ref 98–111)
Creatinine, Ser: 0.81 mg/dL (ref 0.61–1.24)
GFR calc Af Amer: >60 mL/min (ref >60)
GFR calc non Af Amer: >60 mL/min (ref >60)
Glucose, Bld: 109 mg/dL — ABNORMAL HIGH (ref 70–99)
Potassium: 4.2 mmol/L (ref 3.5–5.1)
SODIUM: 138 mmol/L (ref 135–145)

## 2018-01-10 LAB — CBC
HCT: 46.6 % (ref 39.0–52.0)
Hemoglobin: 15.2 g/dL (ref 13.0–17.0)
MCH: 27 pg (ref 26.0–34.0)
MCHC: 32.6 g/dL (ref 30.0–36.0)
MCV: 82.8 fL (ref 80.0–100.0)
PLATELETS: 178 10*3/uL (ref 150–400)
RBC: 5.63 MIL/uL (ref 4.22–5.81)
RDW: 13.3 % (ref 11.5–15.5)
WBC: 8.5 10*3/uL (ref 4.0–10.5)
nRBC: 0 % (ref 0.0–0.2)

## 2018-01-10 LAB — LIPASE, BLOOD: Lipase: 39 U/L (ref 11–51)

## 2018-01-10 LAB — D-DIMER, QUANTITATIVE: D-Dimer, Quant: 0.32 ug/mL-FEU (ref 0.00–0.50)

## 2018-01-10 LAB — C-REACTIVE PROTEIN: CRP: 5.6 mg/dL — ABNORMAL HIGH (ref <1.0)

## 2018-01-10 LAB — TROPONIN I
Troponin I: <0.03 ng/mL (ref <0.03)
Troponin I: <0.03 ng/mL (ref <0.03)

## 2018-01-10 LAB — I-STAT TROPONIN, ED: TROPONIN I, POC: 0 ng/mL (ref 0.00–0.08)

## 2018-01-10 MED ORDER — ESCITALOPRAM OXALATE 10 MG PO TABS
10.0000 mg | ORAL_TABLET | Freq: Every day | ORAL | Status: DC
  Administered 2018-01-11: 10 mg via ORAL
  Filled 2018-01-10: qty 1

## 2018-01-10 MED ORDER — ACETAMINOPHEN 325 MG PO TABS: 650.0000 mg | ORAL_TABLET | ORAL | Status: DC | PRN

## 2018-01-10 MED ORDER — METFORMIN HCL 500 MG PO TABS
500.0000 mg | ORAL_TABLET | Freq: Two times a day (BID) | ORAL | Status: DC
  Administered 2018-01-11: 500 mg via ORAL
  Filled 2018-01-10: qty 1

## 2018-01-10 MED ORDER — KETOROLAC TROMETHAMINE 30 MG/ML IJ SOLN: 30.0000 mg | Freq: Four times a day (QID) | INTRAMUSCULAR | Status: DC

## 2018-01-10 MED ORDER — ASPIRIN EC 325 MG PO TBEC: 325.0000 mg | DELAYED_RELEASE_TABLET | Freq: Every day | ORAL | Status: DC

## 2018-01-10 MED ORDER — ONDANSETRON HCL 4 MG/2ML IJ SOLN: 4.0000 mg | Freq: Four times a day (QID) | INTRAMUSCULAR | Status: DC | PRN

## 2018-01-10 MED ORDER — PANTOPRAZOLE SODIUM 40 MG PO TBEC
40.0000 mg | DELAYED_RELEASE_TABLET | Freq: Every day | ORAL | Status: DC
  Administered 2018-01-11: 40 mg via ORAL
  Filled 2018-01-10: qty 1

## 2018-01-10 MED ORDER — ASPIRIN EC 325 MG PO TBEC
325.0000 mg | DELAYED_RELEASE_TABLET | Freq: Every day | ORAL | Status: DC
  Administered 2018-01-11: 325 mg via ORAL
  Filled 2018-01-10: qty 1

## 2018-01-10 MED ORDER — ASPIRIN 81 MG PO CHEW
324.0000 mg | CHEWABLE_TABLET | Freq: Once | ORAL | Status: AC
  Administered 2018-01-10: 324 mg via ORAL
  Filled 2018-01-10: qty 4

## 2018-01-10 MED ORDER — KETOROLAC TROMETHAMINE 30 MG/ML IJ SOLN
30.0000 mg | Freq: Four times a day (QID) | INTRAMUSCULAR | Status: DC
  Administered 2018-01-10 – 2018-01-11 (×2): 30 mg via INTRAVENOUS
  Filled 2018-01-10 (×2): qty 1

## 2018-01-10 MED ORDER — ROSUVASTATIN CALCIUM 5 MG PO TABS
10.0000 mg | ORAL_TABLET | Freq: Every day | ORAL | Status: DC
  Administered 2018-01-11: 10 mg via ORAL
  Filled 2018-01-10: qty 2

## 2018-01-10 MED ORDER — ALPRAZOLAM 0.5 MG PO TABS
0.5000 mg | ORAL_TABLET | Freq: Every evening | ORAL | Status: DC | PRN
  Administered 2018-01-10: 0.5 mg via ORAL
  Filled 2018-01-10: qty 1

## 2018-01-10 MED ORDER — METOPROLOL TARTRATE 25 MG PO TABS
50.0000 mg | ORAL_TABLET | Freq: Two times a day (BID) | ORAL | Status: DC
  Administered 2018-01-10 – 2018-01-11 (×2): 50 mg via ORAL
  Filled 2018-01-10 (×2): qty 2

## 2018-01-10 MED ORDER — KETOROLAC TROMETHAMINE 15 MG/ML IJ SOLN
15.0000 mg | Freq: Once | INTRAMUSCULAR | Status: AC
  Administered 2018-01-10: 15 mg via INTRAVENOUS
  Filled 2018-01-10: qty 1

## 2018-01-10 MED ORDER — ICOSAPENT ETHYL 1 G PO CAPS: 4.0000 g | ORAL_CAPSULE | Freq: Every day | ORAL | Status: DC

## 2018-01-10 NOTE — Telephone Encounter (Signed)
Pt calling today with c/o left sided chest pain with left shoulder/neck pain. Pt was advised to go to the ED asap for evaluation. Pt was in a vehicle on his way to the ED during the call. ED triage nurse was notified and will address him when he arrives.

## 2018-01-10 NOTE — Telephone Encounter (Signed)
New message    Pt said he's about 4-5 weeks out from having heart surgery.  Pt c/o of Chest Pain: STAT if CP now or developed within 24 hours  1. Are you having CP right now? Yes. Pain in chest on the L side and neck and shoulder  2. Are you experiencing any other symptoms (ex. SOB, nausea, vomiting, sweating)? no  3. How long have you been experiencing CP? 30 minutes  4. Is your CP continuous or coming and going? Continuous and getting worse  5. Have you taken Nitroglycerin? No pt does not have  ?

## 2018-01-10 NOTE — ED Notes (Signed)
Paged cards per MD  

## 2018-01-10 NOTE — ED Triage Notes (Signed)
Pt endorses left sided chest pain with radiation to left neck and shoulder. Post CABG x 3 that was 6 weeks ago. Axox4.

## 2018-01-10 NOTE — ED Provider Notes (Addendum)
[facility] EMERGENCY DEPARTMENT Provider Note   CSN: [id number] Arrival date & time: 01/10/18  1608     History   Chief Complaint Chief Complaint  Patient presents with  . Chest Pain    HPI Andre Ford is a 37 y.o. male.  The history is provided by the patient.  Chest Pain   This is a new problem. The current episode started less than 1 hour ago. The problem occurs constantly. The problem has not changed since onset.The pain is associated with rest. The pain is present in the substernal region. The pain is at a severity of 8/10. The pain is severe. The quality of the pain is described as pressure-like. The pain radiates to the left neck and left shoulder. The symptoms are aggravated by deep breathing. Pertinent negatives include no abdominal pain, no back pain, no claudication, no cough, no diaphoresis, no fever, no headaches, no hemoptysis, no leg pain, no lower extremity edema, no malaise/fatigue, no nausea, no near-syncope, no orthopnea, no palpitations, no PND, no shortness of breath, no sputum production, no syncope, no vomiting and no weakness. He has tried nothing for the symptoms. The treatment provided no relief.  His past medical history is significant for CAD (CABG 6 weeks ago).  Pertinent negatives for past medical history include no seizures.  His family medical history is significant for CAD.    Past Medical History:  Diagnosis Date  . Anxiety   . Arthritis    "qwhere" (11/14/2017)  . At risk for sleep apnea    STOP-BANG= 6     SENT TO PCP 08-05-2014  . Chronic pancreatitis (HCC)   . Coronary artery disease   . Depression   . High triglycerides   . History of kidney stones   . Hyperlipidemia   . Hypertension    "lost wright and this went away" (11/14/2017)  . Obesity   . Type II diabetes mellitus (HCC)    "dx'd 08/2017" (11/14/2017)  . Urethral stricture     Patient Active Problem List   Diagnosis Date Noted  . Chest pain,  atypical 01/10/2018  . S/P CABG x 3 11/16/2017  . Coronary artery disease 11/16/2017  . Angina at rest [facility]) 11/12/2017  . Hypercholesterolemia with hypertriglyceridemia 10/14/2014  . Type 2 diabetes mellitus with hyperglycemia (HCC) 10/14/2014  . Morbid obesity (HCC) 10/14/2014  . Acute pancreatitis   . Pancreatitis 10/12/2014  . Hepatic steatosis 10/12/2014  . Alcohol abuse, episodic 10/12/2014  . Obesity 10/12/2014    Past Surgical History:  Procedure Laterality Date  . APPENDECTOMY  age 73  . CARDIAC CATHETERIZATION    . CORONARY ARTERY BYPASS GRAFT N/A 11/16/2017   Procedure: CORONARY ARTERY BYPASS GRAFTING (CABG) x 3.;  Surgeon: Loreli Slot, MD;  Location: [facility] OR;  Service: Open Heart Surgery;  Laterality: N/A;  BILATERAL IMAS  . CYSTOSCOPY WITH URETHRAL DILATATION N/A 08/10/2014   Procedure: CYSTOSCOPY WITH URETHRAL DILATATION;  Surgeon: Malen Gauze, MD;  Location: [facility];  Service: Urology;  Laterality: N/A;  . CYSTOSCOPY WITH URETHRAL DILATATION N/A 11/16/2017   Procedure: CYSTOSCOPY WITH URETHRAL DILATATION;  Surgeon: Loreli Slot, MD;  Location: [facility] OR;  Service: Open Heart Surgery;  Laterality: N/A;  performed by Dr. Ronne Binning  . FRACTURE SURGERY    . KNEE ARTHROSCOPY WITH ANTERIOR CRUCIATE LIGAMENT (ACL) REPAIR Left 2000  . LEFT HEART CATH AND CORONARY ANGIOGRAPHY N/A 11/12/2017   Procedure: LEFT HEART CATH AND CORONARY ANGIOGRAPHY;  Surgeon: Elder Negus, MD;  Location: [facility] INVASIVE CV LAB;  Service: Cardiovascular;  Laterality: N/A;  . OPEN REDUCTION INTERNAL FIXATION (ORIF) HAND Right 2000   "boxer fx"  . PENILE BIOPSY N/A 08/10/2014   Procedure: Core biopsy of penile glands lesion;  Surgeon: Malen Gauze, MD;  Location: [facility];  Service: Urology;  Laterality: N/A;  . RADIAL ARTERY HARVEST Left 11/16/2017   Procedure: RADIAL ARTERY HARVEST;  Surgeon: Loreli Slot, MD;  Location: [facility] OR;   Service: Open Heart Surgery;  Laterality: Left;  . TEE WITHOUT CARDIOVERSION N/A 11/16/2017   Procedure: TRANSESOPHAGEAL ECHOCARDIOGRAM (TEE);  Surgeon: Loreli Slot, MD;  Location: [facility] OR;  Service: Open Heart Surgery;  Laterality: N/A;  . URETHROPLASTY  2016        Home Medications    Prior to Admission medications   Medication Sig Start Date End Date Taking? Authorizing Provider  ALPRAZolam Prudy Feeler) 0.5 MG tablet Take 1 tablet (0.5 mg total) by mouth at bedtime as needed for anxiety. 12/17/17  Yes Barrett, Rae Roam, PA-C  aspirin EC 325 MG EC tablet Take 1 tablet (325 mg total) by mouth daily. 11/21/17  Yes Gold, Wayne E, PA-C  escitalopram (LEXAPRO) 10 MG tablet Take 10 mg by mouth daily. 10/30/17  Yes [provider]  esomeprazole (NEXIUM) 40 MG capsule Take 1 capsule (40 mg total) by mouth daily at 12 noon. 12/03/17  Yes Bhagat, Bhavinkumar, PA  Icosapent Ethyl (VASCEPA) 1 g CAPS Take 4 g by mouth daily.   Yes [provider]  JARDIANCE 25 MG TABS tablet Take 25 mg by mouth daily. 11/04/17  Yes [provider]  metFORMIN (GLUCOPHAGE) 500 MG tablet Take 1 tablet (500 mg total) 2 (two) times daily by mouth. 12/14/16  Yes Ward, Baxter Hire N, DO  metoprolol tartrate (LOPRESSOR) 50 MG tablet Take 1 tablet (50 mg total) by mouth 2 (two) times daily. 12/25/17 12/20/18 Yes Bhagat, Bhavinkumar, PA  rosuvastatin (CRESTOR) 10 MG tablet Take 1 tablet (10 mg total) by mouth daily. 12/25/17 12/20/18 Yes Bhagat, Bhavinkumar, PA  cephALEXin (KEFLEX) 500 MG capsule Take 1 capsule (500 mg total) by mouth 3 (three) times daily. Patient not taking: Reported on 01/10/2018 12/17/17   Barrett, Rae Roam, PA-C    Family History Family History  Problem Relation Age of Onset  . Heart disease Neg Hx     Social History Social History   Tobacco Use  . Smoking status: Former Smoker    Packs/day: 1.00    Years: 10.00    Pack years: 10.00    Types: Cigarettes    Last attempt to  quit: 08/05/2011    Years since quitting: 6.4  . Smokeless tobacco: Former Neurosurgeon    Types: Snuff  Substance Use Topics  . Alcohol use: Yes    Comment: 11/14/2017 "nothing since pancreatitis dx'd in 2016"  . Drug use: Not Currently     Allergies   Patient has no known allergies.   Review of Systems Review of Systems  Constitutional: Negative for chills, diaphoresis, fever and malaise/fatigue.  HENT: Negative for ear pain and sore throat.   Eyes: Negative for pain and visual disturbance.  Respiratory: Negative for cough, hemoptysis, sputum production and shortness of breath.   Cardiovascular: Positive for chest pain. Negative for palpitations, orthopnea, claudication, syncope, PND and near-syncope.  Gastrointestinal: Negative for abdominal pain, nausea and vomiting.  Genitourinary: Negative for dysuria and hematuria.  Musculoskeletal: Negative for arthralgias and back pain.  Skin: Negative for color change and rash.  Neurological: Negative for seizures, syncope, weakness and headaches.  All other systems reviewed and are negative.    Physical Exam Updated Vital Signs  ED Triage Vitals  Enc Vitals Group     BP --      Pulse --      Resp --      Temp 01/10/18 1621 97.9 F (36.6 C)     Temp Source 01/10/18 1621 Oral     SpO2 --      Weight --      Height --      Head Circumference --      Peak Flow --      Pain Score 01/10/18 1610 7     Pain Loc --      Pain Edu? --      Excl. in GC? --     Physical Exam Vitals signs and nursing note reviewed.  Constitutional:      Appearance: He is well-developed.  HENT:     Head: Normocephalic and atraumatic.  Eyes:     Extraocular Movements: Extraocular movements intact.     Conjunctiva/sclera: Conjunctivae normal.     Pupils: Pupils are equal, round, and reactive to light.  Neck:     Musculoskeletal: Normal range of motion and neck supple.  Cardiovascular:     Rate and Rhythm: Normal rate and regular rhythm.     Pulses:           Radial pulses are 2+ on the right side and 2+ on the left side.       Dorsalis pedis pulses are 2+ on the right side and 2+ on the left side.     Heart sounds: Normal heart sounds. No murmur.  Pulmonary:     Effort: Pulmonary effort is normal. No respiratory distress.     Breath sounds: Normal breath sounds. No decreased breath sounds, wheezing, rhonchi or rales.  Chest:     Chest wall: No mass.  Abdominal:     General: Bowel sounds are normal.     Palpations: Abdomen is soft.     Tenderness: There is no abdominal tenderness.  Musculoskeletal: Normal range of motion.     Right lower leg: No edema.     Left lower leg: No edema.  Skin:    General: Skin is warm and dry.  Neurological:     Mental Status: He is alert.      ED Treatments / Results  Labs (all labs ordered are listed, but only abnormal results are displayed) Labs Reviewed  BASIC METABOLIC PANEL - Abnormal; Notable for the following components:      Result Value   CO2 21 (*)    Glucose, Bld 109 (*)    All other components within normal limits  HEPATIC FUNCTION PANEL - Abnormal; Notable for the following components:   Total Bilirubin 1.4 (*)    Indirect Bilirubin 1.2 (*)    All other components within normal limits  CBC  LIPASE, BLOOD  TROPONIN I  D-DIMER, QUANTITATIVE (NOT AT [facility])  HIV ANTIBODY (ROUTINE TESTING W REFLEX)  TROPONIN I  TROPONIN I  C-REACTIVE PROTEIN  SEDIMENTATION RATE  I-STAT TROPONIN, ED  CBG MONITORING, ED    EKG EKG Interpretation  Date/Time:  Thursday January 10 2018 16:16:32 EST Ventricular Rate:  89 PR Interval:    QRS Duration: 98 QT Interval:  338 QTC Calculation: 412 R Axis:   67 Text Interpretation:  Sinus  rhythm Probable inferior infarct, acute Lateral leads are also involved Baseline wander in lead(s) V5 Confirmed by Virgina NorfolkAdam, Aliza Moret [id number]) on 01/10/2018 4:40:48 PM   Radiology Dg Chest Portable 1 View  Result Date: 01/10/2018 CLINICAL DATA:  Chest pain and  shortness breath. EXAM: PORTABLE CHEST 1 VIEW COMPARISON:  12/17/2017 FINDINGS: Mild bandlike opacities left lung base favors atelectasis or scarring. The lungs appear otherwise clear. Prior CABG. Mildly low lung volumes. IMPRESSION: 1. Residual mild left basilar bandlike opacity favoring atelectasis or scarring. Electronically Signed   By: Gaylyn RongWalter  Liebkemann M.D.   On: 01/10/2018 16:35    Procedures Procedures (including critical care time)  Medications Ordered in ED Medications  acetaminophen (TYLENOL) tablet 650 mg (has no administration in time range)  ondansetron (ZOFRAN) injection 4 mg (has no administration in time range)  ALPRAZolam (XANAX) tablet 0.5 mg (has no administration in time range)  escitalopram (LEXAPRO) tablet 10 mg (has no administration in time range)  pantoprazole (PROTONIX) EC tablet 40 mg (has no administration in time range)  Icosapent Ethyl CAPS 4 g (has no administration in time range)  metFORMIN (GLUCOPHAGE) tablet 500 mg (has no administration in time range)  metoprolol tartrate (LOPRESSOR) tablet 50 mg (has no administration in time range)  rosuvastatin (CRESTOR) tablet 10 mg (has no administration in time range)  aspirin EC tablet 325 mg (has no administration in time range)  ketorolac (TORADOL) 30 MG/ML injection 30 mg (has no administration in time range)  aspirin chewable tablet 324 mg (324 mg Oral Given 01/10/18 1730)  ketorolac (TORADOL) 15 MG/ML injection 15 mg (15 mg Intravenous Given 01/10/18 1730)     Initial Impression / Assessment and Plan / ED Course  I have reviewed the triage vital signs and the nursing notes.  Pertinent labs & imaging results that were available during my care of the patient were reviewed by me and considered in my medical decision making (see chart for details).     Lavonia DraftsJohn T Weisgerber is a 37 year old male with history of coronary artery disease status post CABG 6 weeks ago who presents to the ED with chest pain.  Patient  with normal vitals.  No fever.  Chest pain started about 30 minutes prior to arrival.  EKG shows sinus rhythm.  Mild ST elevations in inferior lead that appear unchanged from prior.  Contacted cardiology on the phone and they agree that no need to activate the Cath Lab.  Patient with normal troponin.  Clear breath sounds on exam.  No signs of pneumonia, pneumothorax, pleural effusion.  No significant anemia, electrolyte abnormality, kidney injury.  Cardiology came down to the ED to evaluate the patient and will admit for further work-up.  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  Will give Toradol injection.  They will cycle troponins and hold off on catheterization at this time.  Patient remained hemodynamically stable throughout my care and admitted in stable condition.  This chart was dictated using voice recognition software.  Despite best efforts to proofread,  errors can occur which can change the documentation meaning.   Final Clinical Impressions(s) / ED Diagnoses   Final diagnoses:  Chest pain, unspecified type    ED Discharge Orders    None       Virgina NorfolkCuratolo, Lilyian Quayle, DO 01/10/18 1812    Virgina Norfolkuratolo, Malyna Budney, DO 01/10/18 1815

## 2018-01-10 NOTE — ED Notes (Signed)
Cardiology at bedside.

## 2018-01-10 NOTE — H&P (Addendum)
Cardiology Admission History and Physical:   Patient ID: Andre Ford MRN: [id number]; DOB: Nov 26, 1980   Admission date: 01/10/2018  Primary Care Provider: Reynold Bowen, MD Primary Cardiologist: Dr. Burt Knack Primary Electrophysiologist:  None  CT Surgeon: Dr. Roxan Hockey   Chief Complaint:  Chest and neck pain  Patient Profile:   Andre Ford is a 37 y.o. male with CAD s/p recent CABG x 3 and DM, presenting to the ED with acute onset chest and neck pain.  History of Present Illness:   Andre Ford is a 37 y/o male w/ T2DM recency found to have severe 3V CAD and underwent CABG x 3 in October of this year.  He got a left internal mammary artery to left anterior descending, free right internal mammary artery to the obtuse marginal and left radial artery to posterior descending. LVEF normal. He had been doing well post op with no recurrent angina. However earlier today, ~1 hr prior to coming to the ED, he developed sudden left lower chest pain and left lateral neck pain. He had no CP when he had his recent cath. He only had neck pain at that time.   His neck pain today feels a bit similar but also atypical. No exertional component. His pain is pleuritic. Hurts to take in a deep breath and also exacerbated in the supine position. Improved sitting up. He denies dyspnea, LEE, no recent prolonged travel. EKG shows some slight inferior ST abnormalities, but c/w prior EKGs. No acute changes.   Also no fever, chills, N/V or abdominal pain. He has had a recent stuffy/ runny nose but no cough. No reproducible pain with palpation of left chest wall or neck.    Past Medical History:  Diagnosis Date  . Anxiety   . Arthritis    "qwhere" (11/14/2017)  . At risk for sleep apnea    STOP-BANG= 6     SENT TO PCP 08-05-2014  . Chronic pancreatitis ([place])   . Coronary artery disease   . Depression   . High triglycerides   . History of kidney stones   . Hyperlipidemia   . Hypertension    "lost  wright and this went away" (11/14/2017)  . Obesity   . Type II diabetes mellitus ([place])    "dx'd 08/2017" (11/14/2017)  . Urethral stricture     Past Surgical History:  Procedure Laterality Date  . APPENDECTOMY  age 23  . CARDIAC CATHETERIZATION    . CORONARY ARTERY BYPASS GRAFT N/A 11/16/2017   Procedure: CORONARY ARTERY BYPASS GRAFTING (CABG) x 3.;  Surgeon: Melrose Nakayama, MD;  Location: [facility];  Service: Open Heart Surgery;  Laterality: N/A;  BILATERAL IMAS  . CYSTOSCOPY WITH URETHRAL DILATATION N/A 08/10/2014   Procedure: CYSTOSCOPY WITH URETHRAL DILATATION;  Surgeon: Cleon Gustin, MD;  [facility];  Service: Urology;  Laterality: N/A;  . CYSTOSCOPY WITH URETHRAL DILATATION N/A 11/16/2017   Procedure: CYSTOSCOPY WITH URETHRAL DILATATION;  Surgeon: Melrose Nakayama, MD;  Location: [facility];  Service: Open Heart Surgery;  Laterality: N/A;  performed by Dr. Alyson Ingles  . FRACTURE SURGERY    . KNEE ARTHROSCOPY WITH ANTERIOR CRUCIATE LIGAMENT (ACL) REPAIR Left 2000  . LEFT HEART CATH AND CORONARY ANGIOGRAPHY N/A 11/12/2017   Procedure: LEFT HEART CATH AND CORONARY ANGIOGRAPHY;  Surgeon: Nigel Mormon, MD;  Location: [facility];  Service: Cardiovascular;  Laterality: N/A;  . OPEN REDUCTION INTERNAL FIXATION (ORIF) HAND Right 2000   "boxer fx"  .  PENILE BIOPSY N/A 08/10/2014   Procedure: Core biopsy of penile glands lesion;  Surgeon: Cleon Gustin, MD;  Location[facility];  Service: Urology;  Laterality: N/A;  . RADIAL ARTERY HARVEST Left 11/16/2017   Procedure: RADIAL ARTERY HARVEST;  Surgeon: Melrose Nakayama, MD;  Location: [facility];  Service: Open Heart Surgery;  Laterality: Left;  . TEE WITHOUT CARDIOVERSION N/A 11/16/2017   Procedure: TRANSESOPHAGEAL ECHOCARDIOGRAM (TEE);  Surgeon: Melrose Nakayama, MD;  Location: [facility];  Service: Open Heart Surgery;  Laterality: N/A;  . URETHROPLASTY  2016      Medications Prior to Admission: Prior to Admission medications   Medication Sig Start Date End Date Taking? Authorizing Provider  ALPRAZolam Duanne Moron) 0.5 MG tablet Take 1 tablet (0.5 mg total) by mouth at bedtime as needed for anxiety. 12/17/17   Barrett, Lodema Hong, PA-C  aspirin EC 325 MG EC tablet Take 1 tablet (325 mg total) by mouth daily. 11/21/17   Gold, Wayne E, PA-C  cephALEXin (KEFLEX) 500 MG capsule Take 1 capsule (500 mg total) by mouth 3 (three) times daily. 12/17/17   Barrett, Erin R, PA-C  escitalopram (LEXAPRO) 10 MG tablet Take 10 mg by mouth daily. 10/30/17   [provider]  esomeprazole (NEXIUM) 40 MG capsule Take 1 capsule (40 mg total) by mouth daily at 12 noon. 12/03/17   Bhagat, Crista Luria, PA  Icosapent Ethyl (VASCEPA) 1 g CAPS Take 4 g by mouth daily.    [provider]  JARDIANCE 25 MG TABS tablet Take 25 mg by mouth daily. 11/04/17   [provider]  metFORMIN (GLUCOPHAGE) 500 MG tablet Take 1 tablet (500 mg total) 2 (two) times daily by mouth. 12/14/16   Ward, Delice Bison, DO  metoprolol tartrate (LOPRESSOR) 50 MG tablet Take 1 tablet (50 mg total) by mouth 2 (two) times daily. 12/25/17 12/20/18  Leanor Kail, PA  rosuvastatin (CRESTOR) 10 MG tablet Take 1 tablet (10 mg total) by mouth daily. 12/25/17 12/20/18  Leanor Kail, PA     Allergies:   No Known Allergies  Social History:   Social History   Socioeconomic History  . Marital status: Married    Spouse name: Not on file  . Number of children: Not on file  . Years of education: Not on file  . Highest education level: Not on file  Occupational History  . Not on file  Social Needs  . Financial resource strain: Not on file  . Food insecurity:    Worry: Not on file    Inability: Not on file  . Transportation needs:    Medical: Not on file    Non-medical: Not on file  Tobacco Use  . Smoking status: Former Smoker    Packs/day: 1.00    Years: 10.00    Pack years:  10.00    Types: Cigarettes    Last attempt to quit: 08/05/2011    Years since quitting: 6.4  . Smokeless tobacco: Former Systems developer    Types: Snuff  Substance and Sexual Activity  . Alcohol use: Yes    Comment: 11/14/2017 "nothing since pancreatitis dx'd in 2016"  . Drug use: Not Currently  . Sexual activity: Not on file  Lifestyle  . Physical activity:    Days per week: Not on file    Minutes per session: Not on file  . Stress: Not on file  Relationships  . Social connections:    Talks on phone: Not on file    Gets  together: Not on file    Attends religious service: Not on file    Active member of club or organization: Not on file    Attends meetings of clubs or organizations: Not on file    Relationship status: Not on file  . Intimate partner violence:    Fear of current or ex partner: Not on file    Emotionally abused: Not on file    Physically abused: Not on file    Forced sexual activity: Not on file  Other Topics Concern  . Not on file  Social History Narrative  . Not on file    Family History:   The patient's family history is negative for Heart disease.    ROS:  Please see the history of present illness.  All other ROS reviewed and negative.     Physical Exam/Data:   Vitals:   01/10/18 1621 01/10/18 1630 01/10/18 1645  BP:  (!) 128/99 (!) 127/100  Pulse:  91 92  Resp:  14 16  Temp: 97.9 F (36.6 C)    TempSrc: Oral    SpO2:  97% 98%   No intake or output data in the 24 hours ending 01/10/18 1700 There were no vitals filed for this visit. There is no height or weight on file to calculate BMI.  General:  Well nourished, well developed, in no acute distress HEENT: normal Lymph: no adenopathy Neck: no JVD Endocrine:  No thryomegaly Vascular: No carotid bruits; FA pulses 2+ bilaterally without bruits  Cardiac:  normal S1, S2; RRR; no murmur  Lungs:  clear to auscultation bilaterally, no wheezing, rhonchi or rales  Abd: soft, nontender, no hepatomegaly  Ext:  noedema Musculoskeletal:  No deformities, BUE and BLE strength normal and equal Skin: warm and dry  Neuro:  CNs 2-12 intact, no focal abnormalities noted Psych:  Normal affect    EKG:  The ECG that was done 01/10/18 was personally reviewed and demonstrates EKG shows some slight inferior ST abnormalities, but c/w prior EKGs. No acute changes  Relevant CV Studies: Echocardiogram: 11/12/17 Study Conclusions  - Left ventricle: The cavity size was normal. Wall thickness was normal. Systolic function was normal. The estimated ejection fraction was in the range of 55% to 60%. Wall motion was normal; there were no regional wall motion abnormalities. Left ventricular diastolic function parameters were normal. - No significant valvular abnormality. - Inadequate TR jet to estimate PASP  LEFT HEART CATH AND CORONARY ANGIOGRAPHY 11/12/17  Conclusion   LM: Distal calcified 40% stenosis LAD: Prox calcified 40% stenosis Mid 95% stenosis LCx: Mid long 95% stenosis RCA: Prox 95%, mid to distal long diffuse 75% stenosis  LVEDP normal LVEDP 55-60%  Recommendation: Admit to telemetry Continue aspirin/heparin/statin CVTS consult for CABG in view of uncontrolled DM, hypertriglyceridemia and severe multivessel CAD   Diagnostic Diagram        Laboratory Data:  Chemistry Recent Labs  Lab 01/10/18 1630  NA 138  K 4.2  CL 105  CO2 21*  GLUCOSE 109*  BUN 16  CREATININE 0.81  CALCIUM 9.4  GFRNONAA >60  GFRAA >60  ANIONGAP 12    No results for input(s): PROT, ALBUMIN, AST, ALT, ALKPHOS, BILITOT in the last 168 hours. Hematology Recent Labs  Lab 01/10/18 1630  WBC 8.5  RBC 5.63  HGB 15.2  HCT 46.6  MCV 82.8  MCH 27.0  MCHC 32.6  RDW 13.3  PLT 178   Cardiac EnzymesNo results for input(s): TROPONINI in the last 168 hours.  Recent Labs  Lab 01/10/18 1633  TROPIPOC 0.00    BNPNo results for input(s): BNP, PROBNP in the last 168 hours.  DDimer No  results for input(s): DDIMER in the last 168 hours.  Radiology/Studies:  Dg Chest Portable 1 View  Result Date: 01/10/2018 CLINICAL DATA:  Chest pain and shortness breath. EXAM: PORTABLE CHEST 1 VIEW COMPARISON:  12/17/2017 FINDINGS: Mild bandlike opacities left lung base favors atelectasis or scarring. The lungs appear otherwise clear. Prior CABG. Mildly low lung volumes. IMPRESSION: 1. Residual mild left basilar bandlike opacity favoring atelectasis or scarring. Electronically Signed   By: Van Clines M.D.   On: 01/10/2018 16:35    Assessment and Plan:   Andre Ford is a 37 y.o. male with CAD s/p recent CABG x 3 and DM, presenting to the ED with acute onset chest and neck pain.  1. Chest and Neck Pain: based on description of pain and no acute ST changes on EKG, do not suspect coronary ischemia. 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 We will admit for overnight observation to tele and will cycle cardiac enzymes x 3 for r/o. We will also check inflammatory markers, ESR and CRP. Will also check a d-dimer and will order a 2D Echo to assess the pericardium and reassess LVEF and wall motion. Will give trial of IV Toradol. No heparin, unless he rules in for MI.   2. CAD: s/p recent CABG x 3. left internal mammary artery to left anterior descending, free right internal mammary artery to the obtuse marginal and left radial artery to posterior descending. Continue medical therapy. Will cycle trops x 3 for r/o. Repeat echo.   3. DM: continue home regimen.    For questions or updates, please contact [place] Please consult [email or web address] for contact info under     Signed, Lyda Jester, PA-C  01/10/2018 5:00 PM   I have personally seen and examined this patient with Lyda Jester, PA-C. I agree with the assessment and plan as outlined above. He is presenting with acute onset of upper abdominal, left chest/flank pain, pain in left neck. The pain  started one hour prior to arrival in the ED. The pain is severe and worsened with deep inspiration and when lying supine. CABG 8 weeks ago. Has done well since then until today. No sick contacts. He reports nasal drainage since his bypass. No fever, chills at home. Troponin negative x 1. I have reviewed his EKG from office follow up 12/03/17 and serial EKG today. There is baseline subtle inferior ST elevation that was present on the EKG one month ago. No other changes or findings suggestive of ischemia/injury. BMET and CBC are ok.   My exam: General: Well developed, well nourished, Appears uncomfortable HEENT: OP clear, mucus membranes moist  SKIN: warm, dry. No rashes. Neuro: No focal deficits  Musculoskeletal: Muscle strength 5/5 all ext  Psychiatric: Mood and affect normal  Neck: No JVD, no carotid bruits, no thyromegaly, no lymphadenopathy.  Lungs:Clear bilaterally, no wheezes, rhonci, crackles Cardiovascular: Regular rate and rhythm. No murmurs, gallops or rubs. Abdomen:Soft. Bowel sounds present. Non-tender.  Extremities: No lower extremity edema. Pulses are 2 + in the bilateral DP/PT.  Plan: Chest pain/Neck pain/Epigastric pain: The pain in his left lower chest is worsened with deep inspiration. His presentation does not appear to be consistent with acute coronary syndrome. EKG is unchanged from one month ago. First troponin negative. I suspect that his pleuritic pain is due to pericarditis/Dresslers syndrome post  CABG. No pericardial rub on exam. Chest x-ray without pleural effusion.  Will admit and cycle troponin. Will attempt pain control with Toradol.. Will begin anti-inflammatory medications as well. Check d-dimer although low suspicion for PE at this time. Echo in am to exclude pericardial effusion. He is normotensive and heart rate in the 90s.   Lauree Chandler 01/10/2018 5:23 PM

## 2018-01-11 ENCOUNTER — Telehealth: Payer: Self-pay | Admitting: Cardiology

## 2018-01-11 ENCOUNTER — Encounter (HOSPITAL_COMMUNITY): Payer: Self-pay | Admitting: Physician Assistant

## 2018-01-11 ENCOUNTER — Observation Stay (HOSPITAL_BASED_OUTPATIENT_CLINIC_OR_DEPARTMENT_OTHER): Payer: BLUE CROSS/BLUE SHIELD

## 2018-01-11 DIAGNOSIS — Z79899 Other long term (current) drug therapy: Secondary | ICD-10-CM | POA: Diagnosis not present

## 2018-01-11 DIAGNOSIS — R072 Precordial pain: Secondary | ICD-10-CM | POA: Diagnosis not present

## 2018-01-11 DIAGNOSIS — E119 Type 2 diabetes mellitus without complications: Secondary | ICD-10-CM | POA: Diagnosis not present

## 2018-01-11 DIAGNOSIS — R0789 Other chest pain: Secondary | ICD-10-CM | POA: Diagnosis not present

## 2018-01-11 DIAGNOSIS — Z7984 Long term (current) use of oral hypoglycemic drugs: Secondary | ICD-10-CM | POA: Diagnosis not present

## 2018-01-11 DIAGNOSIS — Z951 Presence of aortocoronary bypass graft: Secondary | ICD-10-CM | POA: Diagnosis not present

## 2018-01-11 DIAGNOSIS — Z87891 Personal history of nicotine dependence: Secondary | ICD-10-CM | POA: Diagnosis not present

## 2018-01-11 DIAGNOSIS — I2510 Atherosclerotic heart disease of native coronary artery without angina pectoris: Secondary | ICD-10-CM | POA: Diagnosis not present

## 2018-01-11 DIAGNOSIS — I10 Essential (primary) hypertension: Secondary | ICD-10-CM | POA: Diagnosis not present

## 2018-01-11 LAB — ECHOCARDIOGRAM COMPLETE
Height: 71 in
Weight: 3705.6 oz

## 2018-01-11 LAB — HIV ANTIBODY (ROUTINE TESTING W REFLEX): HIV Screen 4th Generation wRfx: NONREACTIVE

## 2018-01-11 LAB — TROPONIN I: Troponin I: <0.03 ng/mL (ref <0.03)

## 2018-01-11 MED ORDER — PERFLUTREN LIPID MICROSPHERE
1.0000 mL | INTRAVENOUS | Status: DC | PRN
  Administered 2018-01-11: 2 mL via INTRAVENOUS
  Filled 2018-01-11: qty 10

## 2018-01-11 NOTE — Telephone Encounter (Signed)
TOC Patient-   Please call Patient-   Pt has an appointment with Andre LeydenNina Ford on 01-25-18.

## 2018-01-11 NOTE — Progress Notes (Addendum)
Progress Note  Patient Name: Andre Ford Date of Encounter: 01/11/2018  Primary Cardiologist:  Sherren Mocha, MD  Subjective   He feels much better today. 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  Feels it was MS pain, only has a small amt pain back of his neck w/ deep inspiration. Wants to go home.  Inpatient Medications    Scheduled Meds: . aspirin  325 mg Oral Daily  . escitalopram  10 mg Oral Daily  . Icosapent Ethyl  4 g Oral Daily  . ketorolac  30 mg Intravenous Q6H  . metFORMIN  500 mg Oral BID  . metoprolol tartrate  50 mg Oral BID  . pantoprazole  40 mg Oral Daily  . rosuvastatin  10 mg Oral Daily   Continuous Infusions:  PRN Meds: acetaminophen, ALPRAZolam, ondansetron (ZOFRAN) IV   Vital Signs    Vitals:   01/10/18 1811 01/10/18 1817 01/10/18 2139 01/11/18 0330  BP:  111/81 131/74 98/73  Pulse:  90 80 87  Resp:  18  16  Temp:  98.3 F (36.8 C)  98.4 F (36.9 C)  TempSrc:    Oral  SpO2:  98%  98%  Weight: 106.7 kg   105.1 kg  Height: '5\' 11"'$  (1.803 m)      No intake or output data in the 24 hours ending 01/11/18 0814 Filed Weights   01/10/18 1811 01/11/18 0330  Weight: 106.7 kg 105.1 kg    Telemetry    SR, ST - Personally Reviewed  ECG    12/12, SR, HR 91, no acute ischemic changes- Personally Reviewed  Physical Exam   General: Well developed, well nourished, male appearing in no acute distress. Head: Normocephalic, atraumatic.  Neck: Supple without bruits, JVD not elevated. Lungs:  Resp regular and unlabored, CTA. Incision healing well. Heart: RRR, S1, S2, no S3, S4, or murmur; no rub. Abdomen: Soft, non-tender, non-distended with normoactive bowel sounds. No hepatomegaly. No rebound/guarding. No obvious abdominal masses. Extremities: No clubbing, cyanosis, no edema. Distal pedal pulses are 2+ bilaterally. Neuro: Alert and oriented X 3. Moves all extremities spontaneously. Psych: Normal affect.  Labs    Hematology Recent Labs  Lab 01/10/18 1630  WBC 8.5  RBC 5.63  HGB 15.2  HCT 46.6  MCV 82.8  MCH 27.0  MCHC 32.6  RDW 13.3  PLT 178    Chemistry Recent Labs  Lab 01/10/18 1630 01/10/18 1640  NA 138  --   K 4.2  --   CL 105  --   CO2 21*  --   GLUCOSE 109*  --   BUN 16  --   CREATININE 0.81  --   CALCIUM 9.4  --   PROT  --  7.2  ALBUMIN  --  4.2  AST  --  17  ALT  --  27  ALKPHOS  --  85  BILITOT  --  1.4*  GFRNONAA >60  --   GFRAA >60  --   ANIONGAP 12  --      Cardiac Enzymes Recent Labs  Lab 01/10/18 1731 01/10/18 2018 01/10/18 2300  TROPONINI <0.03 <0.03 <0.03    Recent Labs  Lab 01/10/18 1633  TROPIPOC 0.00     DDimer  Recent Labs  Lab 01/10/18 1731  DDIMER 0.32    Lab Results  Component Value Date   ESRSEDRATE 2 01/10/2018   Lab Results  Component Value Date   CRP 5.6 (H) 01/10/2018  Radiology    Dg Chest Portable 1 View  Result Date: 01/10/2018 CLINICAL DATA:  Chest pain and shortness breath. EXAM: PORTABLE CHEST 1 VIEW COMPARISON:  12/17/2017 FINDINGS: Mild bandlike opacities left lung base favors atelectasis or scarring. The lungs appear otherwise clear. Prior CABG. Mildly low lung volumes. IMPRESSION: 1. Residual mild left basilar bandlike opacity favoring atelectasis or scarring. Electronically Signed   By: Van Clines M.D.   On: 01/10/2018 16:35     Cardiac Studies   ECHO:  Done, results pending  Patient Profile     37 y.o. male w/ hx CABG x 3 w/ LIMA-LAD, RIMA-OM, L radial>PDA on 11/16/2017, HTN, HLD, and DM, was admitted 12/11 with chest pain.  Assessment & Plan     Active Problems:   Chest pain, atypical - sx most c/w pericarditis/Dressler's syndrome vs chest wall pain from over-doing it (patient feels that is it). - ez neg MI - ESR, d-dimer ok, CRP slightly elevated - echo ordered - f/u on results. If ok, MD advise on d/c - since pt already on ASA 325 mg qd, should he get the full 800 mg  ibuprofen tid or go w/ a lower dose, possibly shorter time since sx may be chest wall pain.  Plan: possible d/c today   Signed, Rosaria Ferries , PA-C 8:14 AM 01/11/2018 Pager: [phone]  I have personally seen and examined this patient with Rosaria Ferries, PA-C. I agree with the assessment and plan as outlined above. His chest pain is atypical. Troponin negative x 3. Echo with normal LV systolic function. No evidence of pericardial effusion. His pain is felt to be musculoskeletal. He feels much better. Pain is resolved. He wants to go home. Will discharge today. Continue ASA 325 mg daily. He has Tramadol at home that he will use for pain.   Lauree Chandler 01/11/2018 9:24 AM

## 2018-01-11 NOTE — Telephone Encounter (Addendum)
As of now,pt is in the hospital. Possible will  be D/C home today.

## 2018-01-11 NOTE — Progress Notes (Signed)
Pt returned from echo stated he was being d/c, took telemetry off, refused to put it back on, IV removed by RN after pt started removing per self, pt stated was not willing to wait long for d/c, d/c orders were received but pt left facility before receiving d/c paperwork.

## 2018-01-11 NOTE — Progress Notes (Signed)
  Echocardiogram 2D Echocardiogram has been performed.  Delcie RochENNINGTON, Sarvesh Meddaugh 01/11/2018, 8:49 AM

## 2018-01-11 NOTE — Discharge Summary (Signed)
Discharge Summary    Patient ID: Andre Ford,  MRN: [id number], DOB/AGE: 37/04/1980 37 y.o.  Admit date: 01/10/2018 Discharge date: 01/11/2018  Primary Care Provider: Adrian [place], Stephen Primary Cardiologist: Tonny BollmanMichael Cooper, MD   Discharge Diagnoses    Active Problems:   Chest pain, pleuritic   Allergies No Known Allergies  Diagnostic Studies/Procedures    ECHO: 01/12/2018 Results pending _____________   History of Present Illness     37 y.o. male w/ hx CABG x 3 w/ LIMA-LAD, RIMA-OM, L radial>PDA on 11/16/2017, HTN, HLD, and DM, was admitted 12/11 with chest pain.  Hospital Course     Consultants: none   Cardiac ez were negative for MI, ECG was not acute and d-dimer was negative.  Symptoms improved with IV Toradol.  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  This involved repeated movements lifting things over his head.  He stated the pain began about an hour later.  He stated it felt like chest wall pain, but when the pain went up into his neck, he got a little concerned.  That is why he came to the hospital.  Markers for inflammation were performed including a d-dimer, a sed rate and a CRP.  The CRP was mildly elevated, but the d-dimer and the sed rate were all negative.  On 12/13, he was evaluated by Dr. Clifton JamesMcAlhany and all data were reviewed.  His symptoms had improved greatly on the Toradol.  He did not have a rub on exam and his ECG did not show signs of pericarditis.  An echocardiogram was performed to make sure there was no significant pericardial effusion.  Final read is pending, but preliminary review shows no significant pericardial effusion.  He will continue aspirin at 325 mg daily and states he has some tramadol at home that he can use for pain control as well.  No further inpatient work-up is indicated and he is considered stable for discharge, to follow-up as an outpatient. _____________  Discharge  Vitals Blood pressure 98/73, pulse 87, temperature 98.4 F (36.9 C), temperature source Oral, resp. rate 16, height 5\' 11"  (1.803 m), weight 105.1 kg, SpO2 98 %.  Filed Weights   01/10/18 1811 01/11/18 0330  Weight: 106.7 kg 105.1 kg    Labs & Radiologic Studies    CBC Recent Labs    01/10/18 1630  WBC 8.5  HGB 15.2  HCT 46.6  MCV 82.8  PLT 178   Basic Metabolic Panel Recent Labs    40/98/1111/01/18 1630  NA 138  K 4.2  CL 105  CO2 21*  GLUCOSE 109*  BUN 16  CREATININE 0.81  CALCIUM 9.4   Liver Function Tests Recent Labs    01/10/18 1640  AST 17  ALT 27  ALKPHOS 85  BILITOT 1.4*  PROT 7.2  ALBUMIN 4.2   Recent Labs    01/10/18 1640  LIPASE 39   Cardiac Enzymes Recent Labs    01/10/18 1731 01/10/18 2018 01/10/18 2300  TROPONINI <0.03 <0.03 <0.03   D-Dimer Recent Labs    01/10/18 1731  DDIMER 0.32  _____________  Dg Chest Portable 1 View  Result Date: 01/10/2018 CLINICAL DATA:  Chest pain and shortness breath. EXAM: PORTABLE CHEST 1 VIEW COMPARISON:  12/17/2017 FINDINGS: Mild bandlike opacities left lung base favors atelectasis or scarring. The lungs appear otherwise clear. Prior CABG. Mildly low lung volumes. IMPRESSION: 1. Residual mild left basilar bandlike opacity favoring atelectasis or  scarring. Electronically Signed   By: Gaylyn Rong M.D.   On: 01/10/2018 16:35    Disposition   Pt is being discharged home today in good condition.  Follow-up Plans & Appointments    Follow-up Information    Berton Bon, NP Follow up on 01/25/2018.   Specialty:  Cardiology Why:  Post hospital follow-up.  Please arrive at 2:45 PM for 3:00 PM appointment. Contact information: [address] [place] [address] [phone]          Discharge Instructions    Diet Carb Modified   Complete by:  As directed    Low sodium   Increase activity slowly   Complete by:  As directed       Discharge Medications   Allergies as of  01/11/2018   No Known Allergies     Medication List    STOP taking these medications   cephALEXin 500 MG capsule Commonly known as:  KEFLEX     TAKE these medications   ALPRAZolam 0.5 MG tablet Commonly known as:  XANAX Take 1 tablet (0.5 mg total) by mouth at bedtime as needed for anxiety.   aspirin 325 MG EC tablet Take 1 tablet (325 mg total) by mouth daily.   escitalopram 10 MG tablet Commonly known as:  LEXAPRO Take 10 mg by mouth daily.   esomeprazole 40 MG capsule Commonly known as:  NEXIUM Take 1 capsule (40 mg total) by mouth daily at 12 noon.   JARDIANCE 25 MG Tabs tablet Generic drug:  empagliflozin Take 25 mg by mouth daily.   metFORMIN 500 MG tablet Commonly known as:  GLUCOPHAGE Take 1 tablet (500 mg total) 2 (two) times daily by mouth.   metoprolol tartrate 50 MG tablet Commonly known as:  LOPRESSOR Take 1 tablet (50 mg total) by mouth 2 (two) times daily.   rosuvastatin 10 MG tablet Commonly known as:  CRESTOR Take 1 tablet (10 mg total) by mouth daily.   VASCEPA 1 g Caps Generic drug:  Icosapent Ethyl Take 4 g by mouth daily.         Outstanding Labs/Studies   None  Duration of Discharge Encounter   Greater than 30 minutes including physician time.  Bobbye Riggs Barrett NP 01/11/2018, 10:28 AM

## 2018-01-11 NOTE — Discharge Instructions (Signed)
Chest Wall Pain °Chest wall pain is pain in or around the bones and muscles of your chest. Sometimes, an injury causes this pain. Sometimes, the cause may not be known. This pain may take several weeks or longer to get better. °Follow these instructions at home: °Pay attention to any changes in your symptoms. Take these actions to help with your pain: °· Rest as told by your doctor. °· Avoid activities that cause pain. Try not to use your chest, belly (abdominal), or side muscles to lift heavy things. °· If directed, apply ice to the painful area: °? Put ice in a plastic bag. °? Place a towel between your skin and the bag. °? Leave the ice on for 20 minutes, 2-3 times per day. °· Take over-the-counter and prescription medicines only as told by your doctor. °· Do not use tobacco products, including cigarettes, chewing tobacco, and e-cigarettes. If you need help quitting, ask your doctor. °· Keep all follow-up visits as told by your doctor. This is important. ° °Contact a doctor if: °· You have a fever. °· Your chest pain gets worse. °· You have new symptoms. °Get help right away if: °· You feel sick to your stomach (nauseous) or you throw up (vomit). °· You feel sweaty or light-headed. °· You have a cough with phlegm (sputum) or you cough up blood. °· You are short of breath. °This information is not intended to replace advice given to you by your health care provider. Make sure you discuss any questions you have with your health care provider. °Document Released: 07/05/2007 Document Revised: 06/24/2015 Document Reviewed: 04/13/2014 °Elsevier Interactive Patient Education © 2018 Elsevier Inc. ° °

## 2018-01-14 NOTE — Telephone Encounter (Signed)
  [facility].  Patient understands to follow up with provider Lizabeth LeydenNina Hammond on 01/25/18 at 9:00 at [address] Patient understands discharge instructions? yes Patient understands medications and regiment? yes Patient understands to bring all medications to this visit? yes

## 2018-01-24 ENCOUNTER — Encounter: Payer: Self-pay | Admitting: Cardiology

## 2018-01-24 NOTE — Progress Notes (Signed)
Cardiology Office Note:    Date:  01/25/2018   ID:  Andre Ford, DOB [birth year], MRN [id number]  PCP:  Adrian Prince, MD  Cardiologist:  Tonny Bollman, MD  Referring MD: Adrian Prince, MD   Chief Complaint  Patient presents with  . Hospitalization Follow-up    chest pain    History of Present Illness:    Andre Ford is a 37 y.o. male with a past medical history significant for CABG x 3w/ LIMA-LAD, RIMA-OM, L radial>PDA on 11/16/2017, HTN, HLD,and DM.  He was admitted to [facility] on 01/09/2018 with complaints of chest pain.  Cardiac enzymes were negative, EKG was nonacute and d-dimer was negative.  CRP was mildly elevated and sed rate was normal.  The patient's symptoms improved greatly on Toradol.  There was no rub on exam and EKG did not show signs of pericarditis.  Echocardiogram done on 01/11/2018 showed normal LVEF with no wall motion abnormalities, mild reduction in RV function, trivial posterior pericardial effusion.  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  Symptoms were felt to be musculoskeletal in nature.  Andre Ford is here today for hospital follow up. He is no longer having the chest pain but still has some muscle pulling in his left upper chest when he turns his head to the right. His rith forearm is still tender at artery harvest site. He is still struggling to get his breathing back to normal. He walks ok and feels "really good" but does not feel that he can do increase his activity. He walks at work but has not been doing any walking for exercise.  He has not started cardiac rehab phase 2 yet.    Past Medical History:  Diagnosis Date  . Anxiety   . Arthritis    "qwhere" (11/14/2017)  . At risk for sleep apnea    STOP-BANG= 6     SENT TO PCP 08-05-2014  . Chest pain, pleuritic 01/10/2018  . Chronic pancreatitis (HCC)   . Coronary artery disease   .  Depression   . High triglycerides   . History of kidney stones   . Hyperlipidemia   . Hypertension    "lost wright and this went away" (11/14/2017)  . Obesity   . Type II diabetes mellitus (HCC)    "dx'd 08/2017" (11/14/2017)  . Urethral stricture     Past Surgical History:  Procedure Laterality Date  . APPENDECTOMY  age 55  . CARDIAC CATHETERIZATION    . CORONARY ARTERY BYPASS GRAFT N/A 11/16/2017   Procedure: CORONARY ARTERY BYPASS GRAFTING (CABG) x 3.;  Surgeon: Loreli Slot, MD;  Location: [facility] OR;  Service: Open Heart Surgery;  Laterality: N/A;  BILATERAL IMAS  . CYSTOSCOPY WITH URETHRAL DILATATION N/A 08/10/2014   Procedure: CYSTOSCOPY WITH URETHRAL DILATATION;  Surgeon: Malen Gauze, MD;  [facility];  Service: Urology;  Laterality: N/A;  . CYSTOSCOPY WITH URETHRAL DILATATION N/A 11/16/2017   Procedure: CYSTOSCOPY WITH URETHRAL DILATATION;  Surgeon: Loreli Slot, MD;  Location: [facility] OR;  Service: Open Heart Surgery;  Laterality: N/A;  performed by Dr. Ronne Binning  . FRACTURE SURGERY    . KNEE ARTHROSCOPY WITH ANTERIOR CRUCIATE LIGAMENT (ACL) REPAIR Left 2000  . LEFT HEART CATH AND CORONARY ANGIOGRAPHY N/A 11/12/2017   Procedure: LEFT HEART CATH AND CORONARY ANGIOGRAPHY;  Surgeon: Elder Negus, MD;  Location: [facility] INVASIVE  CV LAB;  Service: Cardiovascular;  Laterality: N/A;  . OPEN REDUCTION INTERNAL FIXATION (ORIF) HAND Right 2000   "boxer fx"  . PENILE BIOPSY N/A 08/10/2014   Procedure: Core biopsy of penile glands lesion;  Surgeon: Malen GauzePatrick L McKenzie, MD;  [facility];  Service: Urology;  Laterality: N/A;  . RADIAL ARTERY HARVEST Left 11/16/2017   Procedure: RADIAL ARTERY HARVEST;  Surgeon: Loreli SlotHendrickson, Steven C, MD;  [facility];  Service: Open Heart Surgery;  Laterality: Left;  . TEE WITHOUT CARDIOVERSION N/A 11/16/2017   Procedure: TRANSESOPHAGEAL ECHOCARDIOGRAM (TEE);  Surgeon: Loreli SlotHendrickson, Steven  C, MD;  [facility];  Service: Open Heart Surgery;  Laterality: N/A;  . URETHROPLASTY  2016    Current Medications: Current Meds  Medication Sig  . ALPRAZolam (XANAX) 0.5 MG tablet Take 1 tablet (0.5 mg total) by mouth at bedtime as needed for anxiety.  Marland Kitchen. aspirin EC 325 MG EC tablet Take 1 tablet (325 mg total) by mouth daily.  Marland Kitchen. escitalopram (LEXAPRO) 10 MG tablet Take 10 mg by mouth daily.  Bess Harvest. Icosapent Ethyl (VASCEPA) 1 g CAPS Take 4 g by mouth daily.  Marland Kitchen. JARDIANCE 25 MG TABS tablet Take 25 mg by mouth daily.  . metFORMIN (GLUCOPHAGE) 500 MG tablet Take 1 tablet (500 mg total) 2 (two) times daily by mouth.  . metoprolol tartrate (LOPRESSOR) 50 MG tablet Take 1 tablet (50 mg total) by mouth 2 (two) times daily.  . rosuvastatin (CRESTOR) 10 MG tablet Take 1 tablet (10 mg total) by mouth daily.     Allergies:   Patient has no known allergies.   Social History   Socioeconomic History  . Marital status: Married    Spouse name: Not on file  . Number of children: Not on file  . Years of education: Not on file  . Highest education level: Not on file  Occupational History  . Not on file  Social Needs  . Financial resource strain: Not on file  . Food insecurity:    Worry: Not on file    Inability: Not on file  . Transportation needs:    Medical: Not on file    Non-medical: Not on file  Tobacco Use  . Smoking status: Former Smoker    Packs/day: 1.00    Years: 10.00    Pack years: 10.00    Types: Cigarettes    Last attempt to quit: 08/05/2011    Years since quitting: 6.4  . Smokeless tobacco: Former NeurosurgeonUser    Types: Snuff  Substance and Sexual Activity  . Alcohol use: Yes    Comment: 11/14/2017 "nothing since pancreatitis dx'd in 2016"  . Drug use: Not Currently  . Sexual activity: Not on file  Lifestyle  . Physical activity:    Days per week: Not on file    Minutes per session: Not on file  . Stress: Not on file  Relationships  . Social connections:    Talks on phone:  Not on file    Gets together: Not on file    Attends religious service: Not on file    Active member of club or organization: Not on file    Attends meetings of clubs or organizations: Not on file    Relationship status: Not on file  Other Topics Concern  . Not on file  Social History Narrative  . Not on file     Family History: The patient's family history includes CAD in his paternal grandfather; Diabetes type I  in his brother; Leukemia in his father; Lupus in his sister. There is no history of Heart disease. ROS:   Please see the history of present illness.     All other systems reviewed and are negative.  EKGs/Labs/Other Studies Reviewed:    The following studies were reviewed today:  Echocardiogram 01/11/2018 Study Conclusions - Left ventricle: The cavity size was normal. There was mild   concentric hypertrophy. Systolic function was normal. The   estimated ejection fraction was in the range of 50% to 55%. Wall   motion was normal; there were no regional wall motion   abnormalities. Left ventricular diastolic function parameters   were normal. - Ventricular septum: These changes are consistent with a   post-thoracotomy state. - Right ventricle: Systolic function was mildly reduced. - Pericardium, extracardiac: A trivial pericardial effusion was   identified posterior to the heart.  Impressions: - Normal LV EF without wall motion abnormalities. Mild reduction in   RV function. Trivial posterior pericardial effusion. Echo   contrast used to better define wall motion.  LEFT HEART CATH AND CORONARY ANGIOGRAPHY 11/12/17  Conclusion   LM: Distal calcified 40% stenosis LAD: Prox calcified 40% stenosis Mid 95% stenosis LCx: Mid long 95% stenosis RCA: Prox 95%, mid to distal long diffuse 75% stenosis  LVEDP normal LVEDP 55-60%  Recommendation: Admit to telemetry Continue aspirin/heparin/statin CVTS consult for CABG in view of uncontrolled DM,  hypertriglyceridemia and severe multivessel CAD     EKG:  EKG is not ordered today.   Recent Labs: 11/17/2017: Magnesium 1.9 01/10/2018: ALT 27; BUN 16; Creatinine, Ser 0.81; Hemoglobin 15.2; Platelets 178; Potassium 4.2; Sodium 138   Recent Lipid Panel    Component Value Date/Time   CHOL 232 (H) 10/13/2014 0500   TRIG 837 (H) 10/13/2014 0500   HDL 26 (L) 10/13/2014 0500   CHOLHDL 8.9 10/13/2014 0500   VLDL UNABLE TO CALCULATE IF TRIGLYCERIDE OVER 400 mg/dL 16/10/9602 5409   LDLCALC UNABLE TO CALCULATE IF TRIGLYCERIDE OVER 400 mg/dL 81/19/1478 2956    Physical Exam:    VS:  BP 110/78   Pulse 74   Ht 5\' 11"  (1.803 m)   Wt 236 lb 12.8 oz (107.4 kg)   SpO2 97%   BMI 33.03 kg/m     Wt Readings from Last 3 Encounters:  01/25/18 236 lb 12.8 oz (107.4 kg)  01/11/18 231 lb 9.6 oz (105.1 kg)  12/17/17 230 lb (104.3 kg)     Physical Exam  Constitutional: He is oriented to person, place, and time. He appears well-developed and well-nourished. No distress.  HENT:  Head: Normocephalic and atraumatic.  Neck: Normal range of motion. Neck supple. No JVD present.  Cardiovascular: Normal rate, regular rhythm, normal heart sounds and intact distal pulses. Exam reveals no gallop and no friction rub.  No murmur heard. Pulmonary/Chest: Effort normal and breath sounds normal. No respiratory distress. He has no wheezes. He has no rales.  Abdominal: Soft. Bowel sounds are normal.  Musculoskeletal: Normal range of motion.        General: No deformity or edema.  Neurological: He is alert and oriented to person, place, and time.  Skin: Skin is warm and dry.  Left forearm with scabbed artery harvest site  Psychiatric: He has a normal mood and affect. His behavior is normal. Thought content normal.  Vitals reviewed.    ASSESSMENT:    1. Chest pain, pleuritic   2. Coronary artery disease involving native coronary artery of native heart without angina  pectoris   3. Hypercholesterolemia  with hypertriglyceridemia   4. Type 2 diabetes mellitus with hyperglycemia, without long-term current use of insulin (HCC)    PLAN:    In order of problems listed above:  Atypical chest pain -Admitted to the hospital 01/10/2018-01/11/2018 for evaluation of atypical chest pain. Felt to be musculoskeletal in nature with no evidence for pericardial effusion. 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 His pain now improved, just some pulling in his upper chest with movement.   CAD s/p CABG x3 10/2017 -Continues on aspirin 325 mg, beta-blocker, statin -No anginal symptoms or heart failure type symptoms.  -He is back to work but has not started a walking regimen. Advised to start with 10 minutes and build up slowly until he gets to Cardiac Rehab phase 2. Aim for at least 150 minutes.week. Goal should be pace of 3 miles/hours, or walking 1.5 miles in 30 minutes. -Recommend heart healthy/Mediterranean diet, with whole grains, fruits, vegetable, fish, lean meats, nuts, and olive oil. Limit salt. -Recommend avoidance of tobacco products. Avoid excess alcohol.  Hyperlipidemia -On Crestor 10 mg daily -Lipids are followed by PCP, not available in Epic. We discussed goal of LDL<70 and can have his Crestor increased if not to goal.   Diabetes type 2 -On metformin and Jardiance. He reports improved control since his CABG.    Medication Adjustments/Labs and Tests Ordered: Current medicines are reviewed at length with the patient today.  Concerns regarding medicines are outlined above. Labs and tests ordered and medication changes are outlined in the patient instructions below:  Patient Instructions  Medication Instructions:   Your physician recommends that you continue on your current medications as directed. Please refer to the Current Medication list given to you today.   If you  need a refill on your cardiac medications before your next appointment, please call your pharmacy.   Lab work: NONE ORDERED  TODAY    If you have labs (blood work) drawn today and your tests are completely normal, you will receive your results only by: Marland Kitchen MyChart Message (if you have MyChart) OR . A paper copy in the mail If you have any lab test that is abnormal or we need to change your treatment, we will call you to review the results.  Testing/Procedures:  NONE ORDERED  TODAY   Follow-Up: Your physician wants you to follow-up in:  IN 6   MONTHS WITH DR Excell Seltzer  You will receive a reminder letter in the mail two months in advance. If you don't receive a letter, please call our office to schedule the follow-up appointment.   Any Other Special Instructions Will Be Listed Below (If Applicable).   -Recommend cholesterol check (PCP to do) with LDL (bad cholesterol) goal <70. Can increase statin medication if needed.  -Recommend heart healthy/Mediterranean diet, with whole grains, fruits, vegetable, fish, lean meats, nuts, and olive oil. Limit salt. -Recommend moderate walking, 3-5 times/week for 30-50 minutes each session. Aim for at least 150 minutes.week. Goal should be pace of 3 miles/hours, or walking 1.5 miles in 30 minutes -Recommend avoidance of tobacco products. Avoid excess alcohol.   DASH Eating Plan DASH stands for "Dietary Approaches to Stop Hypertension." The DASH eating plan is a healthy eating plan that has been shown to reduce high blood pressure (hypertension). It may also reduce your risk for type 2 diabetes, heart disease, and stroke.  The DASH eating plan may also help with weight loss. What are tips for following this plan?  General guidelines  Avoid eating more than 2,300 mg (milligrams) of salt (sodium) a day. If you have hypertension, you may need to reduce your sodium intake to 1,500 mg a day.  Limit alcohol intake to no more than 1 drink a day for nonpregnant  women and 2 drinks a day for men. One drink equals 12 oz of beer, 5 oz of wine, or 1 oz of hard liquor.  Work with your health care provider to maintain a healthy body weight or to lose weight. Ask what an ideal weight is for you.  Get at least 30 minutes of exercise that causes your heart to beat faster (aerobic exercise) most days of the week. Activities may include walking, swimming, or biking.  Work with your health care provider or diet and nutrition specialist (dietitian) to adjust your eating plan to your individual calorie needs. Reading food labels   Check food labels for the amount of sodium per serving. Choose foods with less than 5 percent of the Daily Value of sodium. Generally, foods with less than 300 mg of sodium per serving fit into this eating plan.  To find whole grains, look for the word "whole" as the first word in the ingredient list. Shopping  Buy products labeled as "low-sodium" or "no salt added."  Buy fresh foods. Avoid canned foods and premade or frozen meals. Cooking  Avoid adding salt when cooking. Use salt-free seasonings or herbs instead of table salt or sea salt. Check with your health care provider or pharmacist before using salt substitutes.  Do not fry foods. Cook foods using healthy methods such as baking, boiling, grilling, and broiling instead.  Cook with heart-healthy oils, such as olive, canola, soybean, or sunflower oil. Meal planning  Eat a balanced diet that includes: ? 5 or more servings of fruits and vegetables each day. At each meal, try to fill half of your plate with fruits and vegetables. ? Up to 6-8 servings of whole grains each day. ? Less than 6 oz of lean meat, poultry, or fish each day. A 3-oz serving of meat is about the same size as a deck of cards. One egg equals 1 oz. ? 2 servings of low-fat dairy each day. ? A serving of nuts, seeds, or beans 5 times each week. ? Heart-healthy fats. Healthy fats called Omega-3 fatty acids  are found in foods such as flaxseeds and coldwater fish, like sardines, salmon, and mackerel.  Limit how much you eat of the following: ? Canned or prepackaged foods. ? Food that is high in trans fat, such as fried foods. ? Food that is high in saturated fat, such as fatty meat. ? Sweets, desserts, sugary drinks, and other foods with added sugar. ? Full-fat dairy products.  Do not salt foods before eating.  Try to eat at least 2 vegetarian meals each week.  Eat more home-cooked food and less restaurant, buffet, and fast food.  When eating at a restaurant, ask that your food be prepared with less salt or no salt, if possible. What foods are recommended? The items listed may not be a complete list. Talk with your dietitian about what dietary choices are best for you. Grains Whole-grain or whole-wheat bread. Whole-grain or whole-wheat pasta. Brown rice. Orpah Cobb. Bulgur. Whole-grain and low-sodium cereals. Pita bread. Low-fat, low-sodium crackers. Whole-wheat flour tortillas. Vegetables Fresh or frozen vegetables (raw, steamed, roasted, or grilled).  Low-sodium or reduced-sodium tomato and vegetable juice. Low-sodium or reduced-sodium tomato sauce and tomato paste. Low-sodium or reduced-sodium canned vegetables. Fruits All fresh, dried, or frozen fruit. Canned fruit in natural juice (without added sugar). Meat and other protein foods Skinless chicken or Malawiturkey. Ground chicken or Malawiturkey. Pork with fat trimmed off. Fish and seafood. Egg whites. Dried beans, peas, or lentils. Unsalted nuts, nut butters, and seeds. Unsalted canned beans. Lean cuts of beef with fat trimmed off. Low-sodium, lean deli meat. Dairy Low-fat (1%) or fat-free (skim) milk. Fat-free, low-fat, or reduced-fat cheeses. Nonfat, low-sodium ricotta or cottage cheese. Low-fat or nonfat yogurt. Low-fat, low-sodium cheese. Fats and oils Soft margarine without trans fats. Vegetable oil. Low-fat, reduced-fat, or light  mayonnaise and salad dressings (reduced-sodium). Canola, safflower, olive, soybean, and sunflower oils. Avocado. Seasoning and other foods Herbs. Spices. Seasoning mixes without salt. Unsalted popcorn and pretzels. Fat-free sweets. What foods are not recommended? The items listed may not be a complete list. Talk with your dietitian about what dietary choices are best for you. Grains Baked goods made with fat, such as croissants, muffins, or some breads. Dry pasta or rice meal packs. Vegetables Creamed or fried vegetables. Vegetables in a cheese sauce. Regular canned vegetables (not low-sodium or reduced-sodium). Regular canned tomato sauce and paste (not low-sodium or reduced-sodium). Regular tomato and vegetable juice (not low-sodium or reduced-sodium). Rosita FirePickles. Olives. Fruits Canned fruit in a light or heavy syrup. Fried fruit. Fruit in cream or butter sauce. Meat and other protein foods Fatty cuts of meat. Ribs. Fried meat. Tomasa BlaseBacon. Sausage. Bologna and other processed lunch meats. Salami. Fatback. Hotdogs. Bratwurst. Salted nuts and seeds. Canned beans with added salt. Canned or smoked fish. Whole eggs or egg yolks. Chicken or Malawiturkey with skin. Dairy Whole or 2% milk, cream, and half-and-half. Whole or full-fat cream cheese. Whole-fat or sweetened yogurt. Full-fat cheese. Nondairy creamers. Whipped toppings. Processed cheese and cheese spreads. Fats and oils Butter. Stick margarine. Lard. Shortening. Ghee. Bacon fat. Tropical oils, such as coconut, palm kernel, or palm oil. Seasoning and other foods Salted popcorn and pretzels. Onion salt, garlic salt, seasoned salt, table salt, and sea salt. Worcestershire sauce. Tartar sauce. Barbecue sauce. Teriyaki sauce. Soy sauce, including reduced-sodium. Steak sauce. Canned and packaged gravies. Fish sauce. Oyster sauce. Cocktail sauce. Horseradish that you find on the shelf. Ketchup. Mustard. Meat flavorings and tenderizers. Bouillon cubes. Hot sauce and  Tabasco sauce. Premade or packaged marinades. Premade or packaged taco seasonings. Relishes. Regular salad dressings. Where to find more information:  National Heart, Lung, and Blood Institute: PopSteam.iswww.nhlbi.nih.gov  American Heart Association: www.heart.org Summary  The DASH eating plan is a healthy eating plan that has been shown to reduce high blood pressure (hypertension). It may also reduce your risk for type 2 diabetes, heart disease, and stroke.  With the DASH eating plan, you should limit salt (sodium) intake to 2,300 mg a day. If you have hypertension, you may need to reduce your sodium intake to 1,500 mg a day.  When on the DASH eating plan, aim to eat more fresh fruits and vegetables, whole grains, lean proteins, low-fat dairy, and heart-healthy fats.  Work with your health care provider or diet and nutrition specialist (dietitian) to adjust your eating plan to your individual calorie needs. This information is not intended to replace advice given to you by your health care provider. Make sure you discuss any questions you have with your health care provider. Document Released: 01/05/2011 Document Revised: 01/10/2016 Document Reviewed: 01/10/2016 Elsevier Interactive  Patient Education  [address].       Signed, Berton Bon, [place]  01/25/2018 9:42 AM     [facility]

## 2018-01-25 ENCOUNTER — Encounter: Payer: Self-pay | Admitting: Cardiology

## 2018-01-25 ENCOUNTER — Ambulatory Visit: Payer: BLUE CROSS/BLUE SHIELD | Admitting: Cardiology

## 2018-01-25 VITALS — BP 110/78 | HR 74 | Ht 71.0 in | Wt 236.8 lb

## 2018-01-25 DIAGNOSIS — E1165 Type 2 diabetes mellitus with hyperglycemia: Secondary | ICD-10-CM

## 2018-01-25 DIAGNOSIS — I2510 Atherosclerotic heart disease of native coronary artery without angina pectoris: Secondary | ICD-10-CM | POA: Diagnosis not present

## 2018-01-25 DIAGNOSIS — R0781 Pleurodynia: Secondary | ICD-10-CM | POA: Diagnosis not present

## 2018-01-25 DIAGNOSIS — E782 Mixed hyperlipidemia: Secondary | ICD-10-CM

## 2018-01-25 NOTE — Patient Instructions (Addendum)
Medication Instructions:   Your physician recommends that you continue on your current medications as directed. Please refer to the Current Medication list given to you today.   If you need a refill on your cardiac medications before your next appointment, please call your pharmacy.   Lab work: NONE ORDERED  TODAY    If you have labs (blood work) drawn today and your tests are completely normal, you will receive your results only by: Marland Kitchen. MyChart Message (if you have MyChart) OR . A paper copy in the mail If you have any lab test that is abnormal or we need to change your treatment, we will call you to review the results.  Testing/Procedures:  NONE ORDERED  TODAY   Follow-Up: Your physician wants you to follow-up in:  IN 6   MONTHS WITH DR Excell SeltzerOOPER  You will receive a reminder letter in the mail two months in advance. If you don't receive a letter, please call our office to schedule the follow-up appointment.   Any Other Special Instructions Will Be Listed Below (If Applicable).   -Recommend cholesterol check (PCP to do) with LDL (bad cholesterol) goal <70. Can increase statin medication if needed.  -Recommend heart healthy/Mediterranean diet, with whole grains, fruits, vegetable, fish, lean meats, nuts, and olive oil. Limit salt. -Recommend moderate walking, 3-5 times/week for 30-50 minutes each session. Aim for at least 150 minutes.week. Goal should be pace of 3 miles/hours, or walking 1.5 miles in 30 minutes -Recommend avoidance of tobacco products. Avoid excess alcohol.   DASH Eating Plan DASH stands for "Dietary Approaches to Stop Hypertension." The DASH eating plan is a healthy eating plan that has been shown to reduce high blood pressure (hypertension). It may also reduce your risk for type 2 diabetes, heart disease, and stroke. The DASH eating plan may also help with weight loss. What are tips for following this plan?  General guidelines  Avoid eating more than 2,300 mg  (milligrams) of salt (sodium) a day. If you have hypertension, you may need to reduce your sodium intake to 1,500 mg a day.  Limit alcohol intake to no more than 1 drink a day for nonpregnant women and 2 drinks a day for men. One drink equals 12 oz of beer, 5 oz of wine, or 1 oz of hard liquor.  Work with your health care provider to maintain a healthy body weight or to lose weight. Ask what an ideal weight is for you.  Get at least 30 minutes of exercise that causes your heart to beat faster (aerobic exercise) most days of the week. Activities may include walking, swimming, or biking.  Work with your health care provider or diet and nutrition specialist (dietitian) to adjust your eating plan to your individual calorie needs. Reading food labels   Check food labels for the amount of sodium per serving. Choose foods with less than 5 percent of the Daily Value of sodium. Generally, foods with less than 300 mg of sodium per serving fit into this eating plan.  To find whole grains, look for the word "whole" as the first word in the ingredient list. Shopping  Buy products labeled as "low-sodium" or "no salt added."  Buy fresh foods. Avoid canned foods and premade or frozen meals. Cooking  Avoid adding salt when cooking. Use salt-free seasonings or herbs instead of table salt or sea salt. Check with your health care provider or pharmacist before using salt substitutes.  Do not fry foods. Cook foods using healthy methods  such as baking, boiling, grilling, and broiling instead.  Cook with heart-healthy oils, such as olive, canola, soybean, or sunflower oil. Meal planning  Eat a balanced diet that includes: ? 5 or more servings of fruits and vegetables each day. At each meal, try to fill half of your plate with fruits and vegetables. ? Up to 6-8 servings of whole grains each day. ? Less than 6 oz of lean meat, poultry, or fish each day. A 3-oz serving of meat is about the same size as a deck  of cards. One egg equals 1 oz. ? 2 servings of low-fat dairy each day. ? A serving of nuts, seeds, or beans 5 times each week. ? Heart-healthy fats. Healthy fats called Omega-3 fatty acids are found in foods such as flaxseeds and coldwater fish, like sardines, salmon, and mackerel.  Limit how much you eat of the following: ? Canned or prepackaged foods. ? Food that is high in trans fat, such as fried foods. ? Food that is high in saturated fat, such as fatty meat. ? Sweets, desserts, sugary drinks, and other foods with added sugar. ? Full-fat dairy products.  Do not salt foods before eating.  Try to eat at least 2 vegetarian meals each week.  Eat more home-cooked food and less restaurant, buffet, and fast food.  When eating at a restaurant, ask that your food be prepared with less salt or no salt, if possible. What foods are recommended? The items listed may not be a complete list. Talk with your dietitian about what dietary choices are best for you. Grains Whole-grain or whole-wheat bread. Whole-grain or whole-wheat pasta. Brown rice. Orpah Cobb. Bulgur. Whole-grain and low-sodium cereals. Pita bread. Low-fat, low-sodium crackers. Whole-wheat flour tortillas. Vegetables Fresh or frozen vegetables (raw, steamed, roasted, or grilled). Low-sodium or reduced-sodium tomato and vegetable juice. Low-sodium or reduced-sodium tomato sauce and tomato paste. Low-sodium or reduced-sodium canned vegetables. Fruits All fresh, dried, or frozen fruit. Canned fruit in natural juice (without added sugar). Meat and other protein foods Skinless chicken or [place]. Ground chicken or [place]. Pork with fat trimmed off. Fish and seafood. Egg whites. Dried beans, peas, or lentils. Unsalted nuts, nut butters, and seeds. Unsalted canned beans. Lean cuts of beef with fat trimmed off. Low-sodium, lean deli meat. Dairy Low-fat (1%) or fat-free (skim) milk. Fat-free, low-fat, or reduced-fat cheeses. Nonfat,  low-sodium ricotta or cottage cheese. Low-fat or nonfat yogurt. Low-fat, low-sodium cheese. Fats and oils Soft margarine without trans fats. Vegetable oil. Low-fat, reduced-fat, or light mayonnaise and salad dressings (reduced-sodium). Canola, safflower, olive, soybean, and sunflower oils. Avocado. Seasoning and other foods Herbs. Spices. Seasoning mixes without salt. Unsalted popcorn and pretzels. Fat-free sweets. What foods are not recommended? The items listed may not be a complete list. Talk with your dietitian about what dietary choices are best for you. Grains Baked goods made with fat, such as croissants, muffins, or some breads. Dry pasta or rice meal packs. Vegetables Creamed or fried vegetables. Vegetables in a cheese sauce. Regular canned vegetables (not low-sodium or reduced-sodium). Regular canned tomato sauce and paste (not low-sodium or reduced-sodium). Regular tomato and vegetable juice (not low-sodium or reduced-sodium). Rosita Fire. Olives. Fruits Canned fruit in a light or heavy syrup. Fried fruit. Fruit in cream or butter sauce. Meat and other protein foods Fatty cuts of meat. Ribs. Fried meat. Tomasa Blase. Sausage. Bologna and other processed lunch meats. Salami. Fatback. Hotdogs. Bratwurst. Salted nuts and seeds. Canned beans with added salt. Canned or smoked fish. Whole eggs or  egg yolks. Chicken or [place] with skin. Dairy Whole or 2% milk, cream, and half-and-half. Whole or full-fat cream cheese. Whole-fat or sweetened yogurt. Full-fat cheese. Nondairy creamers. Whipped toppings. Processed cheese and cheese spreads. Fats and oils Butter. Stick margarine. Lard. Shortening. Ghee. Bacon fat. Tropical oils, such as coconut, palm kernel, or palm oil. Seasoning and other foods Salted popcorn and pretzels. Onion salt, garlic salt, seasoned salt, table salt, and sea salt. Worcestershire sauce. Tartar sauce. Barbecue sauce. Teriyaki sauce. Soy sauce, including reduced-sodium. Steak sauce.  Canned and packaged gravies. Fish sauce. Oyster sauce. Cocktail sauce. Horseradish that you find on the shelf. Ketchup. Mustard. Meat flavorings and tenderizers. Bouillon cubes. Hot sauce and Tabasco sauce. Premade or packaged marinades. Premade or packaged taco seasonings. Relishes. Regular salad dressings. Where to find more information:  National Heart, Lung, and Blood Institute: PopSteam.iswww.nhlbi.nih.gov  American Heart Association: www.heart.org Summary  The DASH eating plan is a healthy eating plan that has been shown to reduce high blood pressure (hypertension). It may also reduce your risk for type 2 diabetes, heart disease, and stroke.  With the DASH eating plan, you should limit salt (sodium) intake to 2,300 mg a day. If you have hypertension, you may need to reduce your sodium intake to 1,500 mg a day.  When on the DASH eating plan, aim to eat more fresh fruits and vegetables, whole grains, lean proteins, low-fat dairy, and heart-healthy fats.  Work with your health care provider or diet and nutrition specialist (dietitian) to adjust your eating plan to your individual calorie needs. This information is not intended to replace advice given to you by your health care provider. Make sure you discuss any questions you have with your health care provider. Document Released: 01/05/2011 Document Revised: 01/10/2016 Document Reviewed: 01/10/2016 Elsevier Interactive Patient Education  2019 [employer]

## 2018-01-28 ENCOUNTER — Telehealth (HOSPITAL_COMMUNITY): Payer: Self-pay | Admitting: Pharmacist

## 2018-01-28 NOTE — Telephone Encounter (Signed)
Cardiac Rehab Medication Review by a Pharmacist  Does the patient  feel that his/her medications are working for him/her?  yes  Has the patient been experiencing any side effects to the medications prescribed?  no  Does the patient measure his/her own blood pressure or blood glucose at home?  Measures his blood glucose and runs 120-140   Does the patient have any problems obtaining medications due to transportation or finances?   no  Understanding of regimen: excellent Understanding of indications: good Potential of compliance: good    Pharmacist comments: Patient has good understanding of medications and takes as directed.  Andre Ford, PharmD PGY1 Pharmacy Resident Phone: [phone] 01/28/2018 4:11 PM

## 2018-01-28 NOTE — Progress Notes (Signed)
Andre DenmarkJohn T Dunbar 37 y.o. male DOB 02/12/1980 MRN [id number]       Nutrition  No diagnosis found. Past Medical History:  Diagnosis Date  . Anxiety   . Arthritis    "qwhere" (11/14/2017)  . At risk for sleep apnea    STOP-BANG= 6     SENT TO PCP 08-05-2014  . Chest pain, pleuritic 01/10/2018  . Chronic pancreatitis (HCC)   . Coronary artery disease   . Depression   . High triglycerides   . History of kidney stones   . Hyperlipidemia   . Hypertension    "lost wright and this went away" (11/14/2017)  . Obesity   . Type II diabetes mellitus (HCC)    "dx'd 08/2017" (11/14/2017)  . Urethral stricture    Meds reviewed.    Current Outpatient Medications (Endocrine & Metabolic):  Marland Kitchen.  JARDIANCE 25 MG TABS tablet, Take 25 mg by mouth daily. .  metFORMIN (GLUCOPHAGE) 500 MG tablet, Take 1 tablet (500 mg total) 2 (two) times daily by mouth.  Current Outpatient Medications (Cardiovascular):  Marland Kitchen.  Icosapent Ethyl (VASCEPA) 1 g CAPS, Take 4 g by mouth daily. .  metoprolol tartrate (LOPRESSOR) 50 MG tablet, Take 1 tablet (50 mg total) by mouth 2 (two) times daily. .  rosuvastatin (CRESTOR) 10 MG tablet, Take 1 tablet (10 mg total) by mouth daily.   Current Outpatient Medications (Analgesics):  .  aspirin EC 325 MG EC tablet, Take 1 tablet (325 mg total) by mouth daily.   Current Outpatient Medications (Other):  Marland Kitchen.  ALPRAZolam (XANAX) 0.5 MG tablet, Take 1 tablet (0.5 mg total) by mouth at bedtime as needed for anxiety. Marland Kitchen.  escitalopram (LEXAPRO) 10 MG tablet, Take 10 mg by mouth daily.   HT: Ht Readings from Last 1 Encounters:  01/25/18 5\' 11"  (1.803 m)    WT: Wt Readings from Last 5 Encounters:  01/25/18 236 lb 12.8 oz (107.4 kg)  01/11/18 231 lb 9.6 oz (105.1 kg)  12/17/17 230 lb (104.3 kg)  12/03/17 228 lb 1.9 oz (103.5 kg)  11/21/17 247 lb (112 kg)     BMI = 33.04  (01/25/18)  Current tobacco use? No       Labs:  Lipid Panel     Component Value Date/Time   CHOL 232 (H)  10/13/2014 0500   TRIG 837 (H) 10/13/2014 0500   HDL 26 (L) 10/13/2014 0500   CHOLHDL 8.9 10/13/2014 0500   VLDL UNABLE TO CALCULATE IF TRIGLYCERIDE OVER 400 mg/dL 43/32/951809/13/2016 84160500   LDLCALC UNABLE TO CALCULATE IF TRIGLYCERIDE OVER 400 mg/dL 60/63/016009/13/2016 [id number]    Lab Results  Component Value Date   HGBA1C 8.0 (H) 10/13/2014   CBG (last 3)  No results for input(s): GLUCAP in the last 72 hours.  Nutrition Diagnosis ? Food-and nutrition-related knowledge deficit related to lack of exposure to information as related to diagnosis of: ? CVD ? Type 2 Diabetes ? Obese  I = 30-34.9 related to excessive energy intake as evidenced by a BMI = 33.04  (01/25/18)  Nutrition Goal(s):  ? To be determined  Plan:  Pt to attend nutrition classes ? Nutrition I ? Nutrition II ? Portion Distortion  ? Diabetes Blitz ? Diabetes Q & A Will provide client-centered nutrition education as part of interdisciplinary care.   Monitor and evaluate progress toward nutrition goal with team.  Ross MarcusAubrey Burklin, MS, RD, LDN 01/28/2018 1:38 PM

## 2018-01-31 ENCOUNTER — Encounter (HOSPITAL_COMMUNITY)
Admission: RE | Admit: 2018-01-31 | Discharge: 2018-01-31 | Disposition: A | Discharge status: Home / Self Care | Payer: BLUE CROSS/BLUE SHIELD | Source: Ambulatory Visit | Attending: Cardiovascular Disease | Admitting: Cardiovascular Disease

## 2018-01-31 ENCOUNTER — Encounter (HOSPITAL_COMMUNITY): Payer: Self-pay

## 2018-01-31 VITALS — BP 112/70 | HR 73 | Ht 71.75 in | Wt 241.4 lb

## 2018-01-31 DIAGNOSIS — Z951 Presence of aortocoronary bypass graft: Secondary | ICD-10-CM | POA: Diagnosis not present

## 2018-01-31 NOTE — Progress Notes (Signed)
Andre Ford 38 y.o. male DOB: 02/07/1980 MRN: [id number]      Nutrition Note  1. 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 Anxiety   . Arthritis    "qwhere" (11/14/2017)  . At risk for sleep apnea    STOP-BANG= 6     SENT TO PCP 08-05-2014  . Chest pain, pleuritic 01/10/2018  . Chronic pancreatitis (HCC)   . Coronary artery disease   . Depression   . High triglycerides   . History of kidney stones   . Hyperlipidemia   . Hypertension    "lost wright and this went away" (11/14/2017)  . Obesity   . Type II diabetes mellitus (HCC)    "dx'd 08/2017" (11/14/2017)  . Urethral stricture    Meds reviewed.   Current Outpatient Medications (Endocrine & Metabolic):  Marland Kitchen.  JARDIANCE 25 MG TABS tablet, Take 25 mg by mouth daily. .  metFORMIN (GLUCOPHAGE) 500 MG tablet, Take 1 tablet (500 mg total) 2 (two) times daily by mouth.  Current Outpatient Medications (Cardiovascular):  Marland Kitchen.  Icosapent Ethyl (VASCEPA) 1 g CAPS, Take 4 g by mouth daily. .  metoprolol tartrate (LOPRESSOR) 50 MG tablet, Take 1 tablet (50 mg total) by mouth 2 (two) times daily. .  rosuvastatin (CRESTOR) 10 MG tablet, Take 1 tablet (10 mg total) by mouth daily.   Current Outpatient Medications (Analgesics):  .  aspirin EC 325 MG EC tablet, Take 1 tablet (325 mg total) by mouth daily.   Current Outpatient Medications (Other):  Marland Kitchen.  ALPRAZolam (XANAX) 0.5 MG tablet, Take 1 tablet (0.5 mg total) by mouth at bedtime as needed for anxiety. Marland Kitchen.  escitalopram (LEXAPRO) 10 MG tablet, Take 10 mg by mouth daily.   HT: Ht Readings from Last 1 Encounters:  01/31/18 5' 11.75" (1.822 m)    WT: Wt Readings from Last 5 Encounters:  01/31/18 241 lb 6.5 oz (109.5 kg)  01/25/18 236 lb 12.8 oz (107.4 kg)  01/11/18 231 lb 9.6 oz (105.1 kg)  12/17/17 230 lb (104.3 kg)  12/03/17 228 lb 1.9 oz (103.5 kg)     Body mass index is 32.97 kg/m.   Current tobacco use? No  Labs:  Lipid Panel     Component Value  Date/Time   CHOL 232 (H) 10/13/2014 0500   TRIG 837 (H) 10/13/2014 0500   HDL 26 (L) 10/13/2014 0500   CHOLHDL 8.9 10/13/2014 0500   VLDL UNABLE TO CALCULATE IF TRIGLYCERIDE OVER 400 mg/dL 40/34/742509/13/2016 [id number]   LDLCALC UNABLE TO CALCULATE IF TRIGLYCERIDE OVER 400 mg/dL 87/56/433209/13/2016 [id number]    Lab Results  Component Value Date   HGBA1C 8.0 (H) 10/13/2014   CBG (last 3)  No results for input(s): GLUCAP in the last 72 hours.  Nutrition Note Spoke with pt. Nutrition plan and goals reviewed with pt. Pt is following Step 2 of the Therapeutic Lifestyle Changes diet. Pt wants to lose wt. Pt has been trying to lose wt by meal prepping and planning, batch cooking. Wt loss tips reviewed (label reading, how to build a healthy plate, portion sizes, eating frequently across the day). Pt has made a lot of changes, air fry or grills his food instead of frying, eats non-starchy veggies at lunch and dinner meals, has started eating 3 meals a day with one snack. Set goal with patient to focus on portion sizes of protein and carbohydrates, and to weigh and measure foods for accuracy. Pt has Type  2 Diabetes. Last A1c indicates blood glucose well-controlled. This Clinical research associate went over Diabetes Education test results. Pt checks CBG's 1 times a day. Fasting CBG's reportedly 135-150's mg/dL. Per discussion, pt does not use canned/convenience foods often. Pt does not add salt to food. Pt does not eat out frequently. Pt expressed understanding of the information reviewed. Pt aware of nutrition education classes offered and would like to attend nutrition classes.  Nutrition Diagnosis ? Food-and nutrition-related knowledge deficit related to lack of exposure to information as related to diagnosis of: ? CVD ? Type 2 Diabetes ? Obese  I = 30-34.9 related to excessive energy intake as evidenced by a Body mass index is 32.97 kg/m. ? Excessive sodium intake related to over consumption of processed food as evidenced by frequent consumption  of convenience food/ canned vegetables and eating out frequently. ? Limited adherence to nutrition-related recommendations related to poor understanding or disinterest as evidenced by food history. ? Not ready for lifestyle change related to denial of need for change as evidenced by reluctance to participate in encounter. ? Inappropriate intake of food fats related to food and nutrition-related knowledge deficit as evidenced by frequent consumption of  ? Increased energy expenditure related to increased energy requirements during COPD exacerbation as evidenced by BMI <20 and recent h/o wt loss. ? Unintentional wt loss related to fatigue during food preparation and decreased food intake as evidenced by wt loss of 5% in one month. ? Inadequate vitamin intake related to tobacco use as evidenced by pt reporting he/she smokes/uses  Packs/day.  Nutrition Intervention ? Pt's individual nutrition plan and goals reviewed with pt. ? Pt given handouts for: ? Nutrition I class ? Nutrition II class  ? Diabetes Blitz Class ? Diabetes Q & A class   Nutrition Goal(s):  ? Pt to identify and limit food sources of saturated fat, trans fat, refined carbohydrates and sodium ? Pt to identify food quantities necessary to achieve weight loss of 6-24 lbs. at graduation from cardiac rehab.  ? Improved blood glucose control as evidenced by pt's A1c trending from 8.0 toward less than 7.0. ? Pt able to name foods that affect blood glucose.  Plan:  ? Pt to attend nutrition classes ? Nutrition I ? Nutrition II ? Portion Distortion  ? Diabetes Blitz ? Diabetes Q & Ae determined ? Will provide client-centered nutrition education as part of interdisciplinary care ? Monitor and evaluate progress toward nutrition goal with team.   Ross Marcus, MS, RD, LDN 01/31/2018 11:04 AM

## 2018-01-31 NOTE — Progress Notes (Signed)
Cardiac Individual Treatment Plan  Patient Details  Name: Andre Ford MRN: [id number] Date of Birth: 02/28/1980 Referring Provider:     CARDI[facility]  Referring Provider  Dr. Excell Seltzerooper      Initial Encounter Date:    CARD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  ALPRAZolam (XANAX) 0.5 MG tablet, Take 1 tablet (0.5 mg total) by mouth at bedtime as needed for anxiety., Disp: 30 tablet, Rfl: 0 .  aspirin EC 325 MG EC tablet, Take 1 tablet (325 mg total) by mouth daily., Disp: , Rfl:  .  escitalopram (LEXAPRO) 10 MG tablet, Take 10 mg by mouth daily., Disp: , Rfl: 3 .  Icosapent Ethyl (VASCEPA) 1 g CAPS, Take 4 g by mouth daily., Disp: , Rfl:  .  JARDIANCE 25 MG TABS tablet, Take 25 mg by mouth daily., Disp: , Rfl: 6 .  metFORMIN (GLUCOPHAGE) 500 MG tablet, Take 1 tablet (500 mg total) 2 (two) times daily by mouth., Disp: 60 tablet, Rfl: 1 .  metoprolol tartrate (LOPRESSOR) 50 MG tablet, Take 1 tablet (50 mg total) by mouth 2 (two) times daily., Disp: 180 tablet, Rfl: 3 .  rosuvastatin (CRESTOR) 10 MG tablet, Take 1 tablet (10 mg total) by mouth daily., Disp: 90 tablet, Rfl: 3  Past Medical History: Past Medical History:  Diagnosis Date  . Anxiety   . Arthritis    "qwhere" (11/14/2017)  . At risk for sleep apnea    STOP-BANG= 6     SENT TO PCP 08-05-2014  . Chest pain, pleuritic 01/10/2018  . Chronic pancreatitis (HCC)   . Coronary artery disease   . Depression   . High triglycerides   . History of kidney stones   . Hyperlipidemia   . Hypertension    "lost wright and this went away" (11/14/2017)  . Obesity   . Type II diabetes mellitus (HCC)    "dx'd 08/2017" (11/14/2017)  . Urethral stricture     Tobacco  Use: Social History   Tobacco Use  Smoking Status Former Smoker  . Packs/day: 1.00  . Years: 10.00  . Pack years: 10.00  . Types: Cigarettes  . Last attempt to quit: 08/05/2011  . Years since quitting: 6.4  Smokeless Tobacco Former NeurosurgeonUser  . Types: Snuff    Labs: Recent Review Flowsheet Data    Labs for ITP Cardiac and Pulmonary Rehab Latest Ref Rng & Units 11/16/2017 11/16/2017 11/16/2017 11/16/2017 11/17/2017   Cholestrol 0 - 200 mg/dL - - - - -   LDLCALC 0 - 99 mg/dL - - - - -   HDL >40>40 mg/dL - - - - -   Trlycerides <150 mg/dL - - - - -   Hemoglobin A1c 4.8 - 5.6 % - - - - -   PHART 7.350 - 7.450 7.349(L) 7.332(L) 7.344(L) - -   PCO2ART 32.0 - 48.0 mmHg 42.4 46.8 44.5 - -   HCO3 20.0 - 28.0 mmol/L 23.5 24.8 24.2 - -   TCO2 22 - 32 mmol/L 25 26 25 24 26    ACIDBASEDEF 0.0 - 2.0 mmol/L 2.0 1.0 2.0 - -   O2SAT % 97.0 99.0 99.0 - -      Capillary Blood Glucose: Lab Results  Component Value Date   GLUCAP 153 (H) 11/21/2017   GLUCAP 104 (H) 11/21/2017   GLUCAP 115 (H) 11/20/2017   GLUCAP 105 (H) 11/20/2017   GLUCAP 174 (H) 11/20/2017     Exercise Target Goals: Exercise Program Goal: Individual exercise prescription set using results from initial 6 min walk test and THRR while considering  patient's activity barriers and safety.   Exercise Prescription Goal: Initial exercise prescription builds to 30-45 minutes a day of aerobic activity, 2-3 days per week.  Home exercise guidelines will be given to patient during program as part of exercise prescription that the participant will acknowledge.  Activity Barriers & Risk Stratification: Activity Barriers & Cardiac Risk Stratification - 01/31/18 0953      Activity Barriers & Cardiac Risk Stratification   Activity Barriers  None    Cardiac Risk Stratification  High       6 Minute Walk: 6 Minute Walk    Row Name 01/31/18 0952         6 Minute Walk   Phase  Initial     Distance  2008 feet     Walk Time  6 minutes      # of Rest Breaks  0     MPH  3.8     METS  5.6     RPE  11     VO2 Peak  19.63     Symptoms  No     Resting HR  73 bpm     Resting BP  112/70     Resting Oxygen Saturation   97 %     Exercise Oxygen Saturation  during 6 min walk  98 %     Max Ex. HR  103 bpm     Max Ex. BP  122/78     2 Minute Post BP  122/70        Oxygen Initial Assessment:   Oxygen Re-Evaluation:   Oxygen Discharge (Final Oxygen Re-Evaluation):   Initial Exercise Prescription: Initial Exercise Prescription - 01/31/18 1000      Date of Initial Exercise RX and Referring Provider   Date  01/31/18    Referring Provider  Dr. Excell Seltzerooper    Expected Discharge Date  02/08/18      Treadmill   MPH  3.6    Grade  2    Minutes  10      Bike   Level  2.4    Minutes  10    METs  5.12      Rower   Level  4    Watts  65    Minutes  10    METs  5.6      Prescription Details   Frequency (times per week)  3    Duration  Progress to 30 minutes of continuous aerobic without signs/symptoms of physical distress      Intensity   THRR 40-80% of Max Heartrate  73-146    Ratings of Perceived Exertion  11-13      Progression   Progression  Continue to progress workloads to maintain intensity without signs/symptoms of physical distress.      Resistance Training   Training Prescription  Yes    Weight  5 lbs.     Reps  10-15       Perform Capillary Blood Glucose checks as needed.  Exercise Prescription Changes:   Exercise Comments:   Exercise Goals and Review: Exercise Goals    Row Name 01/31/18 [phone]  Exercise Goals   Increase Physical Activity  Yes       Intervention  Provide advice, education, support and counseling about physical activity/exercise needs.;Develop an individualized exercise prescription for aerobic and resistive training based on initial evaluation findings, risk stratification, comorbidities and participant's personal goals.       Expected Outcomes  Short Term:  Attend rehab on a regular basis to increase amount of physical activity.       Increase Strength and Stamina  Yes       Intervention  Provide advice, education, support and counseling about physical activity/exercise needs.;Develop an individualized exercise prescription for aerobic and resistive training based on initial evaluation findings, risk stratification, comorbidities and participant's personal goals.       Expected Outcomes  Short Term: Increase workloads from initial exercise prescription for resistance, speed, and METs.       Able to understand and use rate of perceived exertion (RPE) scale  Yes       Intervention  Provide education and explanation on how to use RPE scale       Expected Outcomes  Short Term: Able to use RPE daily in rehab to express subjective intensity level;Long Term:  Able to use RPE to guide intensity level when exercising independently       Knowledge and understanding of Target Heart Rate Range (THRR)  Yes       Intervention  Provide education and explanation of THRR including how the numbers were predicted and where they are located for reference       Expected Outcomes  Long Term: Able to use THRR to govern intensity when exercising independently;Short Term: Able to state/look up THRR;Short Term: Able to use daily as guideline for intensity in rehab       Able to check pulse independently  Yes       Intervention  Provide education and demonstration on how to check pulse in carotid and radial arteries.;Review the importance of being able to check your own pulse for safety during independent exercise       Expected Outcomes  Short Term: Able to explain why pulse checking is important during independent exercise;Long Term: Able to check pulse independently and accurately       Understanding of Exercise Prescription  Yes       Intervention  Provide education, explanation, and written materials on patient's individual exercise prescription       Expected Outcomes  Short  Term: Able to explain program exercise prescription;Long Term: Able to explain home exercise prescription to exercise independently          Exercise Goals Re-Evaluation :   Discharge Exercise Prescription (Final Exercise Prescription Changes):   Nutrition:  Target Goals: Understanding of nutrition guidelines, daily intake of sodium 1500mg , cholesterol 200mg , calories 30% from fat and 7% or less from saturated fats, daily to have 5 or more servings of fruits and vegetables.  Biometrics: Pre Biometrics - 01/31/18 0954      Pre Biometrics   Height  5' 11.75" (1.822 m)    Weight  109.5 kg    Waist Circumference  43 inches    Hip Circumference  44 inches    Waist to Hip Ratio  0.98 %    BMI (Calculated)  32.99    Triceps Skinfold  25 mm    % Body Fat  31.5 %    Grip Strength  42 kg    Flexibility  12 in    Single Leg Stand  30 seconds        Nutrition Therapy Plan and Nutrition Goals:   Nutrition Assessments:   Nutrition Goals Re-Evaluation:   Nutrition Goals Re-Evaluation:   Nutrition Goals Discharge (Final Nutrition Goals Re-Evaluation):   Psychosocial: Target Goals: Acknowledge presence or absence of significant depression and/or stress, maximize coping skills, provide positive support system. Participant is able to verbalize types and ability to use techniques and skills needed for reducing stress and depression.  Initial Review & Psychosocial Screening: Initial Psych Review & Screening - 01/31/18 1034      Initial Review   Current issues with  None Identified      Family Dynamics   Good Support System?  Yes   Andre Ford has his wife and friends for support     Barriers   Psychosocial barriers to participate in program  There are no identifiable barriers or psychosocial needs.      Screening Interventions   Interventions  Encouraged to exercise       Quality of Life Scores: Quality of Life - 01/31/18 0934      Quality of Life   Select  Quality of  Life      Quality of Life Scores   Health/Function Pre  22.13 %    Socioeconomic Pre  28.07 %    Psych/Spiritual Pre  21.57 %    Family Pre  26.4 %    GLOBAL Pre  23.87 %      Scores of 19 and below usually indicate a poorer quality of life in these areas.  A difference of  2-3 points is a clinically meaningful difference.  A difference of 2-3 points in the total score of the Quality of Life Index has been associated with significant improvement in overall quality of life, self-image, physical symptoms, and general health in studies assessing change in quality of life.  PHQ-9: Recent Review Flowsheet Data    There is no flowsheet data to display.     Interpretation of Total Score  Total Score Depression Severity:  1-4 = Minimal depression, 5-9 = Mild depression, 10-14 = Moderate depression, 15-19 = Moderately severe depression, 20-27 = Severe depression   Psychosocial Evaluation and Intervention:   Psychosocial Re-Evaluation:   Psychosocial Discharge (Final Psychosocial Re-Evaluation):   Vocational Rehabilitation: Provide vocational rehab assistance to qualifying candidates.   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 Participant will state understanding/return demonstration of topics presented.  Learning Barriers/Preferences: Learning Barriers/Preferences - 01/31/18 1037      Learning Barriers/Preferences   Learning Preferences  Audio;Computer/Internet;Individual Instruction;Skilled Demonstration;Pictoral;Verbal Instruction;Video       Education Topics: Count Your Pulse:  -Group instruction provided by verbal instruction,  demonstration, patient participation and written materials to support subject.  Instructors address importance of being able to find your pulse and how to count your pulse when at home without a heart monitor.  Patients get hands on experience counting their pulse with staff help and individually.   Heart Attack, Angina, and Risk Factor Modification:  -Group instruction provided by verbal instruction, video, and written materials to support subject.  Instructors address signs and symptoms of angina and heart attacks.  Also discuss risk factors for heart disease and how to make changes to improve heart health risk factors.   Functional Fitness:  -Group instruction provided by verbal instruction, demonstration, patient participation, and written materials to support subject.  Instructors address safety measures for doing things around the house.  Discuss how to get up and down off the floor, how to pick things up properly, how to safely get out of a chair without assistance, and balance training.   Meditation and Mindfulness:  -Group instruction provided by verbal instruction, patient participation, and written materials to support subject.  Instructor addresses importance of mindfulness and meditation practice to help reduce stress and improve awareness.  Instructor also leads participants through a meditation exercise.    Stretching for Flexibility and Mobility:  -Group instruction provided by verbal instruction, patient participation, and written materials to support subject.  Instructors lead participants through series of stretches that are designed to increase flexibility thus improving mobility.  These stretches are additional exercise for major muscle groups that are typically performed during regular warm up and cool down.   Hands Only CPR:  -Group verbal, video, and participation provides a basic overview of AHA guidelines for community CPR. Role-play of emergencies allow participants  the opportunity to practice calling for help and chest compression technique with discussion of AED use.   Hypertension: -Group verbal and written instruction that provides a basic overview of hypertension including the most recent diagnostic guidelines, risk factor reduction with self-care instructions and medication management.    Nutrition I class: Heart Healthy Eating:  -Group instruction provided by PowerPoint slides, verbal discussion, and written materials to support subject matter. The instructor gives an explanation and review of the Therapeutic Lifestyle Changes diet recommendations, which includes a discussion on lipid goals, dietary fat, sodium, fiber, plant stanol/sterol esters, sugar, and the components of a well-balanced, healthy diet.   Nutrition II class: Lifestyle Skills:  -Group instruction provided by PowerPoint slides, verbal discussion, and written materials to support subject matter. The instructor gives an explanation and review of label reading, grocery shopping for heart health, heart healthy recipe modifications, and ways to make healthier choices when eating out.   Diabetes Question & Answer:  -Group instruction provided by PowerPoint slides, verbal discussion, and written materials to support subject matter. The instructor gives an explanation and review of diabetes co-morbidities, pre- and post-prandial blood glucose goals, pre-exercise blood glucose goals, signs, symptoms, and treatment of hypoglycemia and hyperglycemia, and foot care basics.   Diabetes Blitz:  -Group instruction provided by PowerPoint slides, verbal discussion, and written materials to support subject matter. The instructor gives an explanation and review of the physiology behind type 1 and type 2 diabetes, diabetes medications and rational behind using different medications, pre- and post-prandial blood glucose recommendations and Hemoglobin A1c goals, diabetes diet, and exercise including blood  glucose guidelines for exercising safely.    Portion Distortion:  -Group instruction provided by PowerPoint slides, verbal discussion, written materials, and food models to support subject matter. The instructor gives an explanation of serving size versus portion size, changes in portions sizes over the last 20 years, and what consists of a serving from each food group.   Stress Management:  -Group instruction provided by verbal instruction, video, and written materials to support subject matter.  Instructors review role of stress in heart disease and how to cope with stress positively.     Exercising on Your Own:  -Group instruction provided by verbal instruction, power point, and written materials  to support subject.  Instructors discuss benefits of exercise, components of exercise, frequency and intensity of exercise, and end points for exercise.  Also discuss use of nitroglycerin and activating EMS.  Review options of places to exercise outside of rehab.  Review guidelines for sex with heart disease.   Cardiac Drugs I:  -Group instruction provided by verbal instruction and written materials to support subject.  Instructor reviews cardiac drug classes: antiplatelets, anticoagulants, beta blockers, and statins.  Instructor discusses reasons, side effects, and lifestyle considerations for each drug class.   Cardiac Drugs II:  -Group instruction provided by verbal instruction and written materials to support subject.  Instructor reviews cardiac drug classes: angiotensin converting enzyme inhibitors (ACE-I), angiotensin II receptor blockers (ARBs), nitrates, and calcium channel blockers.  Instructor discusses reasons, side effects, and lifestyle considerations for each drug class.   Anatomy and Physiology of the Circulatory System:  Group verbal and written instruction and models provide basic cardiac anatomy and physiology, with the coronary electrical and arterial systems. Review of: AMI,  Angina, Valve disease, Heart Failure, Peripheral Artery Disease, Cardiac Arrhythmia, Pacemakers, and the ICD.   Other Education:  -Group or individual verbal, written, or video instructions that support the educational goals of the cardiac rehab program.   Holiday Eating Survival Tips:  -Group instruction provided by PowerPoint slides, verbal discussion, and written materials to support subject matter. The instructor gives patients tips, tricks, and techniques to help them not only survive but enjoy the holidays despite the onslaught of food that accompanies the holidays.   Knowledge Questionnaire Score: Knowledge Questionnaire Score - 01/31/18 0934      Knowledge Questionnaire Score   Pre Score  21/24       Core Components/Risk Factors/Patient Goals at Admission: Personal Goals and Risk Factors at Admission - 01/31/18 0956      Core Components/Risk Factors/Patient Goals on Admission    Weight Management  Yes;Weight Maintenance;Weight Loss    Intervention  Weight Management: Develop a combined nutrition and exercise program designed to reach desired caloric intake, while maintaining appropriate intake of nutrient and fiber, sodium and fats, and appropriate energy expenditure required for the weight goal.;Weight Management: Provide education and appropriate resources to help participant work on and attain dietary goals.;Weight Management/Obesity: Establish reasonable short term and long term weight goals.    Admit Weight  241 lb 6.5 oz (109.5 kg)    Expected Outcomes  Short Term: Continue to assess and modify interventions until short term weight is achieved;Long Term: Adherence to nutrition and physical activity/exercise program aimed toward attainment of established weight goal;Weight Maintenance: Understanding of the daily nutrition guidelines, which includes 25-35% calories from fat, 7% or less cal from saturated fats, less than 200mg  cholesterol, less than 1.5gm of sodium, & 5 or more  servings of fruits and vegetables daily;Weight Loss: Understanding of general recommendations for a balanced deficit meal plan, which promotes 1-2 lb weight loss per week and includes a negative energy balance of [phone] kcal/d;Understanding recommendations for meals to include 15-35% energy as protein, 25-35% energy from fat, 35-60% energy from carbohydrates, less than 200mg  of dietary cholesterol, 20-35 gm of total fiber daily;Understanding of distribution of calorie intake throughout the day with the consumption of 4-5 meals/snacks    Diabetes  Yes    Intervention  Provide education about signs/symptoms and action to take for hypo/hyperglycemia.;Provide education about proper nutrition, including hydration, and aerobic/resistive exercise prescription along with prescribed medications to achieve blood glucose in normal ranges: Fasting glucose 65-99 mg/dL  Expected Outcomes  Short Term: Participant verbalizes understanding of the signs/symptoms and immediate care of hyper/hypoglycemia, proper foot care and importance of medication, aerobic/resistive exercise and nutrition plan for blood glucose control.;Long Term: Attainment of HbA1C < 7%.    Hypertension  Yes    Intervention  Provide education on lifestyle modifcations including regular physical activity/exercise, weight management, moderate sodium restriction and increased consumption of fresh fruit, vegetables, and low fat dairy, alcohol moderation, and smoking cessation.;Monitor prescription use compliance.    Expected Outcomes  Short Term: Continued assessment and intervention until BP is < 140/61mm HG in hypertensive participants. < 130/63mm HG in hypertensive participants with diabetes, heart failure or chronic kidney disease.;Long Term: Maintenance of blood pressure at goal levels.    Lipids  Yes    Intervention  Provide education and support for participant on nutrition & aerobic/resistive exercise along with prescribed medications to achieve  LDL 70mg , HDL >40mg .    Expected Outcomes  Short Term: Participant states understanding of desired cholesterol values and is compliant with medications prescribed. Participant is following exercise prescription and nutrition guidelines.;Long Term: Cholesterol controlled with medications as prescribed, with individualized exercise RX and with personalized nutrition plan. Value goals: LDL < 70mg , HDL > 40 mg.    Stress  Yes    Intervention  Offer individual and/or small group education and counseling on adjustment to heart disease, stress management and health-related lifestyle change. Teach and support self-help strategies.;Refer participants experiencing significant psychosocial distress to appropriate mental health specialists for further evaluation and treatment. When possible, include family members and significant others in education/counseling sessions.    Expected Outcomes  Short Term: Participant demonstrates changes in health-related behavior, relaxation and other stress management skills, ability to obtain effective social support, and compliance with psychotropic medications if prescribed.;Long Term: Emotional wellbeing is indicated by absence of clinically significant psychosocial distress or social isolation.       Core Components/Risk Factors/Patient Goals Review:    Core Components/Risk Factors/Patient Goals at Discharge (Final Review):    ITP Comments: ITP Comments    Row Name 01/31/18 0826 01/31/18 1032         ITP Comments  Dr. Armanda Magic, Mediacl Director  Dr. Armanda Magic, Medical Director         Comments: Andre Ford attended orientation from 08:27 to 09:54 to review rules and guidelines for program. Completed 6 minute walk test, Intitial ITP, and exercise prescription.  VSS. Telemetry-Sinus Rhythm.  Asymptomatic. Andre Ford is only going to attend 3 exercise sessions due to insurance co pays.Gladstone Lighter, RN,BSN 01/31/2018 11:11 AM

## 2018-02-04 ENCOUNTER — Encounter (HOSPITAL_COMMUNITY)
Admission: RE | Admit: 2018-02-04 | Discharge: 2018-02-04 | Disposition: A | Discharge status: Home / Self Care | Payer: BLUE CROSS/BLUE SHIELD | Source: Ambulatory Visit | Attending: Cardiovascular Disease | Admitting: Cardiovascular Disease

## 2018-02-04 ENCOUNTER — Encounter (HOSPITAL_COMMUNITY): Payer: Self-pay

## 2018-02-04 DIAGNOSIS — Z951 Presence of aortocoronary bypass graft: Secondary | ICD-10-CM

## 2018-02-04 LAB — GLUCOSE, CAPILLARY
Glucose-Capillary: 161 mg/dL — ABNORMAL HIGH (ref 70–99)
Glucose-Capillary: 171 mg/dL — ABNORMAL HIGH (ref 70–99)

## 2018-02-04 NOTE — Progress Notes (Signed)
Daily Session Note  Patient Details  Name: Andre Ford MRN: [id number] Date of Birth: Jul 08, 1980 Referring Provider:   Flowsheet Row CARDIAC REHAB PHASE II ORIENTATION from 01/31/2018 in [place]  Referring Provider  Dr. Burt Knack      Encounter Date: 02/04/2018  Check In: Session Check In - 02/04/18 0919    Check-In          Supervising physician immediately available to respond to emergencies  [facility] immediately available    Physician(s)  Dr. Tana Coast    Location  [facility]    Staff Present  Dorma Russell, MS,ACSM CEP, Exercise Physiologist;Tara Karle Starch, RN, Marga Melnick, RN, BSN; , RN, BSN    Medication changes reported      No    Fall or balance concerns reported     No    Tobacco Cessation  No Change    Warm-up and Cool-down  Performed as group-led Higher education careers adviser Performed  Yes    VAD Patient?  No    PAD/SET Patient?  No        Pain Assessment          Currently in Pain?  No/denies           Capillary Blood Glucose: Results for orders placed or performed during the hospital encounter of 02/04/18 (from the past 24 hour(s))  Glucose, capillary     Status: Abnormal   Collection Time: 02/04/18  8:30 AM  Result Value Ref Range   Glucose-Capillary 161 (H) 70 - 99 mg/dL  Glucose, capillary     Status: Abnormal   Collection Time: 02/04/18  9:20 AM  Result Value Ref Range   Glucose-Capillary 171 (H) 70 - 99 mg/dL    Exercise Prescription Changes - 02/04/18 0900    Response to Exercise          Blood Pressure (Admit)  112/70    Blood Pressure (Exercise)  148/70    Blood Pressure (Exit)  102/60    Heart Rate (Admit)  78 bpm    Heart Rate (Exercise)  110 bpm    Heart Rate (Exit)  88 bpm    Rating of Perceived Exertion (Exercise)  14    Perceived Dyspnea (Exercise)  0    Symptoms  None    Comments  Pt oriented to exercise equipment    Duration  Progress to 30 minutes of   aerobic without signs/symptoms of physical distress    Intensity  THRR unchanged        Progression          Progression  Continue to progress workloads to maintain intensity without signs/symptoms of physical distress.    Average METs  4.96        Resistance Training          Training Prescription  Yes    Weight  5lbs    Reps  10-15    Time  10 Minutes        Interval Training          Interval Training  No        Treadmill          MPH  3.6    Grade  2    Minutes  10    METs  4.75        Bike          Level  2.4    Minutes  10  METs  5.14        Rower          Level  4    Watts  45    Minutes  10    METs  5           Social History   Tobacco Use  Smoking Status Former Smoker  . Packs/day: 1.00  . Years: 10.00  . Pack years: 10.00  . Types: Cigarettes  . Last attempt to quit: 08/05/2011  . Years since quitting: 6.5  Smokeless Tobacco Former Systems developer  . Types: Snuff    Goals Met:  Exercise tolerated well  Goals Unmet:  Not Applicable  Comments:   Pt started cardiac rehab today.  Pt tolerated light exercise without difficulty. VSS, telemetry-sinus rhythm, asymptomatic.  Medication list reconciled. Pt denies barriers to medicaiton compliance.  PSYCHOSOCIAL ASSESSMENT:  PHQ-. Pt exhibits positive coping skills, hopeful outlook with supportive family. No psychosocial needs identified at this time, no psychosocial interventions necessary.  Pt oriented to exercise equipment and routine.  Understanding verbalized. Andi Hence, RN, BSN Cardiac Pulmonary Rehab      Dr. Fransico Him is Medical Director for Cardiac Rehab at [facility].

## 2018-02-06 NOTE — Progress Notes (Signed)
Cardiac Individual Treatment Plan  Patient Details  Name: Andre Ford MRN: [id number] Date of Birth: 04-22-1986 Referring Provider:   Flowsheet Row CARDIAC REHAB PHASE II ORIENTATION from 01/31/2018 in [facility] CARDIAC [facility]  Referring Provider  Dr. 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  ALPRAZolam (XANAX) 0.5 MG tablet, Take 1 tablet (0.5 mg total) by mouth at bedtime as needed for anxiety., Disp: 30 tablet, Rfl: 0 .  aspirin EC 325 MG EC tablet, Take 1 tablet (325 mg total) by mouth daily., Disp: , Rfl:  .  escitalopram (LEXAPRO) 10 MG tablet, Take 10 mg by mouth daily., Disp: , Rfl: 3 .  Icosapent Ethyl (VASCEPA) 1 g CAPS, Take 4 g by mouth daily., Disp: , Rfl:  .  JARDIANCE 25 MG TABS tablet, Take 25 mg by mouth daily., Disp: , Rfl: 6 .  metFORMIN (GLUCOPHAGE) 500 MG tablet, Take 1 tablet (500 mg total) 2 (two) times daily by mouth., Disp: 60 tablet, Rfl: 1 .  metoprolol tartrate (LOPRESSOR) 50 MG tablet, Take 1 tablet (50 mg total) by mouth 2 (two) times daily., Disp: 180 tablet, Rfl: 3 .  rosuvastatin (CRESTOR) 10 MG tablet, Take 1 tablet (10 mg total) by mouth daily., Disp: 90 tablet, Rfl: 3  Past Medical History: Past Medical History:  Diagnosis Date  . Anxiety   . Arthritis    "qwhere" (11/14/2017)  . At risk for sleep apnea    STOP-BANG= 6     SENT TO PCP 08-05-2014  . Chest pain, pleuritic 01/10/2018  . Chronic pancreatitis (HCC)   . Coronary artery disease   . Depression   . High triglycerides   . History of kidney stones   . Hyperlipidemia   . Hypertension    "lost wright and this went away" (11/14/2017)  . Obesity   . Type II diabetes mellitus (HCC)    "dx'd 08/2017" (11/14/2017)  . Urethral  stricture     Tobacco Use: Social History   Tobacco Use  Smoking Status Former Smoker  . Packs/day: 1.00  . Years: 10.00  . Pack years: 10.00  . Types: Cigarettes  . Last attempt to quit: 08/05/2011  . Years since quitting: 6.5  Smokeless Tobacco Former Neurosurgeon  . Types: Snuff    Labs: Recent Review Flowsheet Data    Labs for ITP Cardiac and Pulmonary Rehab Latest Ref Rng & Units 11/16/2017 11/16/2017 11/16/2017 11/16/2017 11/17/2017   Cholestrol 0 - 200 mg/dL - - - - -   LDLCALC 0 - 99 mg/dL - - - - -   HDL >95 mg/dL - - - - -   Trlycerides <150 mg/dL - - - - -   Hemoglobin A1c 4.8 - 5.6 % - - - - -   PHART 7.350 - 7.450 7.349(L) 7.332(L) 7.344(L) - -   PCO2ART 32.0 - 48.0 mmHg 42.4 46.8 44.5 - -   HCO3 20.0 - 28.0 mmol/L 23.5 24.8 24.2 - -   TCO2 22 - 32 mmol/L 25 26 25 24 26    ACIDBASEDEF 0.0 - 2.0 mmol/L 2.0 1.0 2.0 - -   O2SAT % 97.0 99.0 99.0 - -      Capillary Blood Glucose: Lab Results  Component Value Date   GLUCAP 171 (H) 02/04/2018   GLUCAP 161 (H) 02/04/2018   GLUCAP 153 (H) 11/21/2017   GLUCAP 104 (H) 11/21/2017   GLUCAP 115 (H) 11/20/2017     Exercise Target Goals: Exercise Program Goal: Individual exercise prescription set using results from initial 6 min walk test and THRR while considering  patient's activity barriers and safety.   Exercise Prescription Goal: Initial exercise prescription builds to 30-45 minutes a day of aerobic activity, 2-3 days per week.  Home exercise guidelines will be given to patient during program as part of exercise prescription that the participant will acknowledge.  Activity Barriers & Risk Stratification: Activity Barriers & Cardiac Risk Stratification - 01/31/18 0953    Activity Barriers & Cardiac Risk Stratification          Activity Barriers  None    Cardiac Risk Stratification  High           6 Minute Walk: 6 Minute Walk    6 Minute Walk    Row Name 01/31/18 8119   Phase  Initial   Distance  2008 feet    Walk Time  6 minutes   # of Rest Breaks  0   MPH  3.8   METS  5.6   RPE  11   VO2 Peak  19.63   Symptoms  No   Resting HR  73 bpm   Resting BP  112/70   Resting Oxygen Saturation   97 %   Exercise Oxygen Saturation  during 6 min walk  98 %   Max Ex. HR  103 bpm   Max Ex. BP  122/78   2 Minute Post BP  122/70          Oxygen Initial Assessment:   Oxygen Re-Evaluation:   Oxygen Discharge (Final Oxygen Re-Evaluation):   Initial Exercise Prescription: Initial Exercise Prescription - 01/31/18 1000    Date of Initial Exercise RX and Referring Provider          Date  01/31/18    Referring Provider  Dr. Excell Seltzer    Expected Discharge Date  02/08/18        Treadmill          MPH  3.6    Grade  2    Minutes  10        Bike          Level  2.4    Minutes  10    METs  5.12        Rower          Level  4    Watts  65    Minutes  10    METs  5.6        Prescription Details          Frequency (times per week)  3    Duration  Progress to 30 minutes of continuous aerobic without signs/symptoms of physical distress        Intensity          THRR 40-80% of Max Heartrate  73-146    Ratings of Perceived Exertion  11-13        Progression          Progression  Continue to progress workloads to maintain intensity without signs/symptoms of physical distress.        Resistance Training          Training Prescription  Yes    Weight  5  lbs.     Reps  10-15           Perform Capillary Blood Glucose checks as needed.  Exercise Prescription Changes: Exercise Prescription Changes    Response to Exercise    Row Name 02/04/18 0900   Blood Pressure (Admit)  112/70   Blood Pressure (Exercise)  148/70   Blood Pressure (Exit)  102/60   Heart Rate (Admit)  78 bpm   Heart Rate (Exercise)  110 bpm   Heart Rate (Exit)  88 bpm   Rating of Perceived Exertion (Exercise)  14   Perceived Dyspnea (Exercise)  0   Symptoms  None   Comments  Pt oriented to exercise  equipment   Duration  Progress to 30 minutes of  aerobic without signs/symptoms of physical distress   Intensity  THRR unchanged       Progression    Row Name 02/04/18 0900   Progression  Continue to progress workloads to maintain intensity without signs/symptoms of physical distress.   Average METs  4.96       Resistance Training    Row Name 02/04/18 0900   Training Prescription  Yes   Weight  5lbs   Reps  10-15   Time  10 Minutes       Interval Training    Row Name 02/04/18 0900   Interval Training  No       Treadmill    Row Name 02/04/18 0900   MPH  3.6   Grade  2   Minutes  10   METs  4.75       Bike    Row Name 02/04/18 0900   Level  2.4   Minutes  10   METs  5.14       Rower    Row Name 02/04/18 0900   Level  4   Watts  45   Minutes  10   METs  5          Exercise Comments: Exercise Comments    Row Name 02/04/18 0933   Exercise Comments  Pt's first day of exercise. Pt oriented to exercise equipment. Pt responded well to workloads. Will continue to monitor and progress pt.      Exercise Goals and Review: Exercise Goals    Exercise Goals    Row Name 01/31/18 0953   Increase Physical Activity  Yes   Intervention  Provide advice, education, support and counseling about physical activity/exercise needs.;Develop an individualized exercise prescription for aerobic and resistive training based on initial evaluation findings, risk stratification, comorbidities and participant's personal goals.   Expected Outcomes  Short Term: Attend rehab on a regular basis to increase amount of physical activity.   Increase Strength and Stamina  Yes   Intervention  Provide advice, education, support and counseling about physical activity/exercise needs.;Develop an individualized exercise prescription for aerobic and resistive training based on initial evaluation findings, risk stratification, comorbidities and participant's personal goals.   Expected Outcomes  Short  Term: Increase workloads from initial exercise prescription for resistance, speed, and METs.   Able to understand and use rate of perceived exertion (RPE) scale  Yes   Intervention  Provide education and explanation on how to use RPE scale   Expected Outcomes  Short Term: Able to use RPE daily in rehab to express subjective intensity level;Long Term:  Able to use RPE to guide intensity level when exercising independently   Knowledge and understanding of Target Heart Rate Range (THRR)  Yes   Intervention  Provide education and explanation of THRR including how the numbers were predicted and where they are located for reference   Expected Outcomes  Long Term: Able to use THRR to govern intensity when exercising independently;Short Term: Able to state/look up THRR;Short Term: Able to use daily as guideline for intensity in rehab   Able to check pulse independently  Yes   Intervention  Provide education and demonstration on how to check pulse in carotid and radial arteries.;Review the importance of being able to check your own pulse for safety during independent exercise   Expected Outcomes  Short Term: Able to explain why pulse checking is important during independent exercise;Long Term: Able to check pulse independently and accurately   Understanding of Exercise Prescription  Yes   Intervention  Provide education, explanation, and written materials on patient's individual exercise prescription   Expected Outcomes  Short Term: Able to explain program exercise prescription;Long Term: Able to explain home exercise prescription to exercise independently          Exercise Goals Re-Evaluation :   Discharge Exercise Prescription (Final Exercise Prescription Changes): Exercise Prescription Changes - 02/04/18 0900    Response to Exercise          Blood Pressure (Admit)  112/70    Blood Pressure (Exercise)  148/70    Blood Pressure (Exit)  102/60    Heart Rate (Admit)  78 bpm    Heart Rate  (Exercise)  110 bpm    Heart Rate (Exit)  88 bpm    Rating of Perceived Exertion (Exercise)  14    Perceived Dyspnea (Exercise)  0    Symptoms  None    Comments  Pt oriented to exercise equipment    Duration  Progress to 30 minutes of  aerobic without signs/symptoms of physical distress    Intensity  THRR unchanged        Progression          Progression  Continue to progress workloads to maintain intensity without signs/symptoms of physical distress.    Average METs  4.96        Resistance Training          Training Prescription  Yes    Weight  5lbs    Reps  10-15    Time  10 Minutes        Interval Training          Interval Training  No        Treadmill          MPH  3.6    Grade  2    Minutes  10    METs  4.75        Bike          Level  2.4    Minutes  10    METs  5.14        Rower          Level  4    Watts  45    Minutes  10    METs  5           Nutrition:  Target Goals: Understanding of nutrition guidelines, daily intake of sodium 1500mg , cholesterol 200mg , calories 30% from fat and 7% or less from saturated fats, daily to have 5 or more servings of fruits and vegetables.  Biometrics: Pre Biometrics - 01/31/18 0954    Pre Biometrics          Height  5' 11.75" (1.822 m)    Weight  109.5 kg    Waist Circumference  43 inches    Hip Circumference  44 inches    Waist to Hip Ratio  0.98 %    BMI (Calculated)  32.99    Triceps Skinfold  25 mm    % Body Fat  31.5 %    Grip Strength  42 kg    Flexibility  12 in    Single Leg Stand  30 seconds            Nutrition Therapy Plan and Nutrition Goals: Nutrition Therapy & Goals - 01/31/18 1109    Nutrition Therapy          Diet  heart healthy, carb modified         Personal Nutrition Goals          Nutrition Goal  Pt to identify and limit food sources of saturated fat, trans fat, refined carbohydrates and sodium    Personal Goal #2  Pt to identify food quantities necessary to achieve  weight loss of 6-24 lbs. at graduation from cardiac rehab.     Personal Goal #3  Improved blood glucose control as evidenced by pt's A1c trending from 8.0 toward less than 7.0.    Personal Goal #4  Pt able to name foods that affect blood glucose.        Intervention Plan          Intervention  Prescribe, educate and counsel regarding individualized specific dietary modifications aiming towards targeted core components such as weight, hypertension, lipid management, diabetes, heart failure and other comorbidities.    Expected Outcomes  Short Term Goal: Understand basic principles of dietary content, such as calories, fat, sodium, cholesterol and nutrients.;Long Term Goal: Adherence to prescribed nutrition plan.           Nutrition Assessments: Nutrition Assessments - 01/31/18 1110    MEDFICTS Scores          Pre Score  36           Nutrition Goals Re-Evaluation: Nutrition Goals Re-Evaluation    Goals    Row Name 01/31/18 1109   Current Weight  241 lb 6.5 oz (109.5 kg)          Nutrition Goals Re-Evaluation: Nutrition Goals Re-Evaluation    Goals    Row Name 01/31/18 1109   Current Weight  241 lb 6.5 oz (109.5 kg)          Nutrition Goals Discharge (Final Nutrition Goals Re-Evaluation): Nutrition Goals Re-Evaluation - 01/31/18 1109    Goals          Current Weight  241 lb 6.5 oz (109.5 kg)           Psychosocial: Target Goals: Acknowledge presence or absence of significant depression and/or stress, maximize coping skills, provide positive support system. Participant is able to verbalize types and ability to use techniques and skills needed for reducing stress and depression.  Initial Review & Psychosocial Screening: Initial Psych Review & Screening - 01/31/18 1034    Initial Review          Current issues with  None Identified        Family Dynamics          Good Support System?  Yes   Feliz Beam has his wife and friends for support       Barriers           Psychosocial barriers to participate  in program  There are no identifiable barriers or psychosocial needs.        Screening Interventions          Interventions  Encouraged to exercise           Quality of Life Scores: Quality of Life - 01/31/18 0934    Quality of Life          Select  Quality of Life        Quality of Life Scores          Health/Function Pre  22.13 %    Socioeconomic Pre  28.07 %    Psych/Spiritual Pre  21.57 %    Family Pre  26.4 %    GLOBAL Pre  23.87 %          Scores of 19 and below usually indicate a poorer quality of life in these areas.  A difference of  2-3 points is a clinically meaningful difference.  A difference of 2-3 points in the total score of the Quality of Life Index has been associated with significant improvement in overall quality of life, self-image, physical symptoms, and general health in studies assessing change in quality of life.  PHQ-9: Recent Review Flowsheet Data    Depression screen [employer] 2/9 02/04/2018   Decreased Interest 0   Down, Depressed, Hopeless 0   PHQ - 2 Score 0     Interpretation of Total Score  Total Score Depression Severity:  1-4 = Minimal depression, 5-9 = Mild depression, 10-14 = Moderate depression, 15-19 = Moderately severe depression, 20-27 = Severe depression   Psychosocial Evaluation and Intervention: Psychosocial Evaluation - 02/04/18 1023    Psychosocial Evaluation & Interventions          Interventions  Encouraged to exercise with the program and follow exercise prescription    Comments  no psychosocial needs identified, no interventions necessary.  pt enjoys spending time with his wife and 4 children ages 46-63 years old. pt works as Higher education careers adviser for [employer]  pt will exhibit positive outlook with good coping skills.     Continue Psychosocial Services   No Follow up required           Psychosocial Re-Evaluation:   Psychosocial Discharge (Final Psychosocial  Re-Evaluation):   Vocational Rehabilitation: Provide vocational rehab assistance to qualifying candidates.   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 Participant will state understanding/return demonstration of topics presented.  Learning Barriers/Preferences: Learning Barriers/Preferences - 01/31/18 1037    Learning Barriers/Preferences          Learning Preferences  Audio;Computer/Internet;Individual Instruction;Skilled Demonstration;Pictoral;Verbal Instruction;Video           Education Topics: Count Your Pulse:  -Group instruction provided by verbal instruction, demonstration, patient participation and written materials to support subject.  Instructors address importance of being able to find your pulse and how to count your pulse when at home without a heart monitor.  Patients get hands on experience counting their pulse with staff help and individually.   Heart Attack, Angina, and Risk Factor Modification:  -  Group instruction provided by verbal instruction, video, and written materials to support subject.  Instructors address signs and symptoms of angina and heart attacks.    Also discuss risk factors for heart disease and how to make changes to improve heart health risk factors.   Functional Fitness:  -Group instruction provided by verbal instruction, demonstration, patient participation, and written materials to support subject.  Instructors address safety measures for doing things around the house.  Discuss how to get up and down off the floor, how to pick things up properly, how to safely get out of a chair without assistance,  and balance training.   Meditation and Mindfulness:  -Group instruction provided by verbal instruction, patient participation, and written materials to support subject.  Instructor addresses importance of mindfulness and meditation practice to help reduce stress and improve awareness.  Instructor also leads participants through a meditation exercise.    Stretching for Flexibility and Mobility:  -Group instruction provided by verbal instruction, patient participation, and written materials to support subject.  Instructors lead participants through series of stretches that are designed to increase flexibility thus improving mobility.  These stretches are additional exercise for major muscle groups that are typically performed during regular warm up and cool down.   Hands Only CPR:  -Group verbal, video, and participation provides a basic overview of AHA guidelines for community CPR. Role-play of emergencies allow participants the opportunity to practice calling for help and chest compression technique with discussion of AED use.   Hypertension: -Group verbal and written instruction that provides a basic overview of hypertension including the most recent diagnostic guidelines, risk factor reduction with self-care instructions and medication management.    Nutrition I class: Heart Healthy Eating:  -Group instruction provided by PowerPoint slides, verbal discussion, and written materials to support subject matter. The instructor gives an explanation and review of the Therapeutic Lifestyle Changes diet recommendations, which includes a discussion on lipid goals, dietary fat, sodium, fiber, plant stanol/sterol esters, sugar, and the components of a well-balanced, healthy diet.   Nutrition II class: Lifestyle Skills:  -Group instruction provided by PowerPoint slides, verbal discussion, and written materials to support subject matter. The instructor gives an explanation and review of label reading,  grocery shopping for heart health, heart healthy recipe modifications, and ways to make healthier choices when eating out.   Diabetes Question & Answer:  -Group instruction provided by PowerPoint slides, verbal discussion, and written materials to support subject matter. The instructor gives an explanation and review of diabetes co-morbidities, pre- and post-prandial blood glucose goals, pre-exercise blood glucose goals, signs, symptoms, and treatment of hypoglycemia and hyperglycemia, and foot care basics.   Diabetes Blitz:  -Group instruction provided by PowerPoint slides, verbal discussion, and written materials to support subject matter. The instructor gives an explanation and review of the physiology behind type 1 and type 2 diabetes, diabetes medications and rational behind using different medications, pre- and post-prandial blood glucose recommendations and Hemoglobin A1c goals, diabetes diet, and exercise including blood glucose guidelines for exercising safely.    Portion Distortion:  -Group instruction provided by PowerPoint slides, verbal discussion, written materials, and food models to support subject matter. The instructor gives an explanation of serving size versus portion size, changes in portions sizes over the last 20 years, and what consists of a serving from each food group.   Stress Management:  -Group instruction provided by verbal instruction, video, and written materials to support subject matter.  Instructors review role of stress in heart disease  and how to cope with stress positively.     Exercising on Your Own:  -Group instruction provided by verbal instruction, power point, and written materials to support subject.  Instructors discuss benefits of exercise, components of exercise, frequency and intensity of exercise, and end points for exercise.  Also discuss use of nitroglycerin and activating EMS.  Review options of places to exercise outside of rehab.  Review  guidelines for sex with heart disease.   Cardiac Drugs I:  -Group instruction provided by verbal instruction and written materials to support subject.  Instructor reviews cardiac drug classes: antiplatelets, anticoagulants, beta blockers, and statins.  Instructor discusses reasons, side effects, and lifestyle considerations for each drug class.   Cardiac Drugs II:  -Group instruction provided by verbal instruction and written materials to support subject.  Instructor reviews cardiac drug classes: angiotensin converting enzyme inhibitors (ACE-I), angiotensin II receptor blockers (ARBs), nitrates, and calcium channel blockers.  Instructor discusses reasons, side effects, and lifestyle considerations for each drug class.   Anatomy and Physiology of the Circulatory System:  Group verbal and written instruction and models provide basic cardiac anatomy and physiology, with the coronary electrical and arterial systems. Review of: AMI, Angina, Valve disease, Heart Failure, Peripheral Artery Disease, Cardiac Arrhythmia, Pacemakers, and the ICD.   Other Education:  -Group or individual verbal, written, or video instructions that support the educational goals of the cardiac rehab program.   Holiday Eating Survival Tips:  -Group instruction provided by PowerPoint slides, verbal discussion, and written materials to support subject matter. The instructor gives patients tips, tricks, and techniques to help them not only survive but enjoy the holidays despite the onslaught of food that accompanies the holidays.   Knowledge Questionnaire Score: Knowledge Questionnaire Score - 01/31/18 0934    Knowledge Questionnaire Score          Pre Score  21/24           Core Components/Risk Factors/Patient Goals at Admission: Personal Goals and Risk Factors at Admission - 01/31/18 0956    Core Components/Risk Factors/Patient Goals on Admission           Weight Management  Yes;Weight Maintenance;Weight Loss     Intervention  Weight Management: Develop a combined nutrition and exercise program designed to reach desired caloric intake, while maintaining appropriate intake of nutrient and fiber, sodium and fats, and appropriate energy expenditure required for the weight goal.;Weight Management: Provide education and appropriate resources to help participant work on and attain dietary goals.;Weight Management/Obesity: Establish reasonable short term and long term weight goals.    Admit Weight  241 lb 6.5 oz (109.5 kg)    Expected Outcomes  Short Term: Continue to assess and modify interventions until short term weight is achieved;Long Term: Adherence to nutrition and physical activity/exercise program aimed toward attainment of established weight goal;Weight Maintenance: Understanding of the daily nutrition guidelines, which includes 25-35% calories from fat, 7% or less cal from saturated fats, less than 200mg  cholesterol, less than 1.5gm of sodium, & 5 or more servings of fruits and vegetables daily;Weight Loss: Understanding of general recommendations for a balanced deficit meal plan, which promotes 1-2 lb weight loss per week and includes a negative energy balance of [phone] kcal/d;Understanding recommendations for meals to include 15-35% energy as protein, 25-35% energy from fat, 35-60% energy from carbohydrates, less than 200mg  of dietary cholesterol, 20-35 gm of total fiber daily;Understanding of distribution of calorie intake throughout the day with the consumption of 4-5 meals/snacks    Diabetes  Yes    Intervention  Provide education about signs/symptoms and action to take for hypo/hyperglycemia.;Provide education about proper nutrition, including hydration, and aerobic/resistive exercise prescription along with prescribed medications to achieve blood glucose in normal ranges: Fasting glucose 65-99 mg/dL    Expected Outcomes  Short Term: Participant verbalizes understanding of the signs/symptoms and immediate  care of hyper/hypoglycemia, proper foot care and importance of medication, aerobic/resistive exercise and nutrition plan for blood glucose control.;Long Term: Attainment of HbA1C < 7%.    Hypertension  Yes    Intervention  Provide education on lifestyle modifcations including regular physical activity/exercise, weight management, moderate sodium restriction and increased consumption of fresh fruit, vegetables, and low fat dairy, alcohol moderation, and smoking cessation.;Monitor prescription use compliance.    Expected Outcomes  Short Term: Continued assessment and intervention until BP is < 140/1190mm HG in hypertensive participants. < 130/5380mm HG in hypertensive participants with diabetes, heart failure or chronic kidney disease.;Long Term: Maintenance of blood pressure at goal levels.    Lipids  Yes    Intervention  Provide education and support for participant on nutrition & aerobic/resistive exercise along with prescribed medications to achieve LDL 70mg , HDL >40mg .    Expected Outcomes  Short Term: Participant states understanding of desired cholesterol values and is compliant with medications prescribed. Participant is following exercise prescription and nutrition guidelines.;Long Term: Cholesterol controlled with medications as prescribed, with individualized exercise RX and with personalized nutrition plan. Value goals: LDL < 70mg , HDL > 40 mg.    Stress  Yes    Intervention  Offer individual and/or small group education and counseling on adjustment to heart disease, stress management and health-related lifestyle change. Teach and support self-help strategies.;Refer participants experiencing significant psychosocial distress to appropriate mental health specialists for further evaluation and treatment. When possible, include family members and significant others in education/counseling sessions.    Expected Outcomes  Short Term: Participant demonstrates changes in health-related behavior, relaxation  and other stress management skills, ability to obtain effective social support, and compliance with psychotropic medications if prescribed.;Long Term: Emotional wellbeing is indicated by absence of clinically significant psychosocial distress or social isolation.           Core Components/Risk Factors/Patient Goals Review:  Goals and Risk Factor Review    Core Components/Risk Factors/Patient Goals Review    Row Name 02/04/18 1024   Personal Goals Review  Weight Management/Obesity;Diabetes;Hypertension;Lipids;Stress   Review  pt with multiple CAD RF demonstrates willingness to participate in CR activities for limited time due to high copay. pt personal goals are to learn safe exercise guidelines primarily apporpirate workload and heart rate parameters for healling.  pt is newly diagnosed diabetic checking daily CBG,     Expected Outcomes  pt will participate in CR exercise,nutrition adn lifestyle modification opportunities.           Core Components/Risk Factors/Patient Goals at Discharge (Final Review):  Goals and Risk Factor Review - 02/04/18 1024    Core Components/Risk Factors/Patient Goals Review          Personal Goals Review  Weight Management/Obesity;Diabetes;Hypertension;Lipids;Stress    Review  pt with multiple CAD RF demonstrates willingness to participate in CR activities for limited time due to high copay. pt personal goals are to learn safe exercise guidelines primarily apporpirate workload and heart rate parameters for healling.  pt is newly diagnosed diabetic checking daily CBG,      Expected Outcomes  pt will participate in CR exercise,nutrition adn lifestyle modification opportunities.  ITP Comments: ITP Comments    Row Name 01/31/18 0826 01/31/18 1032 02/04/18 1033   ITP Comments  Dr. Armanda Magic, Mediacl Director  Dr. Armanda Magic, Medical Director  pt started group exercise program. pt tolerated light activity without difficulty. pt oriented to exercise  equipment and safety routine.  understanding verbalized.       Comments:

## 2018-02-08 ENCOUNTER — Ambulatory Visit (HOSPITAL_COMMUNITY): Payer: BLUE CROSS/BLUE SHIELD

## 2018-02-11 ENCOUNTER — Ambulatory Visit (HOSPITAL_COMMUNITY): Payer: BLUE CROSS/BLUE SHIELD

## 2018-02-13 ENCOUNTER — Ambulatory Visit (HOSPITAL_COMMUNITY): Payer: BLUE CROSS/BLUE SHIELD

## 2018-02-15 ENCOUNTER — Telehealth (HOSPITAL_COMMUNITY): Payer: Self-pay | Admitting: Cardiac Rehabilitation

## 2018-02-15 ENCOUNTER — Ambulatory Visit (HOSPITAL_COMMUNITY): Payer: BLUE CROSS/BLUE SHIELD

## 2018-02-15 NOTE — Telephone Encounter (Signed)
PC to pt to discuss CRPII participation. Pt states he is not interested in participating. He has returned to work and plans to exercise on his own. Deveron Furlong, RN, BSN Cardiac Pulmonary Rehab

## 2018-02-18 ENCOUNTER — Ambulatory Visit (HOSPITAL_COMMUNITY): Payer: BLUE CROSS/BLUE SHIELD

## 2018-02-20 ENCOUNTER — Ambulatory Visit (HOSPITAL_COMMUNITY): Payer: BLUE CROSS/BLUE SHIELD

## 2018-02-22 ENCOUNTER — Ambulatory Visit (HOSPITAL_COMMUNITY): Payer: BLUE CROSS/BLUE SHIELD

## 2018-02-25 ENCOUNTER — Ambulatory Visit (HOSPITAL_COMMUNITY): Payer: BLUE CROSS/BLUE SHIELD

## 2018-02-27 ENCOUNTER — Ambulatory Visit (HOSPITAL_COMMUNITY): Payer: BLUE CROSS/BLUE SHIELD

## 2018-03-01 ENCOUNTER — Ambulatory Visit (HOSPITAL_COMMUNITY): Payer: BLUE CROSS/BLUE SHIELD

## 2018-03-01 NOTE — Addendum Note (Signed)
Encounter addended by: Enid Skeens, RD on: 03/01/2018 4:16 PM  Actions taken: Flowsheet data copied forward, Visit Navigator Flowsheet section accepted

## 2018-03-04 ENCOUNTER — Ambulatory Visit (HOSPITAL_COMMUNITY): Payer: BLUE CROSS/BLUE SHIELD

## 2018-03-06 ENCOUNTER — Ambulatory Visit (HOSPITAL_COMMUNITY): Payer: BLUE CROSS/BLUE SHIELD

## 2018-03-07 NOTE — Addendum Note (Signed)
Encounter addended by: Robyne Peers, RN on: 03/07/2018 2:23 PM  Actions taken: Episode resolved

## 2018-03-07 NOTE — Addendum Note (Signed)
Encounter addended by: Robyne Peersion, Joann H, RN on: 03/07/2018 2:23 PM  Actions taken: Visit Navigator Flowsheet section accepted, Clinical Note Signed

## 2018-03-07 NOTE — Progress Notes (Signed)
Discharge Progress Report  Patient Details  Name: Andre Ford MRN: [id number] Date of Birth: 1980-12-26 Referring Provider:   Flowsheet Row CARDIAC REHAB PHASE II ORIENTATION from 01/31/2018 in [facility]ARD[facility]  Referring Provider  Dr. Excell Seltzer       Number of Visits: 2   Reason for Discharge:  Early Exit:  Back to work  Smoking History:  Social History   Tobacco Use  Smoking Status Former Smoker  . Packs/day: 1.00  . Years: 10.00  . Pack years: 10.00  . Types: Cigarettes  . Last attempt to quit: 08/05/2011  . Years since quitting: 6.5  Smokeless Tobacco Former Neurosurgeon  . 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 Excell Seltzer    Expected Discharge Date  02/08/18        Treadmill          MPH  3.6    Grade  2    Minutes  10        Bike          Level  2.4    Minutes  10    METs  5.12        Rower          Level  4    Watts  65    Minutes  10    METs  5.6        Prescription Details          Frequency (times per week)  3    Duration  Progress to 30 minutes of continuous aerobic without signs/symptoms of physical distress        Intensity          THRR 40-80% of Max Heartrate  73-146    Ratings of Perceived Exertion  11-13        Progression          Progression  Continue to progress workloads to maintain intensity without signs/symptoms of physical distress.        Resistance Training          Training Prescription  Yes    Weight  5 lbs.     Reps  10-15           Discharge Exercise Prescription (Final Exercise Prescription Changes): Exercise Prescription Changes - 02/04/18 0900    Response to Exercise          Blood Pressure (Admit)  112/70    Blood Pressure (Exercise)  148/70    Blood Pressure (Exit)   102/60    Heart Rate (Admit)  78 bpm    Heart Rate (Exercise)  110 bpm    Heart Rate (Exit)  88 bpm    Rating of Perceived Exertion (Exercise)  14    Perceived Dyspnea (Exercise)  0    Symptoms  None    Comments  Pt oriented to exercise equipment    Duration  Progress to 30 minutes of  aerobic without signs/symptoms of physical distress    Intensity  THRR unchanged        Progression          Progression  Continue to progress workloads to maintain intensity without signs/symptoms of physical  distress.    Average METs  4.96        Resistance Training          Training Prescription  Yes    Weight  5lbs    Reps  10-15    Time  10 Minutes        Interval Training          Interval Training  No        Treadmill          MPH  3.6    Grade  2    Minutes  10    METs  4.75        Bike          Level  2.4    Minutes  10    METs  5.14        Rower          Level  4    Watts  45    Minutes  10    METs  5           Functional Capacity: 6 Minute Walk    6 Minute Walk    Row Name 01/31/18 0952   Phase  Initial   Distance  2008 feet   Walk Time  6 minutes   # of Rest Breaks  0   MPH  3.8   METS  5.6   RPE  11   VO2 Peak  19.63   Symptoms  No   Resting HR  73 bpm   Resting BP  112/70   Resting Oxygen Saturation   97 %   Exercise Oxygen Saturation  during 6 min walk  98 %   Max Ex. HR  103 bpm   Max Ex. BP  122/78   2 Minute Post BP  122/70          Psychological, QOL, Others - Outcomes: PHQ 2/9: Depression screen PHQ 2/9 02/04/2018  Decreased Interest 0  Down, Depressed, Hopeless 0  PHQ - 2 Score 0    Quality of Life: Quality of Life - 01/31/18 0934    Quality of Life          Select  Quality of Life        Quality of Life Scores          Health/Function Pre  22.13 %    Socioeconomic Pre  28.07 %    Psych/Spiritual Pre  21.57 %    Family Pre  26.4 %    GLOBAL Pre  23.87 %           Personal Goals: Goals established at  orientation with interventions provided to work toward goal. Personal Goals and Risk Factors at Admission - 01/31/18 0956    Core Components/Risk Factors/Patient Goals on Admission           Weight Management  Yes;Weight Maintenance;Weight Loss    Intervention  Weight Management: Develop a combined nutrition and exercise program designed to reach desired caloric intake, while maintaining appropriate intake of nutrient and fiber, sodium and fats, and appropriate energy expenditure required for the weight goal.;Weight Management: Provide education and appropriate resources to help participant work on and attain dietary goals.;Weight Management/Obesity: Establish reasonable short term and long term weight goals.    Admit Weight  241 lb 6.5 oz (109.5 kg)    Expected Outcomes  Short Term: Continue to assess and modify interventions until short term weight is achieved;Long Term: Adherence  to nutrition and physical activity/exercise program aimed toward attainment of established weight goal;Weight Maintenance: Understanding of the daily nutrition guidelines, which includes 25-35% calories from fat, 7% or less cal from saturated fats, less than 200mg  cholesterol, less than 1.5gm of sodium, & 5 or more servings of fruits and vegetables daily;Weight Loss: Understanding of general recommendations for a balanced deficit meal plan, which promotes 1-2 lb weight loss per week and includes a negative energy balance of [phone] kcal/d;Understanding recommendations for meals to include 15-35% energy as protein, 25-35% energy from fat, 35-60% energy from carbohydrates, less than 200mg  of dietary cholesterol, 20-35 gm of total fiber daily;Understanding of distribution of calorie intake throughout the day with the consumption of 4-5 meals/snacks    Diabetes  Yes    Intervention  Provide education about signs/symptoms and action to take for hypo/hyperglycemia.;Provide education about proper nutrition, including hydration, and  aerobic/resistive exercise prescription along with prescribed medications to achieve blood glucose in normal ranges: Fasting glucose 65-99 mg/dL    Expected Outcomes  Short Term: Participant verbalizes understanding of the signs/symptoms and immediate care of hyper/hypoglycemia, proper foot care and importance of medication, aerobic/resistive exercise and nutrition plan for blood glucose control.;Long Term: Attainment of HbA1C < 7%.    Hypertension  Yes    Intervention  Provide education on lifestyle modifcations including regular physical activity/exercise, weight management, moderate sodium restriction and increased consumption of fresh fruit, vegetables, and low fat dairy, alcohol moderation, and smoking cessation.;Monitor prescription use compliance.    Expected Outcomes  Short Term: Continued assessment and intervention until BP is < 140/75mm HG in hypertensive participants. < 130/18mm HG in hypertensive participants with diabetes, heart failure or chronic kidney disease.;Long Term: Maintenance of blood pressure at goal levels.    Lipids  Yes    Intervention  Provide education and support for participant on nutrition & aerobic/resistive exercise along with prescribed medications to achieve LDL 70mg , HDL >40mg .    Expected Outcomes  Short Term: Participant states understanding of desired cholesterol values and is compliant with medications prescribed. Participant is following exercise prescription and nutrition guidelines.;Long Term: Cholesterol controlled with medications as prescribed, with individualized exercise RX and with personalized nutrition plan. Value goals: LDL < 70mg , HDL > 40 mg.    Stress  Yes    Intervention  Offer individual and/or small group education and counseling on adjustment to heart disease, stress management and health-related lifestyle change. Teach and support self-help strategies.;Refer participants experiencing significant psychosocial distress to appropriate mental health  specialists for further evaluation and treatment. When possible, include family members and significant others in education/counseling sessions.    Expected Outcomes  Short Term: Participant demonstrates changes in health-related behavior, relaxation and other stress management skills, ability to obtain effective social support, and compliance with psychotropic medications if prescribed.;Long Term: Emotional wellbeing is indicated by absence of clinically significant psychosocial distress or social isolation.            Personal Goals Discharge: Goals and Risk Factor Review    Core Components/Risk Factors/Patient Goals Review    Row Name 02/04/18 1024   Personal Goals Review  Weight Management/Obesity;Diabetes;Hypertension;Lipids;Stress   Review  pt with multiple CAD RF demonstrates willingness to participate in CR activities for limited time due to high copay. pt personal goals are to learn safe exercise guidelines primarily apporpirate workload and heart rate parameters for healling.  pt is newly diagnosed diabetic checking daily CBG,     Expected Outcomes  pt will participate in CR exercise,nutrition adn  lifestyle modification opportunities.           Exercise Goals and Review: Exercise Goals    Exercise Goals    Row Name 01/31/18 0953   Increase Physical Activity  Yes   Intervention  Provide advice, education, support and counseling about physical activity/exercise needs.;Develop an individualized exercise prescription for aerobic and resistive training based on initial evaluation findings, risk stratification, comorbidities and participant's personal goals.   Expected Outcomes  Short Term: Attend rehab on a regular basis to increase amount of physical activity.   Increase Strength and Stamina  Yes   Intervention  Provide advice, education, support and counseling about physical activity/exercise needs.;Develop an individualized exercise prescription for aerobic and resistive training  based on initial evaluation findings, risk stratification, comorbidities and participant's personal goals.   Expected Outcomes  Short Term: Increase workloads from initial exercise prescription for resistance, speed, and METs.   Able to understand and use rate of perceived exertion (RPE) scale  Yes   Intervention  Provide education and explanation on how to use RPE scale   Expected Outcomes  Short Term: Able to use RPE daily in rehab to express subjective intensity level;Long Term:  Able to use RPE to guide intensity level when exercising independently   Knowledge and understanding of Target Heart Rate Range (THRR)  Yes   Intervention  Provide education and explanation of THRR including how the numbers were predicted and where they are located for reference   Expected Outcomes  Long Term: Able to use THRR to govern intensity when exercising independently;Short Term: Able to state/look up THRR;Short Term: Able to use daily as guideline for intensity in rehab   Able to check pulse independently  Yes   Intervention  Provide education and demonstration on how to check pulse in carotid and radial arteries.;Review the importance of being able to check your own pulse for safety during independent exercise   Expected Outcomes  Short Term: Able to explain why pulse checking is important during independent exercise;Long Term: Able to check pulse independently and accurately   Understanding of Exercise Prescription  Yes   Intervention  Provide education, explanation, and written materials on patient's individual exercise prescription   Expected Outcomes  Short Term: Able to explain program exercise prescription;Long Term: Able to explain home exercise prescription to exercise independently          Exercise Goals Re-Evaluation:   Nutrition & Weight - Outcomes: Pre Biometrics - 01/31/18 0954    Pre Biometrics          Height  5' 11.75" (1.822 m)    Weight  109.5 kg    Waist Circumference  43 inches     Hip Circumference  44 inches    Waist to Hip Ratio  0.98 %    BMI (Calculated)  32.99    Triceps Skinfold  25 mm    % Body Fat  31.5 %    Grip Strength  42 kg    Flexibility  12 in    Single Leg Stand  30 seconds            Nutrition: Nutrition Therapy & Goals - 03/01/18 1616    Nutrition Therapy          Diet  heart healthy, carb modified         Personal Nutrition Goals          Nutrition Goal  Pt to identify and limit food sources of saturated fat, trans fat, refined  carbohydrates and sodium    Personal Goal #2  Pt to identify food quantities necessary to achieve weight loss of 6-24 lbs. at graduation from cardiac rehab.     Personal Goal #3  Improved blood glucose control as evidenced by pt's A1c trending from 8.0 toward less than 7.0.    Personal Goal #4  Pt able to name foods that affect blood glucose.        Intervention Plan          Intervention  Prescribe, educate and counsel regarding individualized specific dietary modifications aiming towards targeted core components such as weight, hypertension, lipid management, diabetes, heart failure and other comorbidities.    Expected Outcomes  Short Term Goal: Understand basic principles of dietary content, such as calories, fat, sodium, cholesterol and nutrients.;Long Term Goal: Adherence to prescribed nutrition plan.           Nutrition Discharge: Nutrition Assessments - 03/01/18 1613    MEDFICTS Scores          Pre Score  36    Post Score  --   pt did not return MEDFICTs post survey          Education Questionnaire Score: Knowledge Questionnaire Score - 01/31/18 0934    Knowledge Questionnaire Score          Pre Score  21/24           Goals reviewed with patient; copy given to patient.

## 2018-03-08 ENCOUNTER — Ambulatory Visit (HOSPITAL_COMMUNITY): Payer: BLUE CROSS/BLUE SHIELD

## 2018-03-11 ENCOUNTER — Ambulatory Visit (HOSPITAL_COMMUNITY): Payer: BLUE CROSS/BLUE SHIELD

## 2018-03-13 ENCOUNTER — Ambulatory Visit (HOSPITAL_COMMUNITY): Payer: BLUE CROSS/BLUE SHIELD

## 2018-03-15 ENCOUNTER — Ambulatory Visit (HOSPITAL_COMMUNITY): Payer: BLUE CROSS/BLUE SHIELD

## 2018-03-18 ENCOUNTER — Ambulatory Visit (HOSPITAL_COMMUNITY): Payer: BLUE CROSS/BLUE SHIELD

## 2018-03-20 ENCOUNTER — Ambulatory Visit (HOSPITAL_COMMUNITY): Payer: BLUE CROSS/BLUE SHIELD

## 2018-03-22 ENCOUNTER — Ambulatory Visit (HOSPITAL_COMMUNITY): Payer: BLUE CROSS/BLUE SHIELD

## 2018-03-25 ENCOUNTER — Ambulatory Visit (HOSPITAL_COMMUNITY): Payer: BLUE CROSS/BLUE SHIELD

## 2018-03-27 ENCOUNTER — Ambulatory Visit (HOSPITAL_COMMUNITY): Payer: BLUE CROSS/BLUE SHIELD

## 2018-03-29 ENCOUNTER — Ambulatory Visit (HOSPITAL_COMMUNITY): Payer: BLUE CROSS/BLUE SHIELD

## 2018-04-01 ENCOUNTER — Ambulatory Visit (HOSPITAL_COMMUNITY): Payer: BLUE CROSS/BLUE SHIELD

## 2018-04-03 ENCOUNTER — Ambulatory Visit (HOSPITAL_COMMUNITY): Payer: BLUE CROSS/BLUE SHIELD

## 2018-04-05 ENCOUNTER — Ambulatory Visit (HOSPITAL_COMMUNITY): Payer: BLUE CROSS/BLUE SHIELD

## 2018-04-08 ENCOUNTER — Ambulatory Visit (HOSPITAL_COMMUNITY): Payer: BLUE CROSS/BLUE SHIELD

## 2018-04-10 ENCOUNTER — Ambulatory Visit (HOSPITAL_COMMUNITY): Payer: BLUE CROSS/BLUE SHIELD

## 2018-04-12 ENCOUNTER — Ambulatory Visit (HOSPITAL_COMMUNITY): Payer: BLUE CROSS/BLUE SHIELD

## 2018-04-15 ENCOUNTER — Ambulatory Visit (HOSPITAL_COMMUNITY): Payer: BLUE CROSS/BLUE SHIELD

## 2018-04-17 ENCOUNTER — Ambulatory Visit (HOSPITAL_COMMUNITY): Payer: BLUE CROSS/BLUE SHIELD

## 2018-04-19 ENCOUNTER — Ambulatory Visit (HOSPITAL_COMMUNITY): Payer: BLUE CROSS/BLUE SHIELD

## 2018-04-22 ENCOUNTER — Ambulatory Visit (HOSPITAL_COMMUNITY): Payer: BLUE CROSS/BLUE SHIELD

## 2018-04-24 ENCOUNTER — Ambulatory Visit (HOSPITAL_COMMUNITY): Payer: BLUE CROSS/BLUE SHIELD

## 2018-04-26 ENCOUNTER — Ambulatory Visit (HOSPITAL_COMMUNITY): Payer: BLUE CROSS/BLUE SHIELD

## 2018-04-29 ENCOUNTER — Ambulatory Visit (HOSPITAL_COMMUNITY): Payer: BLUE CROSS/BLUE SHIELD

## 2018-05-01 ENCOUNTER — Ambulatory Visit (HOSPITAL_COMMUNITY): Payer: BLUE CROSS/BLUE SHIELD

## 2018-05-03 ENCOUNTER — Ambulatory Visit (HOSPITAL_COMMUNITY): Payer: BLUE CROSS/BLUE SHIELD

## 2018-05-06 ENCOUNTER — Ambulatory Visit (HOSPITAL_COMMUNITY): Payer: BLUE CROSS/BLUE SHIELD

## 2018-05-08 ENCOUNTER — Ambulatory Visit (HOSPITAL_COMMUNITY): Payer: BLUE CROSS/BLUE SHIELD

## 2018-05-10 ENCOUNTER — Ambulatory Visit (HOSPITAL_COMMUNITY): Payer: BLUE CROSS/BLUE SHIELD

## 2018-05-13 ENCOUNTER — Ambulatory Visit (HOSPITAL_COMMUNITY): Payer: BLUE CROSS/BLUE SHIELD

## 2018-08-22 ENCOUNTER — Other Ambulatory Visit: Payer: Self-pay

## 2018-08-27 ENCOUNTER — Other Ambulatory Visit: Payer: Self-pay

## 2018-08-27 MED ORDER — ASPIRIN 325 MG PO TBEC: 325.0000 mg | DELAYED_RELEASE_TABLET | Freq: Every day | ORAL | 0 refills | Status: DC

## 2018-12-01 ENCOUNTER — Other Ambulatory Visit: Payer: Self-pay | Admitting: Cardiology

## 2018-12-16 DIAGNOSIS — E1165 Type 2 diabetes mellitus with hyperglycemia: Secondary | ICD-10-CM | POA: Diagnosis not present

## 2018-12-16 DIAGNOSIS — E669 Obesity, unspecified: Secondary | ICD-10-CM | POA: Diagnosis not present

## 2018-12-16 DIAGNOSIS — I25810 Atherosclerosis of coronary artery bypass graft(s) without angina pectoris: Secondary | ICD-10-CM | POA: Diagnosis not present

## 2018-12-16 DIAGNOSIS — F419 Anxiety disorder, unspecified: Secondary | ICD-10-CM | POA: Diagnosis not present

## 2019-01-06 ENCOUNTER — Other Ambulatory Visit: Payer: Self-pay | Admitting: Physician Assistant

## 2019-04-07 DIAGNOSIS — I25810 Atherosclerosis of coronary artery bypass graft(s) without angina pectoris: Secondary | ICD-10-CM | POA: Diagnosis not present

## 2019-04-07 DIAGNOSIS — E1159 Type 2 diabetes mellitus with other circulatory complications: Secondary | ICD-10-CM | POA: Diagnosis not present

## 2019-04-07 DIAGNOSIS — I779 Disorder of arteries and arterioles, unspecified: Secondary | ICD-10-CM | POA: Diagnosis not present

## 2019-04-07 DIAGNOSIS — E669 Obesity, unspecified: Secondary | ICD-10-CM | POA: Diagnosis not present

## 2019-04-16 ENCOUNTER — Telehealth: Payer: Self-pay | Admitting: Cardiovascular Disease

## 2019-04-16 ENCOUNTER — Other Ambulatory Visit: Payer: Self-pay | Admitting: Cardiovascular Disease

## 2019-04-16 MED ORDER — ROSUVASTATIN CALCIUM 10 MG PO TABS: 10.0000 mg | ORAL_TABLET | Freq: Every day | ORAL | 0 refills | Status: DC

## 2019-04-16 NOTE — Telephone Encounter (Signed)
New message    *STAT* If patient is at the pharmacy, call can be transferred to refill team.   1. Which medications need to be refilled? (please list name of each medication and dose if known) rosuvastatin (CRESTOR) 10 MG tablet   metFORMIN (GLUCOPHAGE) 500 MG tablet  2. Which pharmacy/location (including street and city if local pharmacy) is medication to be sent to?[id number] [place], [place] - [address]  3. Do they need a 30 day or 90 day supply? 30

## 2019-04-16 NOTE — Telephone Encounter (Signed)
Pt' medication was sent to pt's pharmacy as requested. Confirmation received.  

## 2019-04-21 ENCOUNTER — Telehealth: Payer: BC Managed Care – PPO | Admitting: Cardiology

## 2019-04-21 ENCOUNTER — Other Ambulatory Visit: Payer: Self-pay

## 2019-04-21 ENCOUNTER — Telehealth: Payer: Self-pay | Admitting: *Deleted

## 2019-04-21 NOTE — Telephone Encounter (Signed)
Called to get pt ready for virtual appt 04/21/2019 and pt had a emergency at work and he had to fill in for a driver and was on the road to [place]. Pt rescheduled for 04/30/2019 with Vin Bhagat, PA-C.  Your [facility] team (Cardiologist [Heart Doctor] and Advanced Practice Provider [phone] Assistant; Nurse Practitioner]) has arranged for your next office appointment to be a virtual visit (also known as "Telehealth", "Telemedicine", "E-Visit").   We now offer virtual visits for all our patients.  This helps [place] to expand our ability to see patients in a timely and safe manner.  These visits are billed to your insurance just like traditional, in person, appointments.  Please review this IMPORTANT information about your upcoming appointment.   **PLEASE READ THE SECTION BELOW LABELED "CONSENT".**   **CALL OUR OFFICE WITH QUESTIONS.**   WHAT YOU NEED FOR YOUR VIRTUAL VISIT:  You will need a SmartPhone with microphone and video capability.  [If you are using MyChart to connect to your visit, it is also possible to use a desktop/laptop computer (with an Internet connection), as long as you have microphone and video capability.]  You will need to use Chrome, Edge or [employer] as your Wellsite geologist.  We highly recommend that you have a MyChart account as this will make connecting to your visit seamless.  A MyChart account not only allows you to connect to your provider for a virtual visit, but also allows you to see the results of all your tests, provider notes, medications and upcoming appointments.    A MyChart account is not absolutely necessary.  We can still complete your visit if you do not have one.    If you do not have a computer or SmartPhone with video/microphone capability or your Internet/cell service is weak on the day of your visit, we will do your visit by telephone.    A blood pressure cuff and scale are essential to collect your vital signs at home.  If you do not have  these and you are unable to obtain them, please contact our office so that we can make arrangements for you.  If you have a pulse oximeter, Apple watch, Kardia mobile device, etc, you can collect data from these devices as well to share with the provider for your visit.  These devices are not required for a virtual visit.    WHAT TO DO ON THE DAY OF YOUR APPOINTMENT: 30 minutes before your appointment:  Take your blood pressure, pulse or heart rate (if your blood pressure machine is able to collect it) and weight.  Write all these numbers down so you can give it to the nurse or medical assistant that calls.  Get all of the medications you currently take and put them where you will be sitting for the appointment.  The nurse/medical assistant will go over these with you when he/she calls.  15 minutes before your appointment:  You will receive a phone call from a nurse or medical assistant from our office.  The caller ID on your phone may indicate that the caller is either "[facility]" or "[place]".  However, the number may come across as spam.  Please turn off any spam blocker so you do not miss the call.  The nurse will:  Ask for your blood pressure, pulse, weight, height.  Go over all of your medications to make sure your chart is correct.  Review your allergies, smoking history, reason for appointment, etc.  Give you instructions on how to connect with the video platform or telephone.  IF YOUR VISIT IS BY TELEPHONE ONLY: After the nurse finishes getting you ready for the visit, your provider will call you on the phone number you provide to [place].   TO CONNECT WITH YOUR PROVIDER FOR YOUR APPOINTMENT (BY VIDEO): You will either connect with the provider with your MyChart account or (if you are not using MyChart) with a link sent to your SmartPhone by text message.  If you are using MyChart, see below.  If you are not using MyChart, the nurse will send you the text message during  the phone call.     If you are connecting with your MyChart account:   (The nurse that calls you will tell you when to do this):  You will log into your account. At the top of your home screen, you should see the following prompt that tells you to "Begin your video visit with . . . ".  Click the green button (BEGIN VISIT).    The next screen will say "It's time to start your video visit!" Click the green button (BEGIN VIDEO VISIT)    There may be a screen that appears that asks for permission to use your camera and/or microphone.  Click ALLOW.  This will open the browser where your appointment will take place.   You may see the message: "Welcome.  Waiting for the call to begin."  Or, you will see that the nurse or your provider are already "in" the room waiting on you.   If you are connecting with a link sent to your SmartPhone via text: The nurse that calls you will send the link by text message. Click on the link (it should look something like this):     If asked to give permission to use the camera and or microphone, click ALLOW This will open the browser where your appointment will take place.      You may see the message: "Welcome.  Waiting for the call to begin."  Or, you will see that the nurse or your provider are already "in" the room waiting on you.    The controls for your visit look like the picture below.  Please note that this is what the microphone and camera look like when they are ON.  If muted, they will have a line through them.    After the appointment: Once your provider leaves the appointment, he/she will go over instructions with the nurse/medical assistant.  If needed, this person will call you with any instructions, appointments, etc.   A copy of your After Visit Summary (AVS) will be available later that day in your MyChart account.  This document will have all of your instructions, medications, appointments, etc.  If you are not using MyChart, we will  mail it to your home.     *CONSENT FOR TELE-HEALTH VISIT - PLEASE REVIEW* By participating in the scheduled virtual visit (and any virtual visit scheduled within 365 days of the printing of this document), I agree to the following:   I hereby voluntarily request, consent and authorize [facility] and its employed or contracted physicians, physician assistants, nurse practitioners or other licensed health care professionals (the Practitioner), to provide me with telemedicine health care services (the "Services") as deemed necessary by the treating Practitioner. I acknowledge and consent to receive the Services by the Practitioner via telemedicine. I understand that the telemedicine visit will involve communicating with the  Practitioner through live Geographical information systems officer and the disclosure of certain medical information by electronic transmission. I acknowledge that I have been given the opportunity to request an in-person assessment or other available alternative prior to the telemedicine visit and am voluntarily participating in the telemedicine visit.  I understand that I have the right to withhold or withdraw my consent to the use of telemedicine in the course of my care at any time, without affecting my right to future care or treatment, and that the Practitioner or I may terminate the telemedicine visit at any time. I understand that I have the right to inspect all information obtained and/or recorded in the course of the telemedicine visit and may receive copies of available information for a reasonable fee.  I understand that some of the potential risks of receiving the Services via telemedicine include:  Marland Kitchen Delay or interruption in medical evaluation due to technological equipment failure or disruption; . Information transmitted may not be sufficient (e.g. poor resolution of images) to allow for appropriate medical decision making by the Practitioner; and/or  . In rare instances,  security protocols could fail, causing a breach of personal health information.  Furthermore, I acknowledge that it is my responsibility to provide information about my medical history, conditions and care that is complete and accurate to the best of my ability. I acknowledge that Practitioner's advice, recommendations, and/or decision may be based on factors not within their control, such as incomplete or inaccurate data provided by me or distortions of diagnostic images or specimens that may result from electronic transmissions. I understand that the practice of medicine is not an exact science and that Practitioner makes no warranties or guarantees regarding treatment outcomes. I acknowledge that a copy of this consent can be made available to me via my patient portal [facility] MyChart), or I can request a printed copy by calling the office of [facility].    I understand that my insurance will be billed for this visit.   I have read or had this consent read to me. . I understand the contents of this consent, which adequately explains the benefits and risks of the Services being provided via telemedicine.  . I have been provided ample opportunity to ask questions regarding this consent and the Services and have had my questions answered to my satisfaction. . I give my informed consent for the services to be provided through the use of telemedicine in my medical care

## 2019-04-21 NOTE — Progress Notes (Deleted)
{Choose 1 Note Type (Video or Telephone):[id number]}   The patient was identified using 2 identifiers.  Date:  04/21/2019   ID:  Andre Ford, DOB November 06, 1980, MRN [id number]  Patient Location: Home Provider Location: Home  PCP:  Adrian Prince, MD  Cardiologist:  Tonny Bollman, MD  Electrophysiologist:  None   Evaluation Performed:  Follow-Up Visit  Chief Complaint: Follow-up of CAD  History of Present Illness:    Andre Ford is a 39 y.o. male with a past medical history significant for CABG x 3w/ LIMA-LAD, RIMA-OM, L radial>PDA on 11/16/2017, HTN, HLD,and DM.    He was admitted to the hospital in 12/2017 with chest pain.  All cardiac testing was normal and his pain was felt to be musculoskeletal. Echocardiogram done on 01/11/2018 showed normal LVEF with no wall motion abnormalities, mild reduction in RV function, trivial posterior pericardial effusion.    Mr Andre Ford was last seen on 01/25/2018 by me for hospital follow-up.  He was no longer having any chest pain.  He was walking at work and feeling really well.  No formal exercise.  He did not attend cardiac rehab phase 2.  The patient is being seen today for follow-up.  LABS***  PCP  The patient {does/does not:200015} have symptoms concerning for COVID-19 infection (fever, chills, cough, or new shortness of breath).    Past Medical History:  Diagnosis Date  . Anxiety   . Arthritis    "qwhere" (11/14/2017)  . At risk for sleep apnea    STOP-BANG= 6     SENT TO PCP 08-05-2014  . Chest pain, pleuritic 01/10/2018  . Chronic pancreatitis (HCC)   . Coronary artery disease   . Depression   . High triglycerides   . History of kidney stones   . Hyperlipidemia   . Hypertension    "lost wright and this went away" (11/14/2017)  . Obesity   . Type II diabetes mellitus (HCC)    "dx'd 08/2017" (11/14/2017)  . Urethral stricture    Past Surgical History:  Procedure Laterality Date  . APPENDECTOMY  age 75  . CARDIAC  CATHETERIZATION    . CORONARY ARTERY BYPASS GRAFT N/A 11/16/2017   Procedure: CORONARY ARTERY BYPASS GRAFTING (CABG) x 3.;  Surgeon: Loreli Slot, MD;  Location: [facility] OR;  Service: Open Heart Surgery;  Laterality: N/A;  BILATERAL IMAS  . CYSTOSCOPY WITH URETHRAL DILATATION N/A 08/10/2014   Procedure: CYSTOSCOPY WITH URETHRAL DILATATION;  Surgeon: Malen Gauze, MD;  Location: [facility];  Service: Urology;  Laterality: N/A;  . CYSTOSCOPY WITH URETHRAL DILATATION N/A 11/16/2017   Procedure: CYSTOSCOPY WITH URETHRAL DILATATION;  Surgeon: Loreli Slot, MD;  Location: [facility] OR;  Service: Open Heart Surgery;  Laterality: N/A;  performed by Dr. Ronne Binning  . FRACTURE SURGERY    . KNEE ARTHROSCOPY WITH ANTERIOR CRUCIATE LIGAMENT (ACL) REPAIR Left 2000  . LEFT HEART CATH AND CORONARY ANGIOGRAPHY N/A 11/12/2017   Procedure: LEFT HEART CATH AND CORONARY ANGIOGRAPHY;  Surgeon: Elder Negus, MD;  Location: [facility] INVASIVE CV LAB;  Service: Cardiovascular;  Laterality: N/A;  . OPEN REDUCTION INTERNAL FIXATION (ORIF) HAND Right 2000   "boxer fx"  . PENILE BIOPSY N/A 08/10/2014   Procedure: Core biopsy of penile glands lesion;  Surgeon: Malen Gauze, MD;  Location: [facility];  Service: Urology;  Laterality: N/A;  . RADIAL ARTERY HARVEST Left 11/16/2017   Procedure: RADIAL ARTERY HARVEST;  Surgeon: Loreli Slot, MD;  Location: [facility]  OR;  Service: Open Heart Surgery;  Laterality: Left;  . TEE WITHOUT CARDIOVERSION N/A 11/16/2017   Procedure: TRANSESOPHAGEAL ECHOCARDIOGRAM (TEE);  Surgeon: Melrose Nakayama, MD;  Location: [place];  Service: Open Heart Surgery;  Laterality: N/A;  . URETHROPLASTY  2016     No outpatient medications have been marked as taking for the 04/21/19 encounter (Appointment) with Daune Perch, NP.     Allergies:   Patient has no known allergies.   Social History   Tobacco Use  . Smoking status: Former Smoker     Packs/day: 1.00    Years: 10.00    Pack years: 10.00    Types: Cigarettes    Quit date: 08/05/2011    Years since quitting: 7.7  . Smokeless tobacco: Former Systems developer    Types: Snuff  Substance Use Topics  . Alcohol use: Yes    Comment: 11/14/2017 "nothing since pancreatitis dx'd in 2016"  . Drug use: Not Currently     Family Hx: The patient's family history includes CAD in his paternal grandfather; Diabetes type I in his brother; Leukemia in his father; Lupus in his sister. There is no history of Heart disease.  ROS:   Please see the history of present illness.    *** All other systems reviewed and are negative.   Prior CV studies:   The following studies were reviewed today:  Echocardiogram 01/11/2018 Study Conclusions   - Left ventricle: The cavity size was normal. There was mild  concentric hypertrophy. Systolic function was normal. The  estimated ejection fraction was in the range of 50% to 55%. Wall  motion was normal; there were no regional wall motion  abnormalities. Left ventricular diastolic function parameters  were normal.  - Ventricular septum: These changes are consistent with a  post-thoracotomy state.  - Right ventricle: Systolic function was mildly reduced.  - Pericardium, extracardiac: A trivial pericardial effusion was  identified posterior to the heart.   Impressions:  - Normal LV EF without wall motion abnormalities. Mild reduction in  RV function. Trivial posterior pericardial effusion. Echo  contrast used to better define wall motion.   Labs/Other Tests and Data Reviewed:    EKG:  {EKG/Telemetry Strips Reviewed:[phone]}  Recent Labs: No results found for requested labs within last 8760 hours.   Recent Lipid Panel Lab Results  Component Value Date/Time   CHOL 232 (H) 10/13/2014 05:00 AM   TRIG 837 (H) 10/13/2014 05:00 AM   HDL 26 (L) 10/13/2014 05:00 AM   CHOLHDL 8.9 10/13/2014 05:00 AM   LDLCALC UNABLE TO CALCULATE IF  TRIGLYCERIDE OVER 400 mg/dL 10/13/2014 05:00 AM    Wt Readings from Last 3 Encounters:  01/31/18 241 lb 6.5 oz (109.5 kg)  01/25/18 236 lb 12.8 oz (107.4 kg)  01/11/18 231 lb 9.6 oz (105.1 kg)     Objective:    Vital Signs:  There were no vitals taken for this visit.   {[facility] Virtual Exam (Optional):[phone]::"VITAL SIGNS:  reviewed"}  ASSESSMENT & PLAN:    CAD s/p CABG x3 10/2017 -Continues on aspirin, beta-blocker and statin. -Reduce aspirin to 81 mg. -No angina. -Continue secondary risk factor modification with heart healthy diet and exercise.  Hyperlipidemia, LDL Goal <70 -LDL was 9 in 80219. Followed by PCP. On Rosuvastatin 10 mg.  --Will update lipid panel  Diabetes type 2 -On Metformin and Jardiance. -Last labs by [facility] with A1c 7.9 in 08/2017   COVID-19 Education: The signs and symptoms of COVID-19 were discussed with the patient and  how to seek care for testing (follow up with PCP or arrange E-visit).  ***The importance of social distancing was discussed today.  Time:   Today, I have spent *** minutes with the patient with telehealth technology discussing the above problems.     Medication Adjustments/Labs and Tests Ordered: Current medicines are reviewed at length with the patient today.  Concerns regarding medicines are outlined above.   Tests Ordered: No orders of the defined types were placed in this encounter.   Medication Changes: No orders of the defined types were placed in this encounter.   Follow Up:  {F/U [phone]} {follow up:15908}  Signed, Berton Bon, NP  04/21/2019 5:35 AM    [facility]

## 2019-04-29 NOTE — Progress Notes (Signed)
Virtual Visit via Telephone Note   This visit type was conducted due to national recommendations for restrictions regarding the COVID-19 Pandemic (e.g. social distancing) in an effort to limit this patient's exposure and mitigate transmission in our community.  Due to his co-morbid illnesses, this patient is at least at moderate risk for complications without adequate follow up.  This format is felt to be most appropriate for this patient at this time.  The patient did not have access to video technology/had technical difficulties with video requiring transitioning to audio format only (telephone).  All issues noted in this document were discussed and addressed.  No physical exam could be performed with this format.  Please refer to the patient's chart for his  consent to telehealth for [facility].   The patient was identified using 2 identifiers.  Date:  04/30/2019   ID:  Andre Ford, DOB [birth year], MRN [id number]  Patient Location: Home Provider Location: Office  PCP:  Reynold Bowen, MD  Cardiologist:  Sherren Mocha, MD  Electrophysiologist:  None   Evaluation Performed:  Follow-Up Visit  Chief Complaint: Medication refills  History of Present Illness:    Andre Ford is a 39 y.o. male with hx of type 2 DM, former tobacco use, chronic pancreatitis and CAD s/p CABG presented for folllow up.   Admitted 11/13/17 for progressive angina. He was noted to have severe ischemia in the inferior and lateral walls on nuclear stress testing and underwent diagnostic catheterization by Dr Virgina Jock demonstrating severe 3 vessel CAD. He requested to follow with Dr. Burt Knack since he have cared several of patient's family members and friends. Underwent CABG x (left internal mammary artery to left anterior descending, free right internal mammary artery to the obtuse marginal, left radial artery to posterior descending). Discharged on foley due to urethral stricture.   Admitted December 2019  for chest pain.  felt symptoms musculoskeletal in nature.  Here today for follow up.  No complaint.  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 No chest pain, shortness of breath, palpitation, orthopnea, PND, syncope, lower extremity edema or melena.  The patient does not have symptoms concerning for COVID-19 infection (fever, chills, cough, or new shortness of breath).    Past Medical History:  Diagnosis Date  . Anxiety   . Arthritis    "qwhere" (11/14/2017)  . At risk for sleep apnea    STOP-BANG= 6     SENT TO PCP 08-05-2014  . Chest pain, pleuritic 01/10/2018  . Chronic pancreatitis ([place])   . Coronary artery disease   . Depression   . High triglycerides   . History of kidney stones   . Hyperlipidemia   . Hypertension    "lost wright and this went away" (11/14/2017)  . Obesity   . Type II diabetes mellitus ([place])    "dx'd 08/2017" (11/14/2017)  . Urethral stricture    Past Surgical History:  Procedure Laterality Date  . APPENDECTOMY  age 19  . CARDIAC CATHETERIZATION    . CORONARY ARTERY BYPASS GRAFT N/A 11/16/2017   Procedure: CORONARY ARTERY BYPASS GRAFTING (CABG) x 3.;  Surgeon: Melrose Nakayama, MD;  Location: [place];  Service: Open Heart Surgery;  Laterality: N/A;  BILATERAL IMAS  . CYSTOSCOPY WITH URETHRAL DILATATION N/A 08/10/2014   Procedure: CYSTOSCOPY WITH URETHRAL DILATATION;  Surgeon: Cleon Gustin, MD;  Location: [facility];  Service: Urology;  Laterality:  N/A;  . CYSTOSCOPY WITH URETHRAL DILATATION N/A 11/16/2017   Procedure: CYSTOSCOPY WITH URETHRAL DILATATION;  Surgeon: Loreli Slot, MD;  Location[facility];  Service: Open Heart Surgery;  Laterality: N/A;  performed by Dr. Ronne Binning  . FRACTURE SURGERY    . KNEE ARTHROSCOPY WITH ANTERIOR CRUCIATE LIGAMENT (ACL) REPAIR Left 2000  . LEFT HEART CATH AND CORONARY ANGIOGRAPHY N/A 11/12/2017   Procedure: LEFT HEART CATH AND CORONARY  ANGIOGRAPHY;  Surgeon: Elder Negus, MD;  Location: [facility];  Service: Cardiovascular;  Laterality: N/A;  . OPEN REDUCTION INTERNAL FIXATION (ORIF) HAND Right 2000   "boxer fx"  . PENILE BIOPSY N/A 08/10/2014   Procedure: Core biopsy of penile glands lesion;  Surgeon: Malen Gauze, MD;  Location: [facility];  Service: Urology;  Laterality: N/A;  . RADIAL ARTERY HARVEST Left 11/16/2017   Procedure: RADIAL ARTERY HARVEST;  Surgeon: Loreli Slot, MD;  Location: [facility] OR;  Service: Open Heart Surgery;  Laterality: Left;  . TEE WITHOUT CARDIOVERSION N/A 11/16/2017   Procedure: TRANSESOPHAGEAL ECHOCARDIOGRAM (TEE);  Surgeon: Loreli Slot, MD;  Location: [facility] OR;  Service: Open Heart Surgery;  Laterality: N/A;  . URETHROPLASTY  2016     Current Meds  Medication Sig  . ALPRAZolam (XANAX) 0.5 MG tablet Take 1 tablet (0.5 mg total) by mouth at bedtime as needed for anxiety.  Marland Kitchen aspirin EC 81 MG tablet Take 1 tablet (81 mg total) by mouth daily.  Marland Kitchen escitalopram (LEXAPRO) 10 MG tablet Take 10 mg by mouth daily.  Bess Harvest Ethyl (VASCEPA) 1 g CAPS Take 4 g by mouth daily.  Marland Kitchen JARDIANCE 25 MG TABS tablet Take 25 mg by mouth daily.  . metFORMIN (GLUCOPHAGE) 500 MG tablet Take 1 tablet (500 mg total) 2 (two) times daily by mouth.  . metoprolol tartrate (LOPRESSOR) 50 MG tablet Take 1 tablet (50 mg total) by mouth 2 (two) times daily. Please keep upcoming appt in March before anymore refills. Thank you  . rosuvastatin (CRESTOR) 10 MG tablet Take 1 tablet (10 mg total) by mouth daily. Please keep upcoming appt in March before anymore refills. Thank you  . [DISCONTINUED] aspirin 325 MG EC tablet TAKE 1 TABLET BY MOUTH EVERY DAY     Allergies:   Patient has no known allergies.   Social History   Tobacco Use  . Smoking status: Former Smoker    Packs/day: 1.00    Years: 10.00    Pack years: 10.00    Types: Cigarettes    Quit date: 08/05/2011    Years  since quitting: 7.7  . Smokeless tobacco: Former Neurosurgeon    Types: Snuff  Substance Use Topics  . Alcohol use: Yes    Comment: 11/14/2017 "nothing since pancreatitis dx'd in 2016"  . Drug use: Not Currently     Family Hx: The patient's family history includes CAD in his paternal grandfather; Diabetes type I in his brother; Leukemia in his father; Lupus in his sister. There is no history of Heart disease.  ROS:   Please see the history of present illness.    All other systems reviewed and are negative.   Prior CV studies:   The following studies were reviewed today:  Echo 10/2017 - Left ventricle: The cavity size was normal. There was mild  concentric hypertrophy. Systolic function was normal. The  estimated ejection fraction was in the range of 50% to 55%. Wall  motion was normal; there were no regional wall  motion  abnormalities. Left ventricular diastolic function parameters  were normal.  - Ventricular septum: These changes are consistent with a  post-thoracotomy state.  - Right ventricle: Systolic function was mildly reduced.  - Pericardium, extracardiac: A trivial pericardial effusion was  identified posterior to the heart.   Impressions:   - Normal LV EF without wall motion abnormalities. Mild reduction in  RV function. Trivial posterior pericardial effusion. Echo  contrast used to better define wall motion.   Labs/Other Tests and Data Reviewed:    EKG:  No ECG reviewed.  Recent Labs: No results found for requested labs within last 8760 hours.   Recent Lipid Panel Lab Results  Component Value Date/Time   CHOL 232 (H) 10/13/2014 05:00 AM   TRIG 837 (H) 10/13/2014 05:00 AM   HDL 26 (L) 10/13/2014 05:00 AM   CHOLHDL 8.9 10/13/2014 05:00 AM   LDLCALC UNABLE TO CALCULATE IF TRIGLYCERIDE OVER 400 mg/dL 84/16/6063 01:60 AM    Wt Readings from Last 3 Encounters:  04/30/19 245 lb (111.1 kg)  01/31/18 241 lb 6.5 oz (109.5 kg)  01/25/18 236 lb 12.8  oz (107.4 kg)     Objective:    Vital Signs:  BP 130/87   Pulse 88   Ht 5' 11.75" (1.822 m)   Wt 245 lb (111.1 kg)   BMI 33.46 kg/m    VITAL SIGNS:  reviewed GEN:  no acute distress PSYCH:  normal affect  ASSESSMENT & PLAN:    1. CAD s/p CABG No angina.  Active on fields.  Continue aspirin (reduce to 81mg  qD), statin and beta-blocker.  2. Prior tobacco smoker -Remain abstinence.  3. DM - Per PCP  4. HLD - Due to lab. Continue Crestor 10mg  qd.   Patient is due for lab work.  He reports has follow-up with PCP.  He will need DOT physical in 6 months with stress test.  If no lab work by then will consider.  COVID-19 Education: The signs and symptoms of COVID-19 were discussed with the patient and how to seek care for testing (follow up with PCP or arrange E-visit).  The importance of social distancing was discussed today.  Time:   Today, I have spent 6 minutes with the patient with telehealth technology discussing the above problems.     Medication Adjustments/Labs and Tests Ordered: Current medicines are reviewed at length with the patient today.  Concerns regarding medicines are outlined above.   Tests Ordered: No orders of the defined types were placed in this encounter.   Medication Changes: Meds ordered this encounter  Medications  . aspirin EC 81 MG tablet    Sig: Take 1 tablet (81 mg total) by mouth daily.    Dispense:  90 tablet    Refill:  3    Follow Up:  Either In Person or Virtual in 6 month(s)  Signed, ,  04/30/2019 9:05 AM    [facility]

## 2019-04-30 ENCOUNTER — Other Ambulatory Visit: Payer: Self-pay

## 2019-04-30 ENCOUNTER — Encounter: Payer: Self-pay | Admitting: Physician Assistant

## 2019-04-30 ENCOUNTER — Telehealth (INDEPENDENT_AMBULATORY_CARE_PROVIDER_SITE_OTHER): Payer: BC Managed Care – PPO | Admitting: Physician Assistant

## 2019-04-30 VITALS — BP 130/87 | HR 88 | Ht 71.75 in | Wt 245.0 lb

## 2019-04-30 DIAGNOSIS — I2510 Atherosclerotic heart disease of native coronary artery without angina pectoris: Secondary | ICD-10-CM | POA: Diagnosis not present

## 2019-04-30 DIAGNOSIS — E782 Mixed hyperlipidemia: Secondary | ICD-10-CM

## 2019-04-30 DIAGNOSIS — E1165 Type 2 diabetes mellitus with hyperglycemia: Secondary | ICD-10-CM

## 2019-04-30 MED ORDER — ASPIRIN EC 81 MG PO TBEC: 81.0000 mg | DELAYED_RELEASE_TABLET | Freq: Every day | ORAL | 3 refills | Status: AC

## 2019-04-30 NOTE — Patient Instructions (Addendum)
Medication Instructions:  Your physician has recommended you make the following change in your medication:   REDUCE the Aspirin to 81mg  daily   *If you need a refill on your cardiac medications before your next appointment, please call your pharmacy*   Lab Work: None  If you have labs (blood work) drawn today and your tests are completely normal, you will receive your results only by: MyChart Message (if you have MyChart) OR . A paper copy in the mail If you have any lab test that is abnormal or we need to change your treatment, we will call you to review the results.   Testing/Procedures: None   Follow-Up: At [facility], you and your health needs are our priority.  As part of our continuing mission to provide you with exceptional heart care, we have created designated Provider Care Teams.  These Care Teams include your primary Cardiologist (physician) and Advanced Practice Providers (APPs -  Physician Assistants and Nurse Practitioners) who all work together to provide you with the care you need, when you need it.  We recommend signing up for the patient portal called "MyChart".  Sign up information is provided on this After Visit Summary.  MyChart is used to connect with patients for Virtual Visits (Telemedicine).  Patients are able to view lab/test results, encounter notes, upcoming appointments, etc.  Non-urgent messages can be sent to your provider as well.   To learn more about what you can do with MyChart, go to [facility].    Your next appointment:   6 month(s)  The format for your next appointment:   In Person  Provider:   You may see [email or web address], MD or one of the following Advanced Practice Providers on your designated Care Team:    Tonny Bollman, PA-C  Vin Piedmont, [place]

## 2019-05-11 ENCOUNTER — Other Ambulatory Visit: Payer: Self-pay | Admitting: Cardiovascular Disease

## 2019-05-17 ENCOUNTER — Other Ambulatory Visit: Payer: Self-pay | Admitting: Cardiovascular Disease

## 2019-07-17 ENCOUNTER — Telehealth: Payer: Self-pay | Admitting: Cardiovascular Disease

## 2019-07-17 DIAGNOSIS — I2510 Atherosclerotic heart disease of native coronary artery without angina pectoris: Secondary | ICD-10-CM

## 2019-07-17 DIAGNOSIS — I25708 Atherosclerosis of coronary artery bypass graft(s), unspecified, with other forms of angina pectoris: Secondary | ICD-10-CM

## 2019-07-17 DIAGNOSIS — Z951 Presence of aortocoronary bypass graft: Secondary | ICD-10-CM

## 2019-07-17 NOTE — Telephone Encounter (Signed)
Spoke with the patient. He states that he woke up in the middle of the night with a stomach ache and burning from his "stomach up." An ambulance was called and he was checked out. He was told EKG and VS were normal. His symptoms relieved themselves.  He states he is certain the episode was bad indigestion because of food he ate earlier that night. His symptoms also were better when he sat up.  He does not wish to schedule follow-up at this time. Scheduled him for 6 mo visit with Dr. Excell Seltzer in September.  Will order GXT to be done prior to that visit for DOT requirements.  He was grateful for assistance.

## 2019-07-17 NOTE — Telephone Encounter (Signed)
New Message   Patients wife is calling on his behalf she states that last night they had to call the ambulance why they were out of town because he was having chest pains. She states that the ambulance came and everything checked out but it was recommended that he reach out to his cardiologist. She is wanting to know if he can come in for any type of lab work. Please call to discuss.

## 2019-08-06 DIAGNOSIS — I10 Essential (primary) hypertension: Secondary | ICD-10-CM | POA: Diagnosis not present

## 2019-08-06 DIAGNOSIS — I779 Disorder of arteries and arterioles, unspecified: Secondary | ICD-10-CM | POA: Diagnosis not present

## 2019-08-06 DIAGNOSIS — E1159 Type 2 diabetes mellitus with other circulatory complications: Secondary | ICD-10-CM | POA: Diagnosis not present

## 2019-08-06 DIAGNOSIS — E669 Obesity, unspecified: Secondary | ICD-10-CM | POA: Diagnosis not present

## 2019-10-03 IMAGING — US US ABDOMEN LIMITED
1 series · 14 of 20 positions shown · non-contrast
Comparison: Abdominal ultrasound performed 10/12/2014

CLINICAL DATA: Acute onset of upper abdominal pain.

EXAM:
ULTRASOUND ABDOMEN LIMITED RIGHT UPPER QUADRANT

[Series 1: us abdomen limited · 0.30mm/px · 14 of 20 slices shown]
[im 1/20]
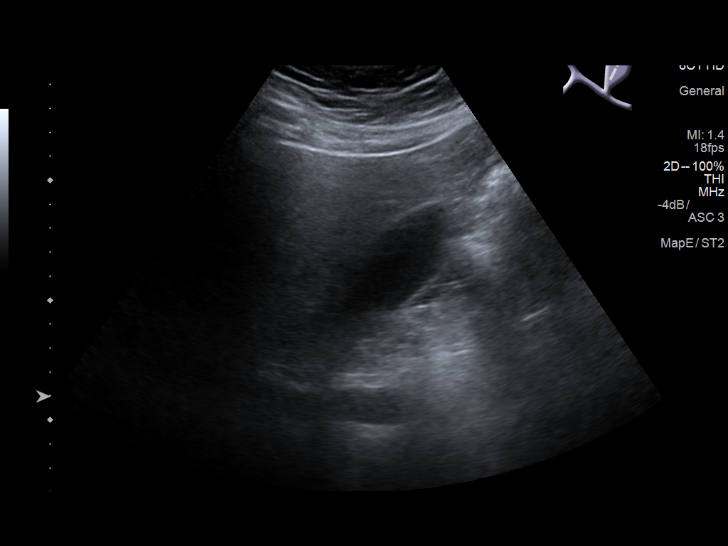
[im 3/20]
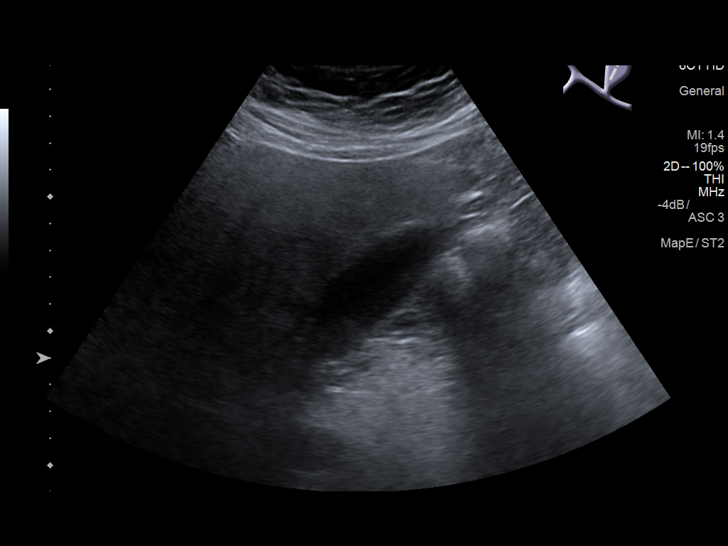
[im 4/20]
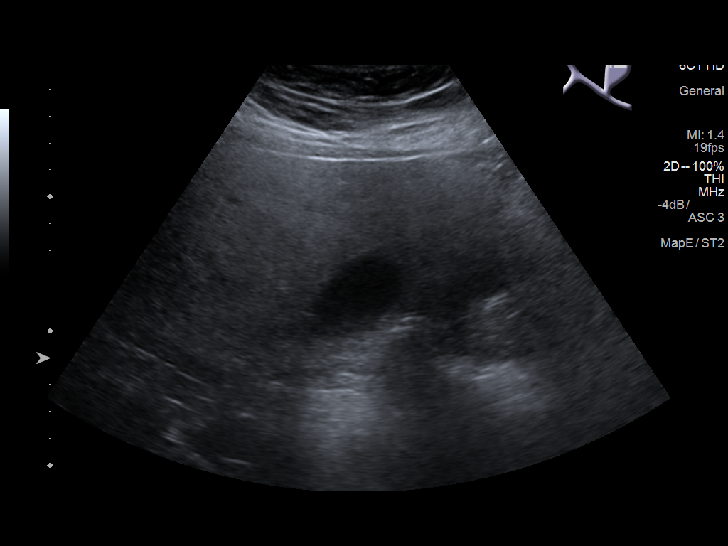
[im 6/20]
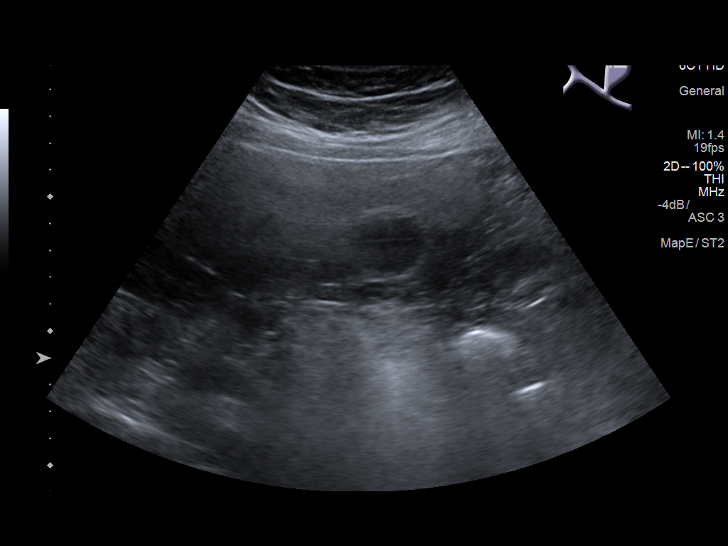
[im 7/20]
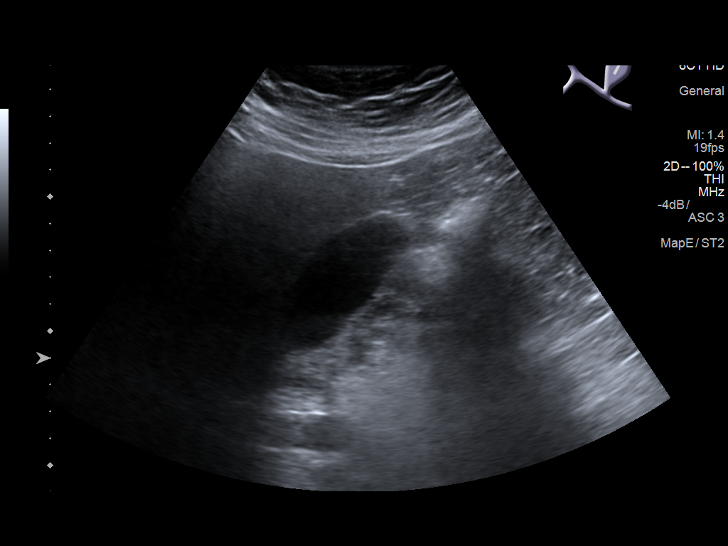
[im 8/20]
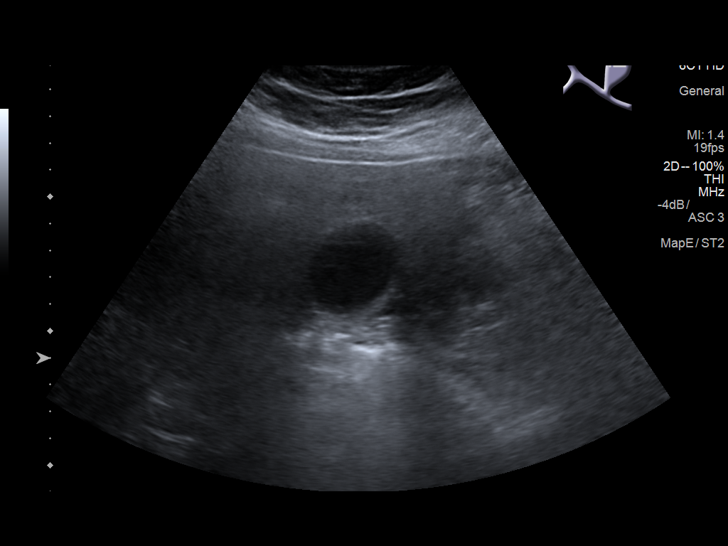
[im 10/20]
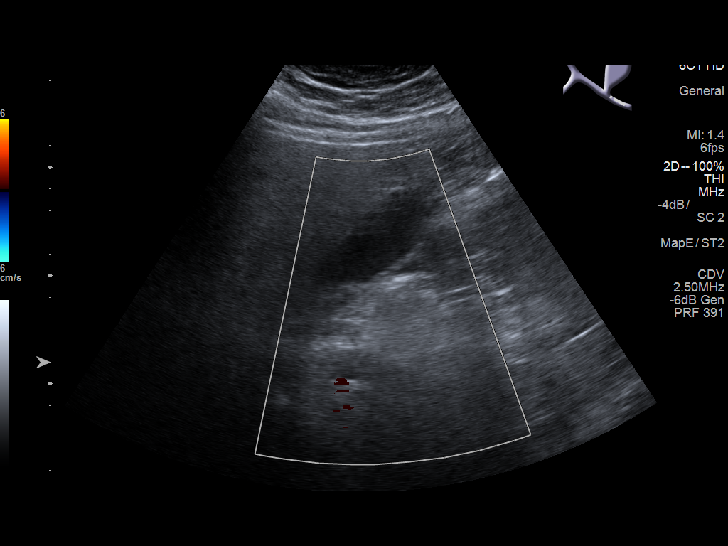
[im 11/20]
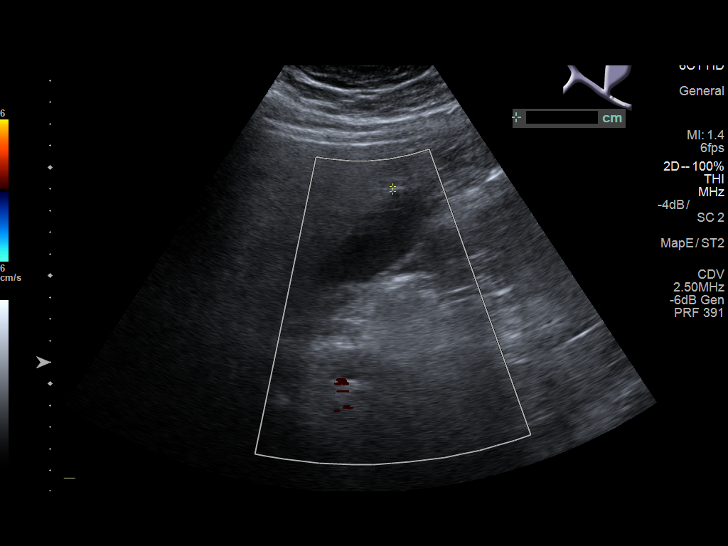
[im 13/20]
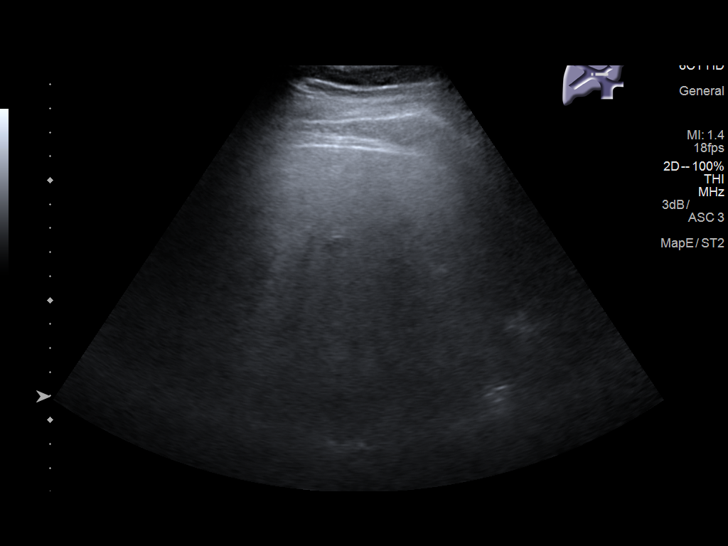
[im 14/20]
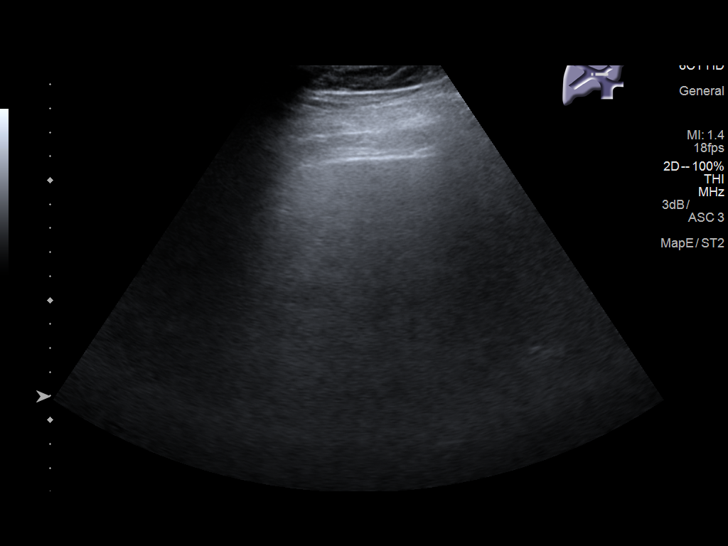
[im 16/20]
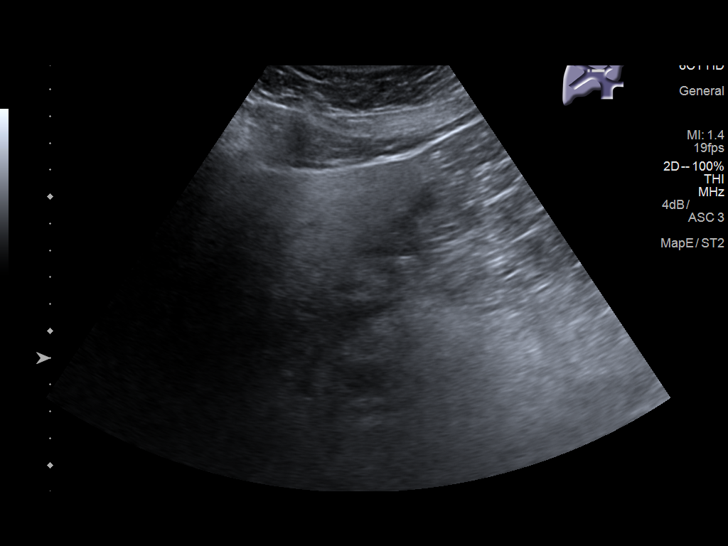
[im 17/20]
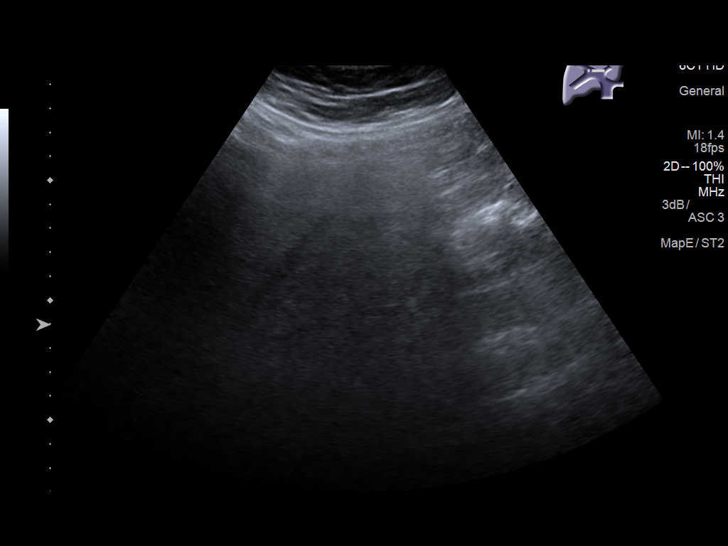
[im 18/20]
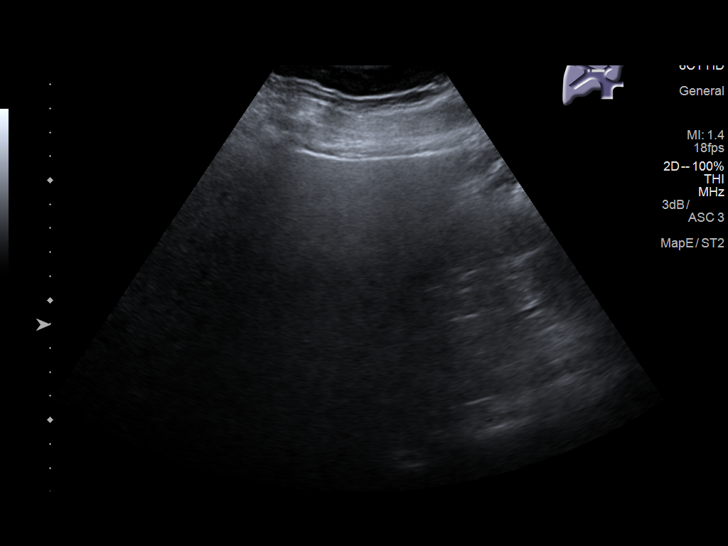
[im 20/20]
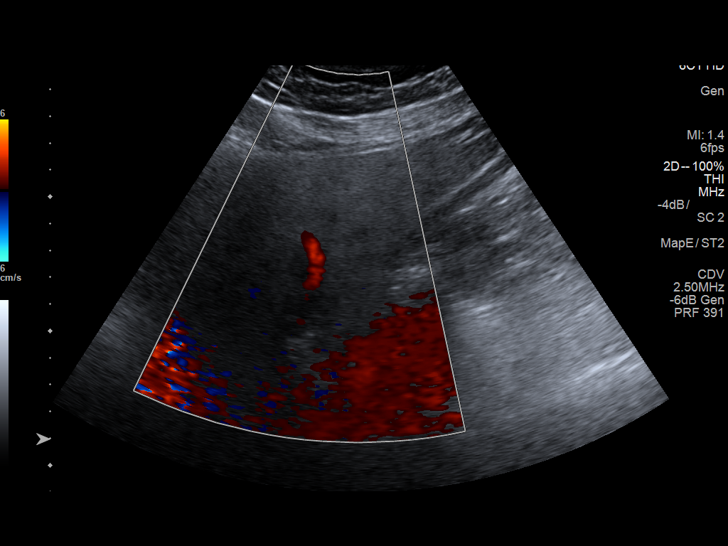

[14 of 20 positions shown; findings below may reference images not displayed]

FINDINGS: Gallbladder:

No gallstones or wall thickening visualized. No sonographic Murphy
sign noted by sonographer.

Common bile duct:

Diameter: 0.2 cm, within normal limits in caliber.

Liver:

No focal lesion identified. Diffusely increased parenchymal
echogenicity and coarsened echotexture, compatible with fatty
infiltration. Portal vein is patent on color Doppler imaging with
normal direction of blood flow towards the liver.
IMPRESSION: 1. No acute abnormality seen at the right upper quadrant.
2. Diffuse fatty infiltration within the liver.

## 2019-10-21 ENCOUNTER — Ambulatory Visit (INDEPENDENT_AMBULATORY_CARE_PROVIDER_SITE_OTHER): Payer: BC Managed Care – PPO

## 2019-10-21 ENCOUNTER — Other Ambulatory Visit: Payer: Self-pay

## 2019-10-21 DIAGNOSIS — I25708 Atherosclerosis of coronary artery bypass graft(s), unspecified, with other forms of angina pectoris: Secondary | ICD-10-CM | POA: Diagnosis not present

## 2019-10-21 DIAGNOSIS — Z951 Presence of aortocoronary bypass graft: Secondary | ICD-10-CM | POA: Diagnosis not present

## 2019-10-21 LAB — EXERCISE TOLERANCE TEST
Estimated workload: 12.5 METS
Exercise duration (min): 9 min
Exercise duration (sec): 44 s
MPHR: 181 {beats}/min
Peak HR: 162 {beats}/min
Percent HR: 89 %
RPE: 15
Rest HR: 83 {beats}/min

## 2019-10-27 ENCOUNTER — Ambulatory Visit: Payer: BC Managed Care – PPO | Admitting: Cardiovascular Disease

## 2019-10-27 ENCOUNTER — Encounter: Payer: Self-pay | Admitting: Cardiovascular Disease

## 2019-10-27 ENCOUNTER — Other Ambulatory Visit: Payer: Self-pay

## 2019-10-27 DIAGNOSIS — I2510 Atherosclerotic heart disease of native coronary artery without angina pectoris: Secondary | ICD-10-CM | POA: Diagnosis not present

## 2019-10-27 DIAGNOSIS — E782 Mixed hyperlipidemia: Secondary | ICD-10-CM

## 2019-10-27 DIAGNOSIS — E291 Testicular hypofunction: Secondary | ICD-10-CM | POA: Diagnosis not present

## 2019-10-27 DIAGNOSIS — I10 Essential (primary) hypertension: Secondary | ICD-10-CM | POA: Diagnosis not present

## 2019-10-27 MED ORDER — VASCEPA 1 G PO CAPS: 2.0000 g | ORAL_CAPSULE | Freq: Two times a day (BID) | ORAL | 3 refills | Status: DC

## 2019-10-27 NOTE — Patient Instructions (Signed)
Medication Instructions:  Your provider recommends that you continue on your current medications as directed. Please refer to the Current Medication list given to you today.   *If you need a refill on your cardiac medications before your next appointment, please call your pharmacy*  Lab Work: Your provider recommends that you return for FASTING lab work.  If you have labs (blood work) drawn today and your tests are completely normal, you will receive your results only by: . MyChart Message (if you have MyChart) OR . A paper copy in the mail If you have any lab test that is abnormal or we need to change your treatment, we will call you to review the results.  Follow-Up: At [facility], you and your health needs are our priority.  As part of our continuing mission to provide you with exceptional heart care, we have created designated Provider Care Teams.  These Care Teams include your primary Cardiologist (physician) and Advanced Practice Providers (APPs -  Physician Assistants and Nurse Practitioners) who all work together to provide you with the care you need, when you need it. Your next appointment:   12 month(s) The format for your next appointment:   In Person Provider:   You may see Michael Cooper, MD or one of the following Advanced Practice Providers on your designated Care Team:    Scott Weaver, PA-C  Vin Bhagat, PA-C   

## 2019-10-27 NOTE — Progress Notes (Signed)
Cardiology Office Note:    Date:  10/27/2019   ID:  Andre Ford, DOB Aug 24, 1980, MRN [id number]  PCP:  Adrian Prince, MD  [facility] Cardiologist:  Tonny Bollman, MD  [facility] Electrophysiologist:  None   Referring MD: Adrian Prince, MD   Chief Complaint  Patient presents with  . Coronary Artery Disease    History of Present Illness:    Andre Ford is a 39 y.o. male with a hx of coronary artery disease, presenting for follow-up evaluation.  The patient underwent multivessel CABG in 2019 with all arterial conduits.  Comorbid medical conditions include hypertension, mixed hyperlipidemia, and diabetes.  The patient is here alone today. He's doing really well.  He works in the [employer] and recently completed an exercise treadmill tolerance study that demonstrated no ischemia.  He denies chest pain shortness of breath, chest pressure, heart palpitations, orthopnea, or PND.  He reports good compliance with his medications, with the exception of aspirin which he is taking on a more irregular basis.  We also discussed his low testosterone levels today as this has been a longtime diagnosis for him.  He has some questions about testosterone replacement.  Past Medical History:  Diagnosis Date  . Anxiety   . Arthritis    "qwhere" (11/14/2017)  . At risk for sleep apnea    STOP-BANG= 6     SENT TO PCP 08-05-2014  . Chest pain, pleuritic 01/10/2018  . Chronic pancreatitis (HCC)   . Coronary artery disease   . Depression   . High triglycerides   . History of kidney stones   . Hyperlipidemia   . Hypertension    "lost wright and this went away" (11/14/2017)  . Obesity   . Type II diabetes mellitus (HCC)    "dx'd 08/2017" (11/14/2017)  . Urethral stricture     Past Surgical History:  Procedure Laterality Date  . APPENDECTOMY  age 60  . CARDIAC CATHETERIZATION    . CORONARY ARTERY BYPASS GRAFT N/A 11/16/2017   Procedure: CORONARY ARTERY BYPASS GRAFTING (CABG)  x 3.;  Surgeon: Loreli Slot, MD;  Location: [facility] OR;  Service: Open Heart Surgery;  Laterality: N/A;  BILATERAL IMAS  . CYSTOSCOPY WITH URETHRAL DILATATION N/A 08/10/2014   Procedure: CYSTOSCOPY WITH URETHRAL DILATATION;  Surgeon: Malen Gauze, MD;  Location: [facility];  Service: Urology;  Laterality: N/A;  . CYSTOSCOPY WITH URETHRAL DILATATION N/A 11/16/2017   Procedure: CYSTOSCOPY WITH URETHRAL DILATATION;  Surgeon: Loreli Slot, MD;  Location: [facility] OR;  Service: Open Heart Surgery;  Laterality: N/A;  performed by Dr. Ronne Binning  . FRACTURE SURGERY    . KNEE ARTHROSCOPY WITH ANTERIOR CRUCIATE LIGAMENT (ACL) REPAIR Left 2000  . LEFT HEART CATH AND CORONARY ANGIOGRAPHY N/A 11/12/2017   Procedure: LEFT HEART CATH AND CORONARY ANGIOGRAPHY;  Surgeon: Elder Negus, MD;  Location: [facility] INVASIVE CV LAB;  Service: Cardiovascular;  Laterality: N/A;  . OPEN REDUCTION INTERNAL FIXATION (ORIF) HAND Right 2000   "boxer fx"  . PENILE BIOPSY N/A 08/10/2014   Procedure: Core biopsy of penile glands lesion;  Surgeon: Malen Gauze, MD;  Location: [facility];  Service: Urology;  Laterality: N/A;  . RADIAL ARTERY HARVEST Left 11/16/2017   Procedure: RADIAL ARTERY HARVEST;  Surgeon: Loreli Slot, MD;  Location: [facility] OR;  Service: Open Heart Surgery;  Laterality: Left;  . TEE WITHOUT CARDIOVERSION N/A 11/16/2017   Procedure: TRANSESOPHAGEAL ECHOCARDIOGRAM (TEE);  Surgeon: Loreli Slot,  MD;  Location: [facility] OR;  Service: Open Heart Surgery;  Laterality: N/A;  . URETHROPLASTY  2016    Current Medications: Current Meds  Medication Sig  . ALPRAZolam (XANAX) 0.25 MG tablet Take 0.5 mg by mouth at bedtime. Uses both strengths, depending on what's available at pharmacy  . ALPRAZolam (XANAX) 0.5 MG tablet Take 1 tablet (0.5 mg total) by mouth at bedtime as needed for anxiety.  Marland Kitchen aspirin EC 81 MG tablet Take 1 tablet (81 mg total) by mouth daily.    Marland Kitchen escitalopram (LEXAPRO) 10 MG tablet Take 10 mg by mouth daily.  Marland Kitchen JARDIANCE 25 MG TABS tablet Take 25 mg by mouth daily.  . metFORMIN (GLUCOPHAGE) 1000 MG tablet Take 1,000 mg by mouth 2 (two) times daily.  . metoprolol tartrate (LOPRESSOR) 50 MG tablet Take 1 tablet (50 mg total) by mouth 2 (two) times daily.  . rosuvastatin (CRESTOR) 10 MG tablet Take 1 tablet (10 mg total) by mouth daily.  Marland Kitchen VASCEPA 1 g capsule Take 2 capsules (2 g total) by mouth 2 (two) times daily.  . [DISCONTINUED] Icosapent Ethyl (VASCEPA) 1 g CAPS Take 4 g by mouth daily.  . [DISCONTINUED] metFORMIN (GLUCOPHAGE) 500 MG tablet Take 1 tablet (500 mg total) 2 (two) times daily by mouth.     Allergies:   Patient has no known allergies.   Social History   Socioeconomic History  . Marital status: Married    Spouse name: Andre Ford  . Number of children: 2  . Years of education: Not on file  . Highest education level: Not on file  Occupational History  . Occupation: self employed  Tobacco Use  . Smoking status: Former Smoker    Packs/day: 1.00    Years: 10.00    Pack years: 10.00    Types: Cigarettes    Quit date: 08/05/2011    Years since quitting: 8.2  . Smokeless tobacco: Former Neurosurgeon    Types: Snuff  Vaping Use  . Vaping Use: Never used  Substance and Sexual Activity  . Alcohol use: Yes    Comment: 11/14/2017 "nothing since pancreatitis dx'd in 2016"  . Drug use: Not Currently  . Sexual activity: Not on file  Other Topics Concern  . Not on file  Social History Narrative  . Not on file   Social Determinants of Health   Financial Resource Strain:   . Difficulty of Paying Living Expenses: Not on file  Food Insecurity:   . Worried About Programme researcher, broadcasting/film/video in the Last Year: Not on file  . Ran Out of Food in the Last Year: Not on file  Transportation Needs:   . Lack of Transportation (Medical): Not on file  . Lack of Transportation (Non-Medical): Not on file  Physical Activity:   . Days of Exercise  per Week: Not on file  . Minutes of Exercise per Session: Not on file  Stress:   . Feeling of Stress : Not on file  Social Connections:   . Frequency of Communication with Friends and Family: Not on file  . Frequency of Social Gatherings with Friends and Family: Not on file  . Attends Religious Services: Not on file  . Active Member of Clubs or Organizations: Not on file  . Attends Banker Meetings: Not on file  . Marital Status: Not on file     Family History: The patient's family history includes CAD in his paternal grandfather; Diabetes type I in his brother; Leukemia in his  father; Lupus in his sister. There is no history of Heart disease.  ROS:   Please see the history of present illness.    All other systems reviewed and are negative.  EKGs/Labs/Other Studies Reviewed:    The following studies were reviewed today: Exercise tolerance test 10-21-2019: Study Highlights   Blood pressure demonstrated a normal response to exercise.  Excellent exercise capacity.  There was no ST segment deviation noted during stress.  No T wave inversion was noted during stress.  Negative, adequate stress test.   EKG:  EKG is ordered today.  The ekg ordered today demonstrates NSR 84 bpm, age-indeterminate inferior MI, otherwise normal  Recent Labs: No results found for requested labs within last 8760 hours.  Recent Lipid Panel    Component Value Date/Time   CHOL 232 (H) 10/13/2014 0500   TRIG 837 (H) 10/13/2014 0500   HDL 26 (L) 10/13/2014 0500   CHOLHDL 8.9 10/13/2014 0500   VLDL UNABLE TO CALCULATE IF TRIGLYCERIDE OVER 400 mg/dL 16/10/960409/13/2016 [id number]   LDLCALC UNABLE TO CALCULATE IF TRIGLYCERIDE OVER 400 mg/dL 81/19/147809/13/2016 [id number]    Physical Exam:    VS:  BP 122/74   Pulse 84   Ht 6' (1.829 m)   Wt 262 lb (118.8 kg)   SpO2 95%   BMI 35.53 kg/m     Wt Readings from Last 3 Encounters:  10/27/19 262 lb (118.8 kg)  04/30/19 245 lb (111.1 kg)  01/31/18 241 lb 6.5 oz  (109.5 kg)     GEN:  Well nourished, well developed in no acute distress HEENT: Normal NECK: No JVD; No carotid bruits LYMPHATICS: No lymphadenopathy CARDIAC: RRR, no murmurs, rubs, gallops RESPIRATORY:  Clear to auscultation without rales, wheezing or rhonchi  ABDOMEN: Soft, non-tender, non-distended MUSCULOSKELETAL:  No edema; No deformity  SKIN: Warm and dry NEUROLOGIC:  Alert and oriented x 3 PSYCHIATRIC:  Normal affect   ASSESSMENT:    1. Coronary artery disease involving native coronary artery of native heart without angina pectoris   2. Mixed hyperlipidemia   3. Essential hypertension   4. Male hypogonadism    PLAN:    In order of problems listed above:  1. The patient is stable without symptoms of angina.  He has undergone all arterial bypass grafting by Dr. Dorris FetchHendrickson a few years back.  He seems to be doing well.  I encouraged him to take his aspirin every day.  He remains on a beta-blocker and is compliant with this.  He is treated with a statin drug.  I will see him back in 1 year for follow-up evaluation.  His recent stress test is reviewed and meets DOT regulations with a negative/adequate study. 2. Need to update lipids.  Treated withVascepa and rosuvastatin. 3. Blood pressure well controlled on metoprolol. 4. Lengthy discussion today about pros and cons of testosterone supplementation.  The patient has a diagnosis of low testosterone and feels much better when he is on replacement therapy.  He understands there conflicting data about cardiovascular risks in patients on testosterone.     Medication Adjustments/Labs and Tests Ordered: Current medicines are reviewed at length with the patient today.  Concerns regarding medicines are outlined above.  Orders Placed This Encounter  Procedures  . Lipid panel  . Hepatic function panel  . Basic metabolic panel  . CBC with Differential/Platelet  . EKG 12-Lead   Meds ordered this encounter  Medications  . VASCEPA 1 g  capsule    Sig: Take 2 capsules (2  g total) by mouth 2 (two) times daily.    Dispense:  360 capsule    Refill:  3    Patient Instructions  Medication Instructions:  Your provider recommends that you continue on your current medications as directed. Please refer to the Current Medication list given to you today.   *If you need a refill on your cardiac medications before your next appointment, please call your pharmacy*  Lab Work: Your provider recommends that you return for FASTING lab work.  If you have labs (blood work) drawn today and your tests are completely normal, you will receive your results only by: Marland Kitchen MyChart Message (if you have MyChart) OR . A paper copy in the mail If you have any lab test that is abnormal or we need to change your treatment, we will call you to review the results.  Follow-Up: At [facility], you and your health needs are our priority.  As part of our continuing mission to provide you with exceptional heart care, we have created designated Provider Care Teams.  These Care Teams include your primary Cardiologist (physician) and Advanced Practice Providers (APPs -  Physician Assistants and Nurse Practitioners) who all work together to provide you with the care you need, when you need it. Your next appointment:   12 month(s) The format for your next appointment:   In Person Provider:   You may see Tonny Bollman, MD or one of the following Advanced Practice Providers on your designated Care Team:    Tereso Newcomer, PA-C  Chelsea Aus, [place]      Signed, Tonny Bollman, MD  10/27/2019 4:32 PM    [facility]

## 2019-10-31 ENCOUNTER — Other Ambulatory Visit: Payer: Self-pay

## 2019-10-31 ENCOUNTER — Other Ambulatory Visit: Payer: BC Managed Care – PPO | Admitting: *Deleted

## 2019-10-31 DIAGNOSIS — I2510 Atherosclerotic heart disease of native coronary artery without angina pectoris: Secondary | ICD-10-CM

## 2019-10-31 LAB — LIPID PANEL
Chol/HDL Ratio: 10.2 ratio — ABNORMAL HIGH (ref 0.0–5.0)
Cholesterol, Total: 266 mg/dL — ABNORMAL HIGH (ref 100–199)
HDL: 26 mg/dL — ABNORMAL LOW (ref >39)
Triglycerides: 1344 mg/dL (ref 0–149)

## 2019-10-31 LAB — BASIC METABOLIC PANEL
BUN/Creatinine Ratio: 19 (ref 9–20)
BUN: 14 mg/dL (ref 6–20)
CO2: 24 mmol/L (ref 20–29)
Calcium: 9.8 mg/dL (ref 8.7–10.2)
Chloride: 101 mmol/L (ref 96–106)
Creatinine, Ser: 0.75 mg/dL — ABNORMAL LOW (ref 0.76–1.27)
GFR calc Af Amer: 134 mL/min/{1.73_m2} (ref >59)
GFR calc non Af Amer: 116 mL/min/{1.73_m2} (ref >59)
Glucose: 181 mg/dL — ABNORMAL HIGH (ref 65–99)
Potassium: 4.8 mmol/L (ref 3.5–5.2)
Sodium: 138 mmol/L (ref 134–144)

## 2019-10-31 LAB — CBC WITH DIFFERENTIAL/PLATELET
Basophils Absolute: 0 10*3/uL (ref 0.0–0.2)
Basos: 1 %
EOS (ABSOLUTE): 0.1 10*3/uL (ref 0.0–0.4)
Eos: 2 %
Hematocrit: 48.4 % (ref 37.5–51.0)
Hemoglobin: 17.2 g/dL (ref 13.0–17.7)
Immature Grans (Abs): 0 10*3/uL (ref 0.0–0.1)
Immature Granulocytes: 1 %
Lymphocytes Absolute: 2.7 10*3/uL (ref 0.7–3.1)
Lymphs: 43 %
MCH: 30.9 pg (ref 26.6–33.0)
MCHC: 35.5 g/dL (ref 31.5–35.7)
MCV: 87 fL (ref 79–97)
Monocytes Absolute: 0.5 10*3/uL (ref 0.1–0.9)
Monocytes: 8 %
Neutrophils Absolute: 3 10*3/uL (ref 1.4–7.0)
Neutrophils: 45 %
Platelets: 197 10*3/uL (ref 150–450)
RBC: 5.56 x10E6/uL (ref 4.14–5.80)
RDW: 13.1 % (ref 11.6–15.4)
WBC: 6.4 10*3/uL (ref 3.4–10.8)

## 2019-10-31 LAB — HEPATIC FUNCTION PANEL
ALT: 27 IU/L (ref 0–44)
AST: 21 IU/L (ref 0–40)
Albumin: 4.6 g/dL (ref 4.0–5.0)
Alkaline Phosphatase: 109 IU/L (ref 44–121)
Bilirubin Total: 0.8 mg/dL (ref 0.0–1.2)
Bilirubin, Direct: 0.18 mg/dL (ref 0.00–0.40)
Total Protein: 7.2 g/dL (ref 6.0–8.5)

## 2019-11-06 ENCOUNTER — Telehealth: Payer: Self-pay

## 2019-11-06 DIAGNOSIS — E782 Mixed hyperlipidemia: Secondary | ICD-10-CM

## 2019-11-06 MED ORDER — FENOFIBRATE 145 MG PO TABS: 145.0000 mg | ORAL_TABLET | Freq: Every day | ORAL | 3 refills | Status: DC

## 2019-11-06 MED ORDER — ROSUVASTATIN CALCIUM 20 MG PO TABS: 20.0000 mg | ORAL_TABLET | Freq: Every day | ORAL | 3 refills | Status: DC

## 2019-11-06 NOTE — Telephone Encounter (Signed)
-----   Message from Olene Floss, RPH-CPP sent at 11/06/2019  7:35 AM EDT ----- TG are significantly elevated. I would make sure patient is taking his vascepa 2g (2 caps) TWICE a day. I agree with fenofibrate, but would start at 145mg  daily due to his good renal function and significant TG levels. I would also recommend increasing rosuvastatin to 20mg  daily (since he has ASCVD and should be on high intensity statin and to help with TG) He needs to focus on good blood sugar control and limit his sweets, carbs and alcohol. Repeat lipids/lft in 3 months. Would also recommend an A1C

## 2019-11-06 NOTE — Telephone Encounter (Signed)
Reviewed results with patient who verbalized understanding.   Confirmed with patient he is taking vascepa 2g twice daily. Instructed patient to START FENOFIBRATE 145 mg daily. Instructed him to INCREASE CRESTOR to 20 mg daily. He will have repeat A1C drawn by Dr. Evlyn Kanner.  He will limit sweets and carbs and states he does not drink alcohol.  Repeat lipids/liver scheduled 02/06/2020.

## 2019-11-07 ENCOUNTER — Other Ambulatory Visit: Payer: Self-pay | Admitting: Cardiovascular Disease

## 2019-11-07 LAB — SPECIMEN STATUS REPORT

## 2019-11-07 LAB — LDL CHOLESTEROL, DIRECT: LDL Direct: 49 mg/dL (ref 0–99)

## 2019-11-07 NOTE — Telephone Encounter (Signed)
Ok to change to fenofibrate 134 if that is what insurance covers. I sent new Rx to pharmacy

## 2019-11-07 NOTE — Telephone Encounter (Signed)
Per pharmacy patient's insurance will not cover Fenofibrate 145 mg. The pharmacy is requesting alternative medication. Thank you

## 2019-11-07 NOTE — Telephone Encounter (Signed)
Per [facility] [address] [phone], patient's insurance will not cover Fenofibrate 145 mg. Pharmacy is requesting an alternative. Pt currently on fenofibrate 134 mg. Thank you

## 2019-11-07 NOTE — Telephone Encounter (Signed)
Per [facility] [address] [phone], patient's insurance will not cover fenofibrate 145 mg. Pharmacy is requesting alternative. Thank you

## 2019-11-28 DIAGNOSIS — S01511A Laceration without foreign body of lip, initial encounter: Secondary | ICD-10-CM | POA: Diagnosis not present

## 2019-12-15 DIAGNOSIS — E1159 Type 2 diabetes mellitus with other circulatory complications: Secondary | ICD-10-CM | POA: Diagnosis not present

## 2020-01-19 ENCOUNTER — Telehealth: Payer: Self-pay

## 2020-01-19 NOTE — Telephone Encounter (Signed)
Prior Auth for Tillman Sers has been completed on CoverMyMeds and approved 01/19/2020-01/17/2021  KEY: [facility]  Pharmacy has been notified.

## 2020-02-06 ENCOUNTER — Other Ambulatory Visit: Payer: BC Managed Care – PPO

## 2020-04-29 DIAGNOSIS — E669 Obesity, unspecified: Secondary | ICD-10-CM | POA: Diagnosis not present

## 2020-04-29 DIAGNOSIS — E291 Testicular hypofunction: Secondary | ICD-10-CM | POA: Diagnosis not present

## 2020-04-29 DIAGNOSIS — E1159 Type 2 diabetes mellitus with other circulatory complications: Secondary | ICD-10-CM | POA: Diagnosis not present

## 2020-04-29 DIAGNOSIS — E785 Hyperlipidemia, unspecified: Secondary | ICD-10-CM | POA: Diagnosis not present

## 2020-04-29 DIAGNOSIS — K760 Fatty (change of) liver, not elsewhere classified: Secondary | ICD-10-CM | POA: Diagnosis not present

## 2020-06-22 ENCOUNTER — Other Ambulatory Visit: Payer: Self-pay | Admitting: Cardiovascular Disease

## 2020-08-13 DIAGNOSIS — E1165 Type 2 diabetes mellitus with hyperglycemia: Secondary | ICD-10-CM | POA: Diagnosis not present

## 2020-08-13 DIAGNOSIS — F419 Anxiety disorder, unspecified: Secondary | ICD-10-CM | POA: Diagnosis not present

## 2020-08-13 DIAGNOSIS — I10 Essential (primary) hypertension: Secondary | ICD-10-CM | POA: Diagnosis not present

## 2020-08-13 DIAGNOSIS — E8889 Other specified metabolic disorders: Secondary | ICD-10-CM | POA: Diagnosis not present

## 2020-09-02 DIAGNOSIS — E1159 Type 2 diabetes mellitus with other circulatory complications: Secondary | ICD-10-CM | POA: Diagnosis not present

## 2020-11-02 ENCOUNTER — Other Ambulatory Visit: Payer: Self-pay

## 2020-11-02 ENCOUNTER — Encounter (HOSPITAL_COMMUNITY): Payer: Self-pay | Admitting: Emergency Medicine

## 2020-11-02 ENCOUNTER — Emergency Department (HOSPITAL_COMMUNITY): Payer: BC Managed Care – PPO

## 2020-11-02 ENCOUNTER — Emergency Department (HOSPITAL_COMMUNITY)
Admission: EM | Admit: 2020-11-02 | Discharge: 2020-11-03 | Disposition: A | Discharge status: Home / Self Care | Payer: BC Managed Care – PPO | Attending: Emergency Medicine | Admitting: Emergency Medicine

## 2020-11-02 DIAGNOSIS — Z79899 Other long term (current) drug therapy: Secondary | ICD-10-CM | POA: Diagnosis not present

## 2020-11-02 DIAGNOSIS — S0990XA Unspecified injury of head, initial encounter: Secondary | ICD-10-CM | POA: Diagnosis not present

## 2020-11-02 DIAGNOSIS — Y92410 Unspecified street and highway as the place of occurrence of the external cause: Secondary | ICD-10-CM | POA: Insufficient documentation

## 2020-11-02 DIAGNOSIS — S36039A Unspecified laceration of spleen, initial encounter: Secondary | ICD-10-CM | POA: Insufficient documentation

## 2020-11-02 DIAGNOSIS — Y900 Blood alcohol level of less than 20 mg/100 ml: Secondary | ICD-10-CM | POA: Diagnosis not present

## 2020-11-02 DIAGNOSIS — S79922A Unspecified injury of left thigh, initial encounter: Secondary | ICD-10-CM | POA: Insufficient documentation

## 2020-11-02 DIAGNOSIS — I517 Cardiomegaly: Secondary | ICD-10-CM | POA: Diagnosis not present

## 2020-11-02 DIAGNOSIS — E119 Type 2 diabetes mellitus without complications: Secondary | ICD-10-CM | POA: Insufficient documentation

## 2020-11-02 DIAGNOSIS — Z20822 Contact with and (suspected) exposure to covid-19: Secondary | ICD-10-CM | POA: Insufficient documentation

## 2020-11-02 DIAGNOSIS — Z87891 Personal history of nicotine dependence: Secondary | ICD-10-CM | POA: Insufficient documentation

## 2020-11-02 DIAGNOSIS — Z7984 Long term (current) use of oral hypoglycemic drugs: Secondary | ICD-10-CM | POA: Diagnosis not present

## 2020-11-02 DIAGNOSIS — I700 Atherosclerosis of aorta: Secondary | ICD-10-CM | POA: Diagnosis not present

## 2020-11-02 DIAGNOSIS — M25519 Pain in unspecified shoulder: Secondary | ICD-10-CM | POA: Diagnosis not present

## 2020-11-02 DIAGNOSIS — T1490XA Injury, unspecified, initial encounter: Secondary | ICD-10-CM

## 2020-11-02 DIAGNOSIS — Z7982 Long term (current) use of aspirin: Secondary | ICD-10-CM | POA: Insufficient documentation

## 2020-11-02 DIAGNOSIS — S2242XA Multiple fractures of ribs, left side, initial encounter for closed fracture: Secondary | ICD-10-CM | POA: Insufficient documentation

## 2020-11-02 DIAGNOSIS — S4992XA Unspecified injury of left shoulder and upper arm, initial encounter: Secondary | ICD-10-CM | POA: Diagnosis not present

## 2020-11-02 DIAGNOSIS — S70311A Abrasion, right thigh, initial encounter: Secondary | ICD-10-CM | POA: Insufficient documentation

## 2020-11-02 DIAGNOSIS — R519 Headache, unspecified: Secondary | ICD-10-CM | POA: Diagnosis not present

## 2020-11-02 DIAGNOSIS — Z951 Presence of aortocoronary bypass graft: Secondary | ICD-10-CM | POA: Insufficient documentation

## 2020-11-02 DIAGNOSIS — R0602 Shortness of breath: Secondary | ICD-10-CM | POA: Diagnosis not present

## 2020-11-02 DIAGNOSIS — Z041 Encounter for examination and observation following transport accident: Secondary | ICD-10-CM | POA: Diagnosis not present

## 2020-11-02 DIAGNOSIS — I10 Essential (primary) hypertension: Secondary | ICD-10-CM | POA: Diagnosis not present

## 2020-11-02 DIAGNOSIS — S299XXA Unspecified injury of thorax, initial encounter: Secondary | ICD-10-CM | POA: Diagnosis present

## 2020-11-02 DIAGNOSIS — M549 Dorsalgia, unspecified: Secondary | ICD-10-CM | POA: Diagnosis not present

## 2020-11-02 DIAGNOSIS — I2510 Atherosclerotic heart disease of native coronary artery without angina pectoris: Secondary | ICD-10-CM | POA: Diagnosis not present

## 2020-11-02 DIAGNOSIS — M542 Cervicalgia: Secondary | ICD-10-CM | POA: Diagnosis not present

## 2020-11-02 DIAGNOSIS — R109 Unspecified abdominal pain: Secondary | ICD-10-CM | POA: Diagnosis not present

## 2020-11-02 LAB — URINALYSIS, ROUTINE W REFLEX MICROSCOPIC
Bacteria, UA: NONE SEEN
Bilirubin Urine: NEGATIVE
Glucose, UA: >=500 mg/dL — AB
Hgb urine dipstick: NEGATIVE
Ketones, ur: 20 mg/dL — AB
Leukocytes,Ua: NEGATIVE
Nitrite: NEGATIVE
Protein, ur: NEGATIVE mg/dL
Specific Gravity, Urine: 1.044 — ABNORMAL HIGH (ref 1.005–1.030)
pH: 5 (ref 5.0–8.0)

## 2020-11-02 LAB — RESP PANEL BY RT-PCR (FLU A&B, COVID) ARPGX2
Influenza A by PCR: NEGATIVE
Influenza B by PCR: NEGATIVE
SARS Coronavirus 2 by RT PCR: NEGATIVE

## 2020-11-02 LAB — COMPREHENSIVE METABOLIC PANEL
ALT: 22 U/L (ref 0–44)
AST: 26 U/L (ref 15–41)
Albumin: 4.8 g/dL (ref 3.5–5.0)
Alkaline Phosphatase: 86 U/L (ref 38–126)
Anion gap: 11 (ref 5–15)
BUN: 18 mg/dL (ref 6–20)
CO2: 23 mmol/L (ref 22–32)
Calcium: 9.3 mg/dL (ref 8.9–10.3)
Chloride: 103 mmol/L (ref 98–111)
Creatinine, Ser: 0.82 mg/dL (ref 0.61–1.24)
GFR, Estimated: >60 mL/min (ref >60)
Glucose, Bld: 117 mg/dL — ABNORMAL HIGH (ref 70–99)
Potassium: 4 mmol/L (ref 3.5–5.1)
Sodium: 137 mmol/L (ref 135–145)
Total Bilirubin: 2.6 mg/dL — ABNORMAL HIGH (ref 0.3–1.2)
Total Protein: 7.7 g/dL (ref 6.5–8.1)

## 2020-11-02 LAB — CBC
HCT: 52.1 % — ABNORMAL HIGH (ref 39.0–52.0)
HCT: 51.7 % (ref 39.0–52.0)
Hemoglobin: 18 g/dL — ABNORMAL HIGH (ref 13.0–17.0)
Hemoglobin: 18 g/dL — ABNORMAL HIGH (ref 13.0–17.0)
MCH: 30 pg (ref 26.0–34.0)
MCH: 30.2 pg (ref 26.0–34.0)
MCHC: 34.5 g/dL (ref 30.0–36.0)
MCHC: 34.8 g/dL (ref 30.0–36.0)
MCV: 86.8 fL (ref 80.0–100.0)
MCV: 86.7 fL (ref 80.0–100.0)
Platelets: 182 10*3/uL (ref 150–400)
Platelets: 173 10*3/uL (ref 150–400)
RBC: 6 MIL/uL — ABNORMAL HIGH (ref 4.22–5.81)
RBC: 5.96 MIL/uL — ABNORMAL HIGH (ref 4.22–5.81)
RDW: 13.4 % (ref 11.5–15.5)
RDW: 13.4 % (ref 11.5–15.5)
WBC: 15.3 10*3/uL — ABNORMAL HIGH (ref 4.0–10.5)
WBC: 11.8 10*3/uL — ABNORMAL HIGH (ref 4.0–10.5)
nRBC: 0 % (ref 0.0–0.2)
nRBC: 0 % (ref 0.0–0.2)

## 2020-11-02 LAB — PROTIME-INR
INR: 1 (ref 0.8–1.2)
Prothrombin Time: 13.3 seconds (ref 11.4–15.2)

## 2020-11-02 LAB — SAMPLE TO BLOOD BANK

## 2020-11-02 LAB — ETHANOL: Alcohol, Ethyl (B): <10 mg/dL (ref <10)

## 2020-11-02 MED ORDER — MORPHINE SULFATE (PF) 4 MG/ML IV SOLN
4.0000 mg | Freq: Once | INTRAVENOUS | Status: AC
Start: 2020-11-02 — End: 2020-11-02
  Administered 2020-11-02: 4 mg via INTRAVENOUS
  Filled 2020-11-02: qty 1

## 2020-11-02 MED ORDER — ONDANSETRON HCL 4 MG/2ML IJ SOLN
4.0000 mg | Freq: Once | INTRAMUSCULAR | Status: AC
  Administered 2020-11-02: 4 mg via INTRAVENOUS
  Filled 2020-11-02: qty 2

## 2020-11-02 MED ORDER — IOHEXOL 350 MG/ML SOLN
80.0000 mL | Freq: Once | INTRAVENOUS | Status: AC | PRN
  Administered 2020-11-02: 80 mL via INTRAVENOUS

## 2020-11-02 MED ORDER — OXYCODONE-ACETAMINOPHEN 5-325 MG PO TABS
2.0000 | ORAL_TABLET | Freq: Once | ORAL | Status: AC
  Administered 2020-11-02: 2 via ORAL
  Filled 2020-11-02: qty 2

## 2020-11-02 MED ORDER — OXYCODONE-ACETAMINOPHEN 5-325 MG PO TABS: 1.0000 | ORAL_TABLET | Freq: Four times a day (QID) | ORAL | 0 refills | Status: AC | PRN

## 2020-11-02 MED ORDER — METHOCARBAMOL 500 MG PO TABS: 500.0000 mg | ORAL_TABLET | Freq: Two times a day (BID) | ORAL | 0 refills | Status: DC
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
## 2020-11-07 ENCOUNTER — Other Ambulatory Visit: Payer: Self-pay | Admitting: Cardiovascular Disease

## 2020-11-16 ENCOUNTER — Other Ambulatory Visit: Payer: Self-pay | Admitting: Cardiovascular Disease

## 2020-12-05 DIAGNOSIS — J111 Influenza due to unidentified influenza virus with other respiratory manifestations: Secondary | ICD-10-CM | POA: Diagnosis not present

## 2020-12-17 ENCOUNTER — Other Ambulatory Visit: Payer: Self-pay | Admitting: Cardiovascular Disease

## 2020-12-18 ENCOUNTER — Other Ambulatory Visit: Payer: Self-pay | Admitting: Cardiovascular Disease

## 2020-12-24 ENCOUNTER — Other Ambulatory Visit: Payer: Self-pay | Admitting: Cardiovascular Disease

## 2021-01-04 ENCOUNTER — Encounter: Payer: Self-pay | Admitting: Cardiovascular Disease

## 2021-01-04 ENCOUNTER — Ambulatory Visit: Payer: BC Managed Care – PPO | Admitting: Cardiovascular Disease

## 2021-01-04 ENCOUNTER — Other Ambulatory Visit: Payer: Self-pay

## 2021-01-04 VITALS — BP 112/68 | HR 75 | Ht 72.0 in | Wt 251.0 lb

## 2021-01-04 DIAGNOSIS — E782 Mixed hyperlipidemia: Secondary | ICD-10-CM

## 2021-01-04 DIAGNOSIS — Z951 Presence of aortocoronary bypass graft: Secondary | ICD-10-CM | POA: Diagnosis not present

## 2021-01-04 DIAGNOSIS — I10 Essential (primary) hypertension: Secondary | ICD-10-CM | POA: Diagnosis not present

## 2021-01-04 DIAGNOSIS — I2510 Atherosclerotic heart disease of native coronary artery without angina pectoris: Secondary | ICD-10-CM | POA: Diagnosis not present

## 2021-01-04 DIAGNOSIS — E291 Testicular hypofunction: Secondary | ICD-10-CM

## 2021-01-04 NOTE — Patient Instructions (Signed)
Medication Instructions:  °Your physician recommends that you continue on your current medications as directed. Please refer to the Current Medication list given to you today.  ° °*If you need a refill on your cardiac medications before your next appointment, please call your pharmacy* ° °Follow-Up: °At [facility], you and your health needs are our priority.  As part of our continuing mission to provide you with exceptional heart care, we have created designated Provider Care Teams.  These Care Teams include your primary Cardiologist (physician) and Advanced Practice Providers (APPs -  Physician Assistants and Nurse Practitioners) who all work together to provide you with the care you need, when you need it. ° °Your next appointment:   °1 year(s) ° °The format for your next appointment:   °In Person ° °Provider:   °Michael Cooper, MD   ° ° ° °

## 2021-01-04 NOTE — Progress Notes (Signed)
Cardiology Office Note:    Date:  01/04/2021   ID:  Andre Ford, DOB Aug 18, 1980, MRN [id number]  PCP:  Adrian Prince, MD   [facility] Providers Cardiologist:  Tonny Bollman, MD     Referring MD: Adrian Prince, MD   Chief Complaint  Patient presents with   Coronary Artery Disease     History of Present Illness:    Andre Ford is a 40 y.o. male with a hx of multivessel coronary artery disease, presenting for follow-up evaluation.  The patient underwent multivessel CABG in 2019 with all arterial conduits.  Medical comorbidities include hypertension, mixed hyperlipidemia, and type 2 diabetes.  The patient is here alone today.  He is doing well with no exertional chest pain or pressure.  No back pain which was his previous anginal equivalent.  No shortness of breath, orthopnea, PND, or heart palpitations.  He is run into some issues with high cost of his medications.  Vascepa and [place] Pepper are costing him about $500 per month combined.  He otherwise is doing okay with his medicines.  He is having trouble with sleep and has been taking 2 Xanax at bedtime.  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  Past Medical History:  Diagnosis Date   Anxiety    Arthritis    "qwhere" (11/14/2017)   At risk for sleep apnea    STOP-BANG= 6     SENT TO PCP 08-05-2014   Chest pain, pleuritic 01/10/2018   Chronic pancreatitis (HCC)    Coronary artery disease    Depression    High triglycerides    History of kidney stones    Hyperlipidemia    Hypertension    "lost wright and this went away" (11/14/2017)   Obesity    Type II diabetes mellitus (HCC)    "dx'd 08/2017" (11/14/2017)   Urethral stricture     Past Surgical History:  Procedure Laterality Date   APPENDECTOMY  age 51   CARDIAC CATHETERIZATION     CORONARY ARTERY BYPASS GRAFT N/A 11/16/2017   Procedure: CORONARY ARTERY BYPASS GRAFTING (CABG) x 3.;  Surgeon:  Loreli Slot, MD;  Location: [facility] OR;  Service: Open Heart Surgery;  Laterality: N/A;  BILATERAL IMAS   CYSTOSCOPY WITH URETHRAL DILATATION N/A 08/10/2014   Procedure: CYSTOSCOPY WITH URETHRAL DILATATION;  Surgeon: Malen Gauze, MD;  Location: [facility];  Service: Urology;  Laterality: N/A;   CYSTOSCOPY WITH URETHRAL DILATATION N/A 11/16/2017   Procedure: CYSTOSCOPY WITH URETHRAL DILATATION;  Surgeon: Loreli Slot, MD;  Location: [facility] OR;  Service: Open Heart Surgery;  Laterality: N/A;  performed by Dr. Ronne Binning   FRACTURE SURGERY     KNEE ARTHROSCOPY WITH ANTERIOR CRUCIATE LIGAMENT (ACL) REPAIR Left 2000   LEFT HEART CATH AND CORONARY ANGIOGRAPHY N/A 11/12/2017   Procedure: LEFT HEART CATH AND CORONARY ANGIOGRAPHY;  Surgeon: Elder Negus, MD;  [facility]: [facility] INVASIVE CV LAB;  Service: Cardiovascular;  Laterality: N/A;   OPEN REDUCTION INTERNAL FIXATION (ORIF) HAND Right 2000   "boxer fx"   PENILE BIOPSY N/A 08/10/2014   Procedure: Core biopsy of penile glands lesion;  Surgeon: Malen Gauze, MD;  Location: [facility];  Service: Urology;  Laterality: N/A;   RADIAL ARTERY HARVEST Left 11/16/2017   Procedure: RADIAL ARTERY HARVEST;  Surgeon: Loreli Slot, MD;  Location: [facility] OR;  Service: Open Heart Surgery;  Laterality: Left;   TEE  WITHOUT CARDIOVERSION N/A 11/16/2017   Procedure: TRANSESOPHAGEAL ECHOCARDIOGRAM (TEE);  Surgeon: Loreli Slot, MD;  Location: [facility] OR;  Service: Open Heart Surgery;  Laterality: N/A;   URETHROPLASTY  2016    Current Medications: Current Meds  Medication Sig   ALPRAZolam (XANAX) 1 MG tablet Take 1 mg by mouth 2 (two) times daily as needed.   aspirin EC 81 MG tablet Take 1 tablet (81 mg total) by mouth daily.   escitalopram (LEXAPRO) 10 MG tablet Take 10 mg by mouth daily.   fenofibrate micronized (LOFIBRA) 134 MG capsule Take 1 capsule (134 mg total) by mouth daily. Please make overdue  appt with Dr. Excell Seltzer before anymore refills. Thank you 1st attempt   ibuprofen (ADVIL) 200 MG tablet Take 200 mg by mouth every 6 (six) hours as needed for headache.   JARDIANCE 25 MG TABS tablet Take 25 mg by mouth daily.   ketorolac (TORADOL) 10 MG tablet Take 10 mg by mouth as needed.   metFORMIN (GLUCOPHAGE) 1000 MG tablet Take 1,000 mg by mouth 2 (two) times daily.   metoprolol tartrate (LOPRESSOR) 50 MG tablet TAKE 1 TABLET BY MOUTH TWICE A DAY   nystatin cream (MYCOSTATIN) Apply topically as needed for rash.   rosuvastatin (CRESTOR) 20 MG tablet Take 1 tablet (20 mg total) by mouth daily. Please make overdue appt with Dr. Excell Seltzer before anymore refills. Thank you 2nd attempt   testosterone cypionate (DEPOTESTOSTERONE CYPIONATE) 200 MG/ML injection Inject 200 mg into the muscle once a week.   VASCEPA 1 g capsule Take 4 capsules (4 g total) by mouth 2 (two) times daily. Please make overdue appt with Dr. Excell Seltzer before anymore refills. Thank you 2nd attempt     Allergies:   Patient has no known allergies.   Social History   Socioeconomic History   Marital status: Married    Spouse name: Irving Burton   Number of children: 2   Years of education: Not on file   Highest education level: Not on file  Occupational History   Occupation: self employed  Tobacco Use   Smoking status: Former    Packs/day: 1.00    Years: 10.00    Pack years: 10.00    Types: Cigarettes    Quit date: 08/05/2011    Years since quitting: 9.4   Smokeless tobacco: Former    Types: Snuff  Vaping Use   Vaping Use: Never used  Substance and Sexual Activity   Alcohol use: Yes    Comment: 11/14/2017 "nothing since pancreatitis dx'd in 2016"   Drug use: Not Currently   Sexual activity: Not on file  Other Topics Concern   Not on file  Social History Narrative   Not on file   Social Determinants of Health   Financial Resource Strain: Not on file  Food Insecurity: Not on file  Transportation Needs: Not on file   Physical Activity: Not on file  Stress: Not on file  Social Connections: Not on file     Family History: The patient's family history includes CAD in his paternal grandfather; Diabetes type I in his brother; Leukemia in his father; Lupus in his sister. There is no history of Heart disease.  ROS:   Please see the history of present illness.    All other systems reviewed and are negative.  EKGs/Labs/Other Studies Reviewed:    EKG:  EKG is ordered today.  The ekg ordered today demonstrates normal sinus rhythm 75 bpm, possible age-indeterminate inferior infarct, nonspecific ST and T  wave abnormality.  Recent Labs: 11/02/2020: ALT 22; BUN 18; Creatinine, Ser 0.82; Hemoglobin 18.0; Platelets 173; Potassium 4.0; Sodium 137  Recent Lipid Panel    Component Value Date/Time   CHOL 266 (H) 10/31/2019 0902   TRIG 1,344 (HH) 10/31/2019 0902   HDL 26 (L) 10/31/2019 0902   CHOLHDL 10.2 (H) 10/31/2019 0902   CHOLHDL 8.9 10/13/2014 0500   VLDL UNABLE TO CALCULATE IF TRIGLYCERIDE OVER 400 mg/dL 59/16/3846 6599   LDLCALC Comment (A) 10/31/2019 0902   LDLDIRECT 49 10/31/2019 0902     Risk Assessment/Calculations:           Physical Exam:    VS:  BP 112/68   Pulse 75   Ht 6' (1.829 m)   Wt 251 lb (113.9 kg)   SpO2 96%   BMI 34.04 kg/m     Wt Readings from Last 3 Encounters:  01/04/21 251 lb (113.9 kg)  11/02/20 250 lb (113.4 kg)  10/27/19 262 lb (118.8 kg)     GEN:  Well nourished, well developed in no acute distress HEENT: Normal NECK: No JVD; No carotid bruits LYMPHATICS: No lymphadenopathy CARDIAC: RRR, no murmurs, rubs, gallops RESPIRATORY:  Clear to auscultation without rales, wheezing or rhonchi  ABDOMEN: Soft, non-tender, non-distended MUSCULOSKELETAL:  No edema; No deformity  SKIN: Warm and dry, left radial artery harvest scar well-healed NEUROLOGIC:  Alert and oriented x 3 PSYCHIATRIC:  Normal affect   ASSESSMENT:    1. Coronary artery disease involving native  coronary artery of native heart without angina pectoris   2. S/P CABG x 3   3. Mixed hyperlipidemia   4. Essential hypertension   5. Male hypogonadism    PLAN:    In order of problems listed above:  The patient is stable without symptoms of angina.  He is physically active.  His medications are reviewed and will be continued without change.  He is on a high intensity statin drug and aspirin.  Also treated with a beta-blocker. As above, reviewed his operative report and he underwent CABG with arterial conduits using a LIMA to LAD, free RIMA to OM, and left radial to posterior descending. Lipids reviewed and demonstrate an LDL cholesterol of 18, HDL 28, triglycerides 520.  Patient with chronic hypertriglyceridemia, numbers improved from past.  Treated with fenofibrate and Vascepa. Blood pressure well controlled.  Continue lifestyle modification and current therapy with metoprolol. Longstanding testosterone supplementation noted.  We discussed the pros and cons of this today and the patient understands the potential long-term cardiovascular risk but this is a gray area.  He gets a lot of benefit for his overall health, energy level, and sense of wellbeing.  He will continue under the direction of his other physicians.         Medication Adjustments/Labs and Tests Ordered: Current medicines are reviewed at length with the patient today.  Concerns regarding medicines are outlined above.  Orders Placed This Encounter  Procedures   EKG 12-Lead    No orders of the defined types were placed in this encounter.   Patient Instructions  Medication Instructions:  Your physician recommends that you continue on your current medications as directed. Please refer to the Current Medication list given to you today.  *If you need a refill on your cardiac medications before your next appointment, please call your pharmacy*  Follow-Up: At [facility], you and your health needs are our priority.  As  part of our continuing mission to provide you with exceptional heart care,  we have created designated Provider Care Teams.  These Care Teams include your primary Cardiologist (physician) and Advanced Practice Providers (APPs -  Physician Assistants and Nurse Practitioners) who all work together to provide you with the care you need, when you need it.   Your next appointment:   1 year(s)  The format for your next appointment:   In Person  Provider:   Tonny Bollman, MD     Signed, Tonny Bollman, MD  01/04/2021 5:21 PM    [facility]

## 2021-01-06 ENCOUNTER — Other Ambulatory Visit: Payer: Self-pay | Admitting: Cardiovascular Disease

## 2021-01-06 ENCOUNTER — Other Ambulatory Visit: Payer: Self-pay | Admitting: Pharmacist

## 2021-01-06 MED ORDER — ICOSAPENT ETHYL 1 G PO CAPS: 2.0000 g | ORAL_CAPSULE | Freq: Two times a day (BID) | ORAL | 3 refills | Status: DC

## 2021-01-09 ENCOUNTER — Other Ambulatory Visit: Payer: Self-pay | Admitting: Cardiovascular Disease

## 2021-01-14 ENCOUNTER — Telehealth: Payer: Self-pay

## 2021-01-14 NOTE — Telephone Encounter (Signed)
P/A for Vascepa submitted on CoverMyMeds.  KEY: [id number]

## 2021-01-17 NOTE — Telephone Encounter (Signed)
Prior authorization for Andre Ford has been approved.  For patients covered by Medicare Part D, you may submit a tier exception form to the plan or PBM. If approved by the plan or PBM, it may reduce the patient's out-of-pocket costs. Type "tier exception" in the "plan or PBM" field on the Form Pick page to view eligible tier exception forms.  Message from plan: Effective from 01/14/2021 through 01/13/2022.

## 2021-01-26 ENCOUNTER — Other Ambulatory Visit: Payer: Self-pay | Admitting: Cardiovascular Disease

## 2021-01-29 DIAGNOSIS — S0501XA Injury of conjunctiva and corneal abrasion without foreign body, right eye, initial encounter: Secondary | ICD-10-CM | POA: Diagnosis not present

## 2021-02-21 DIAGNOSIS — E1159 Type 2 diabetes mellitus with other circulatory complications: Secondary | ICD-10-CM | POA: Diagnosis not present

## 2021-02-21 DIAGNOSIS — Z125 Encounter for screening for malignant neoplasm of prostate: Secondary | ICD-10-CM | POA: Diagnosis not present

## 2021-02-21 DIAGNOSIS — K760 Fatty (change of) liver, not elsewhere classified: Secondary | ICD-10-CM | POA: Diagnosis not present

## 2021-02-21 DIAGNOSIS — I10 Essential (primary) hypertension: Secondary | ICD-10-CM | POA: Diagnosis not present

## 2021-02-21 DIAGNOSIS — E785 Hyperlipidemia, unspecified: Secondary | ICD-10-CM | POA: Diagnosis not present

## 2021-02-21 DIAGNOSIS — E291 Testicular hypofunction: Secondary | ICD-10-CM | POA: Diagnosis not present

## 2021-06-27 ENCOUNTER — Other Ambulatory Visit: Payer: Self-pay | Admitting: Cardiovascular Disease

## 2021-07-19 ENCOUNTER — Telehealth: Payer: Self-pay | Admitting: Cardiovascular Disease

## 2021-07-19 NOTE — Telephone Encounter (Signed)
Pt c/o of Chest Pain: STAT if CP now or developed within 24 hours  1. Are you having CP right now? Burning sensation and discomfort in his back- felt like it did when he had to end up having heart surgery in 2018   2. Are you experiencing any other symptoms (ex. SOB, nausea, vomiting, sweating)?  A little sweating and a little abxious  3. How long have you been experiencing CP? about a week  4. Is your CP continuous or coming and going? Discomfort comes and goes  5. Have you taken Nitroglycerin? No- patient wanted an appointment- made an appointment for Thursday with Samara Deist  ?

## 2021-07-19 NOTE — Telephone Encounter (Signed)
Over the last week - pain mid back with some radiation across both shoulders.  This occurs at rest or during activity.  Not get worse with activity.   Goes away or improves slightly when he gets up and walks.  Bps 135/80, HRs 80.  The back symptoms feel similar as with prior cardiac symptoms but not as significant.  Previously activity would make symptoms worse.  Pain in left armpit that can happen at different times altogether.  This is not worse with activity either.   No SOB.  No heart racing or pounding.  No chest tightness.  He has been sick to his stomach for the last 3-4 days.   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   Pain everywhere including going down into arm when took deep breath for me on the phone.  He was in [place] at the end of March and he walked over 11,000 steps and pushed  stroller and felt fine.    We discussed ER precautions.  He is aware to come to [facility] ER for worsening symptoms or symptoms that are accompanied by chest pressure, SOB or heart racing.  He voices understanding.  Appt is 07/21/21 with APP.

## 2021-07-19 NOTE — Progress Notes (Signed)
Cardiology Office Note:    Date:  07/21/2021   ID:  Andre Ford, DOB 08/02/1980, MRN [id number]  PCP:  Adrian PrinceSouth, Stephen, MD   [facility] Providers Cardiologist:  Tonny BollmanMichael Cooper, MD     Referring MD: Adrian PrinceSouth, Stephen, MD   Follow up  History of Present Illness:    Andre Ford is a 41 y.o. male with a hx of CAD s/p CABGx3 (2019), HTN, HLD with hypertriglyceridemia, chroinc pancreatitis, DMT2, obesity and hypogonadism who presents to clnic for follow up.  He was admitted in 2016 for acute pancreatitis 2/2 hypertriglyceridemia in the setting of alcohol binge.   He underwent CABG with arterial conduits using a LIMA to LAD, free RIMA to OM, and left radial to posterior descending by Dr. Dorris FetchHendrickson in 10/2017. Prior to this he was having exertional back and shoulder pain.  He was readmitted in 12/2017 for chest pain. He ruled out for MI. Echo showed normal EF 50-55% without WMAs, mild RV dysfunction and trivial pericardial effusion.  He was last seen by Dr. Excell Seltzerooper in 12/2020 and doing well. Lipids reviewed and demonstrate an LDL cholesterol of 18, HDL 28, triglycerides 520.  Patient with chronic hypertriglyceridemia, numbers improved from past.  Treated with fenofibrate and Vascepa. There was discussion about risk/benefits to TRT. He was continued on his testosterone.   Today the patient presents to clinic for follow up. He has been dealing with 6 months of fatigue. He has been having neck and back pain which is not exertional and related to movements. He is very anxious about it being his heart because he had atypical symptoms prior to his bypass surgery. He wants to be alive to watch his kids grow. He was taken off his Tesosteroe replacement because his testosterone was recently too high.  He thinks this might be contributing to his fatigue as well.  He also has diffuse body pain.  He wonders if he has fibromyalgia. No SOB. No LE edema, orthopnea or PND. No dizziness or syncope. No blood  in stool or urine. No palpitations.  He remains very active at work and with his kids.   Past Medical History:  Diagnosis Date   Anxiety    Arthritis    "qwhere" (11/14/2017)   At risk for sleep apnea    STOP-BANG= 6     SENT TO PCP 08-05-2014   Chest pain, pleuritic 01/10/2018   Chronic pancreatitis (HCC)    Coronary artery disease    Depression    High triglycerides    History of kidney stones    Hyperlipidemia    Hypertension    "lost wright and this went away" (11/14/2017)   Obesity    Type II diabetes mellitus (HCC)    "dx'd 08/2017" (11/14/2017)   Urethral stricture     Past Surgical History:  Procedure Laterality Date   APPENDECTOMY  age 89   CARDIAC CATHETERIZATION     CORONARY ARTERY BYPASS GRAFT N/A 11/16/2017   Procedure: CORONARY ARTERY BYPASS GRAFTING (CABG) x 3.;  Surgeon: Loreli SlotHendrickson, Steven C, MD;  Location: [facility] OR;  Service: Open Heart Surgery;  Laterality: N/A;  BILATERAL IMAS   CYSTOSCOPY WITH URETHRAL DILATATION N/A 08/10/2014   Procedure: CYSTOSCOPY WITH URETHRAL DILATATION;  Surgeon: Malen GauzePatrick L McKenzie, MD;  [facility];  Service: Urology;  Laterality: N/A;   CYSTOSCOPY WITH URETHRAL DILATATION N/A 11/16/2017   Procedure: CYSTOSCOPY WITH URETHRAL DILATATION;  Surgeon: Loreli SlotHendrickson, Steven C, MD;  [facility];  Service: Open  Heart Surgery;  Laterality: N/A;  performed by Dr. Ronne Binning   FRACTURE SURGERY     KNEE ARTHROSCOPY WITH ANTERIOR CRUCIATE LIGAMENT (ACL) REPAIR Left 2000   LEFT HEART CATH AND CORONARY ANGIOGRAPHY N/A 11/12/2017   Procedure: LEFT HEART CATH AND CORONARY ANGIOGRAPHY;  Surgeon: Elder Negus, MD;  Location: [facility] INVASIVE CV LAB;  Service: Cardiovascular;  Laterality: N/A;   OPEN REDUCTION INTERNAL FIXATION (ORIF) HAND Right 2000   "boxer fx"   PENILE BIOPSY N/A 08/10/2014   Procedure: Core biopsy of penile glands lesion;  Surgeon: Malen Gauze, MD;  Location: [facility];  Service:  Urology;  Laterality: N/A;   RADIAL ARTERY HARVEST Left 11/16/2017   Procedure: RADIAL ARTERY HARVEST;  Surgeon: Loreli Slot, MD;  Location: [facility] OR;  Service: Open Heart Surgery;  Laterality: Left;   TEE WITHOUT CARDIOVERSION N/A 11/16/2017   Procedure: TRANSESOPHAGEAL ECHOCARDIOGRAM (TEE);  Surgeon: Loreli Slot, MD;  Location: [facility] OR;  Service: Open Heart Surgery;  Laterality: N/A;   URETHROPLASTY  2016    Current Medications: Current Meds  Medication Sig   ALPRAZolam (XANAX) 1 MG tablet Take 1 mg by mouth 2 (two) times daily as needed.   aspirin EC 81 MG tablet Take 1 tablet (81 mg total) by mouth daily.   escitalopram (LEXAPRO) 10 MG tablet Take 10 mg by mouth daily.   fenofibrate micronized (LOFIBRA) 134 MG capsule Take 1 capsule (134 mg total) by mouth daily.   icosapent Ethyl (VASCEPA) 1 g capsule Take 2 capsules (2 g total) by mouth 2 (two) times daily.   JARDIANCE 25 MG TABS tablet Take 25 mg by mouth daily.   metFORMIN (GLUCOPHAGE) 1000 MG tablet Take 1,000 mg by mouth 2 (two) times daily.   metoprolol tartrate (LOPRESSOR) 50 MG tablet TAKE 1 TABLET BY MOUTH TWICE A DAY   rosuvastatin (CRESTOR) 20 MG tablet Take 1 tablet (20 mg total) by mouth daily.     Allergies:   Patient has no known allergies.   Social History   Socioeconomic History   Marital status: Married    Spouse name: Irving Burton   Number of children: 2   Years of education: Not on file   Highest education level: Not on file  Occupational History   Occupation: self employed  Tobacco Use   Smoking status: Former    Packs/day: 1.00    Years: 10.00    Total pack years: 10.00    Types: Cigarettes    Quit date: 08/05/2011    Years since quitting: 9.9   Smokeless tobacco: Former    Types: Snuff  Vaping Use   Vaping Use: Never used  Substance and Sexual Activity   Alcohol use: Yes    Comment: 11/14/2017 "nothing since pancreatitis dx'd in 2016"   Drug use: Not Currently   Sexual activity: Not  on file  Other Topics Concern   Not on file  Social History Narrative   Not on file   Social Determinants of Health   Financial Resource Strain: Not on file  Food Insecurity: No Food Insecurity (01/31/2018)   Hunger Vital Sign    Worried About Running Out of Food in the Last Year: Never true    Ran Out of Food in the Last Year: Never true  Transportation Needs: No Transportation Needs (01/31/2018)   PRAPARE - Administrator, Civil Service (Medical): No    Lack of Transportation (Non-Medical): No  Physical Activity: Not on file  Stress: Stress Concern Present (01/31/2018)   [employer] of Occupational Health - Occupational Stress Questionnaire    Feeling of Stress : To some extent  Social Connections: Not on file     Family History: The patient's family history includes CAD in his paternal grandfather; Diabetes type I in his brother; Leukemia in his father; Lupus in his sister. There is no history of Heart disease.  ROS:   Please see the history of present illness.     All other systems reviewed and are negative.  EKGs/Labs/Other Studies Reviewed:    The following studies were reviewed today:  01/11/18 Study Conclusions - Left ventricle: The cavity size was normal. There was mild    concentric hypertrophy. Systolic function was normal. The    estimated ejection fraction was in the range of 50% to 55%. Wall    motion was normal; there were no regional wall motion    abnormalities. Left ventricular diastolic function parameters    were normal.  - Ventricular septum: These changes are consistent with a    post-thoracotomy state.  - Right ventricle: Systolic function was mildly reduced.  - Pericardium, extracardiac: A trivial pericardial effusion was    identified posterior to the heart.   Impressions:   - Normal LV EF without wall motion abnormalities. Mild reduction in    RV function. Trivial posterior pericardial effusion. Echo    contrast used to better  define wall motion.   EKG:  EKG is ordered today.  The ekg ordered today demonstrates sinus with non specific ST/TW abnormality. HR 69  Recent Labs: 11/02/2020: ALT 22; BUN 18; Creatinine, Ser 0.82; Hemoglobin 18.0; Platelets 173; Potassium 4.0; Sodium 137  Recent Lipid Panel    Component Value Date/Time   CHOL 266 (H) 10/31/2019 0902   TRIG 1,344 (HH) 10/31/2019 0902   HDL 26 (L) 10/31/2019 0902   CHOLHDL 10.2 (H) 10/31/2019 0902   CHOLHDL 8.9 10/13/2014 0500   VLDL UNABLE TO CALCULATE IF TRIGLYCERIDE OVER 400 mg/dL 35/57/3220 2542   LDLCALC Comment (A) 10/31/2019 0902   LDLDIRECT 49 10/31/2019 0902     Risk Assessment/Calculations:           Physical Exam:    VS:  BP 108/74   Pulse 69   Ht 6' (1.829 m)   Wt 246 lb 12.8 oz (111.9 kg)   SpO2 98%   BMI 33.47 kg/m     Wt Readings from Last 3 Encounters:  07/21/21 246 lb 12.8 oz (111.9 kg)  01/04/21 251 lb (113.9 kg)  11/02/20 250 lb (113.4 kg)     GEN:  Well nourished, well developed in no acute distress HEENT: Normal NECK: No JVD; No carotid bruits LYMPHATICS: No lymphadenopathy CARDIAC: RRR, no murmurs, rubs, gallops RESPIRATORY:  Clear to auscultation without rales, wheezing or rhonchi  ABDOMEN: Soft, non-tender, non-distended MUSCULOSKELETAL:  No edema; No deformity  SKIN: Warm and dry NEUROLOGIC:  Alert and oriented x 3 PSYCHIATRIC:  Normal affect   ASSESSMENT:    1. S/P CABG x 3   2. Mixed hyperlipidemia   3. Essential hypertension   4. Male hypogonadism   5. Obesity (BMI 30.0-34.9)   6. Other chest pain    PLAN:    In order of problems listed above:  CAD s/p CABG: Doing well with no chest pain.  His previous anginal symptoms were atypical with back pain.  He has been having some neck and back pain, however they are nonexertional.  He is very worried  he could be missing cardiac symptoms and wants further work-up.  I have arranged for a cardiac PET stress test.  If this is booking too far out, we  will convert this to a traditional nuclear stress test.  Continue aspirin and statin.  HLD with hypertriglyceridemia: Lipids are well controlled.  Labs recently done by Dr. Evlyn Kanner.  I do not have access to these today.  He says his triglycerides are still elevated but much better than previous.  Continue statin, Vascepa and fenofibrate.  HTN: Blood pressure is well controlled today.  No changes made.  Hypogonadism: He was recently taken off his testosterone replacement therapy due to his levels being too high after taking the wrong dose.  He would like to go back on TRT.  I think that that is okay as long as he is making sure to stay in the normal range and gets routine blood work to watch his hematocrit.  Obesity: Body mass index is 33.47 kg/m.  He has a history of diabetes.  He is interested in the SGLT ones for weight loss.  I will refer him to the Pharm.D. clinic to see if we can get him started on Ozempic.      Shared Decision Making/Informed Consent The risks [chest pain, shortness of breath, cardiac arrhythmias, dizziness, blood pressure fluctuations, myocardial infarction, stroke/transient ischemic attack, nausea, vomiting, allergic reaction, radiation exposure, metallic taste sensation and life-threatening complications (estimated to be 1 in 10,000)], benefits (risk stratification, diagnosing coronary artery disease, treatment guidance) and alternatives of a cardiac PET stress test were discussed in detail with Mr. Pola and he agrees to proceed.    Medication Adjustments/Labs and Tests Ordered: Current medicines are reviewed at length with the patient today.  Concerns regarding medicines are outlined above.  Orders Placed This Encounter  Procedures   NM PET CT CARDIAC PERFUSION MULTI W/ABSOLUTE BLOODFLOW   AMB Referral to [facility] Pharm-D   EKG 12-Lead   No orders of the defined types were placed in this encounter.   Patient Instructions  Medication Instructions:  Your  physician recommends that you continue on your current medications as directed. Please refer to the Current Medication list given to you today.  *If you need a refill on your cardiac medications before your next appointment, please call your pharmacy*   Lab Work: None ordered   If you have labs (blood work) drawn today and your tests are completely normal, you will receive your results only by: MyChart Message (if you have MyChart) OR A paper copy in the mail If you have any lab test that is abnormal or we need to change your treatment, we will call you to review the results.   Testing/Procedures: Your physician has referred you to see our pharmacist in the lipid clinic   Your physician has ordered for you to have a PET scan. Please refer to instructions given.    Follow-Up: At [facility], you and your health needs are our priority.  As part of our continuing mission to provide you with exceptional heart care, we have created designated Provider Care Teams.  These Care Teams include your primary Cardiologist (physician) and Advanced Practice Providers (APPs -  Physician Assistants and Nurse Practitioners) who all work together to provide you with the care you need, when you need it.  We recommend signing up for the patient portal called "MyChart".  Sign up information is provided on this After Visit Summary.  MyChart is used to connect with patients for Virtual  Visits (Telemedicine).  Patients are able to view lab/test results, encounter notes, upcoming appointments, etc.  Non-urgent messages can be sent to your provider as well.   To learn more about what you can do with MyChart, go to [email or web address].    Your next appointment:   6 month(s)  The format for your next appointment:   In Person  Provider:   Tonny Bollman, MD     Other Instructions How to Prepare for Your Cardiac PET/CT Stress Test:  1. Please do not take these medications before your test:       Hold  Metoprolol the morning of your test   2. Nothing to eat or drink, except water, 3 hours prior to arrival time.   NO caffeine/decaffeinated products, or chocolate 12 hours prior to arrival.  3. NO perfume, cologne or lotion  4. Total time is 1 to 2 hours; you may want to bring reading material for the waiting time.  5. Please report to Admitting at the [facility] Main Entrance 60 minutes early for your test.  [address] [place], [place] [address]  Diabetic Preparation:  Hold Jardiance the morning of your test   In preparation for your appointment, medication and supplies will be purchased.  Appointment availability is limited, so if you need to cancel or reschedule, please call the Radiology Department at [phone]  24 hours in advance to avoid a cancellation fee of $100.00  What to Expect After you Arrive:  Once you arrive and check in for your appointment, you will be taken to a preparation room within the Radiology Department.  A technologist or Nurse will obtain your medical history, verify that you are correctly prepped for the exam, and explain the procedure.  Afterwards,  an IV will be started in your arm and electrodes will be placed on your skin for EKG monitoring during the stress portion of the exam. Then you will be escorted to the PET/CT scanner.  There, staff will get you positioned on the scanner and obtain a blood pressure and EKG.  During the exam, you will continue to be connected to the EKG and blood pressure machines.  A small, safe amount of a radioactive tracer will be injected in your IV to obtain a series of pictures of your heart along with an injection of a stress agent.    After your Exam:  It is recommended that you eat a meal and drink a caffeinated beverage to counter act any effects of the stress agent.  Drink plenty of fluids for the remainder of the day and urinate frequently for the first couple of hours after the exam.  Your doctor will  inform you of your test results within 7-10 business days.  For questions about your test or how to prepare for your test, please call: Rockwell Alexandria, Cardiac Imaging Nurse Navigator  Larey Brick, Cardiac Imaging Nurse Navigator Office: [phone]          Signed, Cline Crock, Cordelia Poche  07/21/2021 10:35 AM    [facility]

## 2021-07-21 ENCOUNTER — Encounter: Payer: Self-pay | Admitting: Physician Assistant

## 2021-07-21 ENCOUNTER — Ambulatory Visit: Payer: BC Managed Care – PPO | Admitting: Physician Assistant

## 2021-07-21 VITALS — BP 108/74 | HR 69 | Ht 72.0 in | Wt 246.8 lb

## 2021-07-21 DIAGNOSIS — E669 Obesity, unspecified: Secondary | ICD-10-CM

## 2021-07-21 DIAGNOSIS — Z951 Presence of aortocoronary bypass graft: Secondary | ICD-10-CM

## 2021-07-21 DIAGNOSIS — E291 Testicular hypofunction: Secondary | ICD-10-CM

## 2021-07-21 DIAGNOSIS — I10 Essential (primary) hypertension: Secondary | ICD-10-CM

## 2021-07-21 DIAGNOSIS — R0789 Other chest pain: Secondary | ICD-10-CM

## 2021-07-21 DIAGNOSIS — E66811 Obesity, class 1: Secondary | ICD-10-CM

## 2021-07-21 DIAGNOSIS — E782 Mixed hyperlipidemia: Secondary | ICD-10-CM

## 2021-07-21 NOTE — Patient Instructions (Signed)
Medication Instructions:  Your physician recommends that you continue on your current medications as directed. Please refer to the Current Medication list given to you today.  *If you need a refill on your cardiac medications before your next appointment, please call your pharmacy*   Lab Work: None ordered   If you have labs (blood work) drawn today and your tests are completely normal, you will receive your results only by: MyChart Message (if you have MyChart) OR A paper copy in the mail If you have any lab test that is abnormal or we need to change your treatment, we will call you to review the results.   Testing/Procedures: Your physician has referred you to see our pharmacist in the lipid clinic   Your physician has ordered for you to have a PET scan. Please refer to instructions given.    Follow-Up: At [facility], you and your health needs are our priority.  As part of our continuing mission to provide you with exceptional heart care, we have created designated Provider Care Teams.  These Care Teams include your primary Cardiologist (physician) and Advanced Practice Providers (APPs -  Physician Assistants and Nurse Practitioners) who all work together to provide you with the care you need, when you need it.  We recommend signing up for the patient portal called "MyChart".  Sign up information is provided on this After Visit Summary.  MyChart is used to connect with patients for Virtual Visits (Telemedicine).  Patients are able to view lab/test results, encounter notes, upcoming appointments, etc.  Non-urgent messages can be sent to your provider as well.   To learn more about what you can do with MyChart, go to [email or web address].    Your next appointment:   6 month(s)  The format for your next appointment:   In Person  Provider:   Tonny Bollman, MD     Other Instructions How to Prepare for Your Cardiac PET/CT Stress Test:  1. Please do not take these  medications before your test:       Hold Metoprolol the morning of your test   2. Nothing to eat or drink, except water, 3 hours prior to arrival time.   NO caffeine/decaffeinated products, or chocolate 12 hours prior to arrival.  3. NO perfume, cologne or lotion  4. Total time is 1 to 2 hours; you may want to bring reading material for the waiting time.  5. Please report to Admitting at the [facility] Main Entrance 60 minutes early for your test.  [address] [place], [place] [address]  Diabetic Preparation:  Hold Jardiance the morning of your test   In preparation for your appointment, medication and supplies will be purchased.  Appointment availability is limited, so if you need to cancel or reschedule, please call the Radiology Department at [phone]  24 hours in advance to avoid a cancellation fee of $100.00  What to Expect After you Arrive:  Once you arrive and check in for your appointment, you will be taken to a preparation room within the Radiology Department.  A technologist or Nurse will obtain your medical history, verify that you are correctly prepped for the exam, and explain the procedure.  Afterwards,  an IV will be started in your arm and electrodes will be placed on your skin for EKG monitoring during the stress portion of the exam. Then you will be escorted to the PET/CT scanner.  There, staff will get you positioned on the scanner and obtain a  blood pressure and EKG.  During the exam, you will continue to be connected to the EKG and blood pressure machines.  A small, safe amount of a radioactive tracer will be injected in your IV to obtain a series of pictures of your heart along with an injection of a stress agent.    After your Exam:  It is recommended that you eat a meal and drink a caffeinated beverage to counter act any effects of the stress agent.  Drink plenty of fluids for the remainder of the day and urinate frequently for the first couple of  hours after the exam.  Your doctor will inform you of your test results within 7-10 business days.  For questions about your test or how to prepare for your test, please call: Rockwell Alexandria, Cardiac Imaging Nurse Navigator  Larey Brick, Cardiac Imaging Nurse Navigator Office: [phone]

## 2021-07-22 DIAGNOSIS — M542 Cervicalgia: Secondary | ICD-10-CM | POA: Diagnosis not present

## 2021-07-25 ENCOUNTER — Emergency Department (HOSPITAL_COMMUNITY)
Admission: EM | Admit: 2021-07-25 | Discharge: 2021-07-26 | Disposition: A | Discharge status: Home / Self Care | Payer: BC Managed Care – PPO | Attending: Emergency Medicine | Admitting: Emergency Medicine

## 2021-07-25 ENCOUNTER — Encounter (HOSPITAL_COMMUNITY): Payer: Self-pay

## 2021-07-25 ENCOUNTER — Other Ambulatory Visit: Payer: Self-pay

## 2021-07-25 DIAGNOSIS — I700 Atherosclerosis of aorta: Secondary | ICD-10-CM | POA: Diagnosis not present

## 2021-07-25 DIAGNOSIS — Z951 Presence of aortocoronary bypass graft: Secondary | ICD-10-CM

## 2021-07-25 DIAGNOSIS — I2510 Atherosclerotic heart disease of native coronary artery without angina pectoris: Secondary | ICD-10-CM | POA: Insufficient documentation

## 2021-07-25 DIAGNOSIS — Z79899 Other long term (current) drug therapy: Secondary | ICD-10-CM | POA: Insufficient documentation

## 2021-07-25 DIAGNOSIS — E119 Type 2 diabetes mellitus without complications: Secondary | ICD-10-CM | POA: Diagnosis not present

## 2021-07-25 DIAGNOSIS — Z7984 Long term (current) use of oral hypoglycemic drugs: Secondary | ICD-10-CM | POA: Insufficient documentation

## 2021-07-25 DIAGNOSIS — R1013 Epigastric pain: Secondary | ICD-10-CM | POA: Diagnosis not present

## 2021-07-25 DIAGNOSIS — K2980 Duodenitis without bleeding: Secondary | ICD-10-CM | POA: Insufficient documentation

## 2021-07-25 DIAGNOSIS — Z7982 Long term (current) use of aspirin: Secondary | ICD-10-CM | POA: Diagnosis not present

## 2021-07-25 DIAGNOSIS — R109 Unspecified abdominal pain: Secondary | ICD-10-CM | POA: Diagnosis not present

## 2021-07-25 DIAGNOSIS — I10 Essential (primary) hypertension: Secondary | ICD-10-CM | POA: Diagnosis not present

## 2021-07-25 DIAGNOSIS — K760 Fatty (change of) liver, not elsewhere classified: Secondary | ICD-10-CM | POA: Diagnosis not present

## 2021-07-25 LAB — CBC WITH DIFFERENTIAL/PLATELET
Abs Immature Granulocytes: 0.05 10*3/uL (ref 0.00–0.07)
Basophils Absolute: 0 10*3/uL (ref 0.0–0.1)
Basophils Relative: 1 %
Eosinophils Absolute: 0.1 10*3/uL (ref 0.0–0.5)
Eosinophils Relative: 1 %
HCT: 48 % (ref 39.0–52.0)
Hemoglobin: 17.3 g/dL — ABNORMAL HIGH (ref 13.0–17.0)
Immature Granulocytes: 1 %
Lymphocytes Relative: 36 %
Lymphs Abs: 3 10*3/uL (ref 0.7–4.0)
MCH: 30.9 pg (ref 26.0–34.0)
MCHC: 36 g/dL (ref 30.0–36.0)
MCV: 85.7 fL (ref 80.0–100.0)
Monocytes Absolute: 0.6 10*3/uL (ref 0.1–1.0)
Monocytes Relative: 7 %
Neutro Abs: 4.5 10*3/uL (ref 1.7–7.7)
Neutrophils Relative %: 54 %
Platelets: 154 10*3/uL (ref 150–400)
RBC: 5.6 MIL/uL (ref 4.22–5.81)
RDW: 12.7 % (ref 11.5–15.5)
WBC: 8.2 10*3/uL (ref 4.0–10.5)
nRBC: 0 % (ref 0.0–0.2)

## 2021-07-25 LAB — COMPREHENSIVE METABOLIC PANEL
ALT: 26 U/L (ref 0–44)
AST: 20 U/L (ref 15–41)
Albumin: 4.5 g/dL (ref 3.5–5.0)
Alkaline Phosphatase: 70 U/L (ref 38–126)
Anion gap: 12 (ref 5–15)
BUN: 15 mg/dL (ref 6–20)
CO2: 22 mmol/L (ref 22–32)
Calcium: 9.6 mg/dL (ref 8.9–10.3)
Chloride: 101 mmol/L (ref 98–111)
Creatinine, Ser: 0.8 mg/dL (ref 0.61–1.24)
GFR, Estimated: >60 mL/min (ref >60)
Glucose, Bld: 106 mg/dL — ABNORMAL HIGH (ref 70–99)
Potassium: 3.9 mmol/L (ref 3.5–5.1)
Sodium: 135 mmol/L (ref 135–145)
Total Bilirubin: 2.6 mg/dL — ABNORMAL HIGH (ref 0.3–1.2)
Total Protein: 7.6 g/dL (ref 6.5–8.1)

## 2021-07-25 LAB — LIPASE, BLOOD: Lipase: 30 U/L (ref 11–51)

## 2021-07-26 ENCOUNTER — Emergency Department (HOSPITAL_COMMUNITY): Payer: BC Managed Care – PPO

## 2021-07-26 DIAGNOSIS — I700 Atherosclerosis of aorta: Secondary | ICD-10-CM | POA: Diagnosis not present

## 2021-07-26 DIAGNOSIS — K760 Fatty (change of) liver, not elsewhere classified: Secondary | ICD-10-CM | POA: Diagnosis not present

## 2021-07-26 DIAGNOSIS — R109 Unspecified abdominal pain: Secondary | ICD-10-CM | POA: Diagnosis not present

## 2021-07-26 LAB — TROPONIN I (HIGH SENSITIVITY)
Troponin I (High Sensitivity): 4 ng/L (ref <18)
Troponin I (High Sensitivity): 3 ng/L (ref <18)

## 2021-07-26 MED ORDER — MORPHINE SULFATE (PF) 4 MG/ML IV SOLN
4.0000 mg | Freq: Once | INTRAVENOUS | Status: AC
  Administered 2021-07-26: 4 mg via INTRAVENOUS
  Filled 2021-07-26: qty 1

## 2021-07-26 MED ORDER — ALUM & MAG HYDROXIDE-SIMETH 200-200-20 MG/5ML PO SUSP
30.0000 mL | Freq: Once | ORAL | Status: AC
  Administered 2021-07-26: 30 mL via ORAL
  Filled 2021-07-26: qty 30

## 2021-07-26 MED ORDER — IOHEXOL 300 MG/ML  SOLN
100.0000 mL | Freq: Once | INTRAMUSCULAR | Status: AC | PRN
  Administered 2021-07-26: 100 mL via INTRAVENOUS

## 2021-07-26 MED ORDER — OMEPRAZOLE 20 MG PO CPDR: 20.0000 mg | DELAYED_RELEASE_CAPSULE | Freq: Every day | ORAL | 0 refills | Status: DC

## 2021-07-26 MED ORDER — SUCRALFATE 1 G PO TABS
1.0000 g | ORAL_TABLET | Freq: Once | ORAL | Status: AC
  Administered 2021-07-26: 1 g via ORAL
  Filled 2021-07-26: qty 1

## 2021-07-26 MED ORDER — ONDANSETRON HCL 4 MG/2ML IJ SOLN
4.0000 mg | Freq: Once | INTRAMUSCULAR | Status: AC
  Administered 2021-07-26: 4 mg via INTRAVENOUS
  Filled 2021-07-26: qty 2

## 2021-07-26 MED ORDER — PANTOPRAZOLE SODIUM 40 MG IV SOLR
40.0000 mg | Freq: Once | INTRAVENOUS | Status: AC
  Administered 2021-07-26: 40 mg via INTRAVENOUS
  Filled 2021-07-26: qty 10

## 2021-07-26 MED ORDER — LIDOCAINE VISCOUS HCL 2 % MT SOLN
15.0000 mL | Freq: Once | OROMUCOSAL | Status: AC
  Administered 2021-07-26: 15 mL via ORAL
  Filled 2021-07-26: qty 15

## 2021-07-26 NOTE — ED Provider Notes (Signed)
[facility] EMERGENCY DEPARTMENT Provider Note   CSN: [id number] Arrival date & time: 07/25/21  2015     History  Chief Complaint  Patient presents with   Abdominal Pain    Andre Ford is a 41 y.o. male.  HPI     This is a 41 year old male with a history of pancreatitis, coronary artery disease, hypertriglyceridemia who presents with epigastric pain.  Patient reports 2-week history of worsening epigastric discomfort.  He states that it radiates to his back.  He believes this may be his pancreatitis.  He denies any current drinking.  States his prior pancreatitis was related to hypertriglyceridemia.  Patient has been seen his back doctor and his cardiologist for pain given that the pain radiated to his back.  He has been set up for stress test.  Patient states pain is associated with eating.  He reports nausea without vomiting.  No fevers.  Home Medications Prior to Admission medications   Medication Sig Start Date End Date Taking? Authorizing Provider  omeprazole (PRILOSEC) 20 MG capsule Take 1 capsule (20 mg total) by mouth daily. 07/26/21  Yes Kiran Lapine, Mayer Masker, MD  ALPRAZolam Prudy Feeler) 1 MG tablet Take 1 mg by mouth 2 (two) times daily as needed. 12/22/20   [provider]  aspirin EC 81 MG tablet Take 1 tablet (81 mg total) by mouth daily. 04/30/19   Bhagat, Sharrell Ku, PA  escitalopram (LEXAPRO) 10 MG tablet Take 10 mg by mouth daily. 10/30/17   [provider]  fenofibrate micronized (LOFIBRA) 134 MG capsule Take 1 capsule (134 mg total) by mouth daily. 01/26/21   Tonny Bollman, MD  icosapent Ethyl (VASCEPA) 1 g capsule Take 2 capsules (2 g total) by mouth 2 (two) times daily. 01/06/21   Tonny Bollman, MD  JARDIANCE 25 MG TABS tablet Take 25 mg by mouth daily. 11/04/17   [provider]  metFORMIN (GLUCOPHAGE) 1000 MG tablet Take 1,000 mg by mouth 2 (two) times daily. 10/17/19   [provider]  metoprolol tartrate  (LOPRESSOR) 50 MG tablet TAKE 1 TABLET BY MOUTH TWICE A DAY 06/30/21   Tonny Bollman, MD  rosuvastatin (CRESTOR) 20 MG tablet Take 1 tablet (20 mg total) by mouth daily. 01/11/21   Tonny Bollman, MD      Allergies    Patient has no known allergies.    Review of Systems   Review of Systems  Constitutional:  Negative for fever.  Gastrointestinal:  Positive for abdominal pain and nausea. Negative for vomiting.  All other systems reviewed and are negative.   Physical Exam Updated Vital Signs BP 121/77   Pulse 70   Temp 97.8 F (36.6 C) (Oral)   Resp 18   Ht 1.829 m (6')   Wt 110.2 kg   SpO2 97%   BMI 32.96 kg/m  Physical Exam Vitals and nursing note reviewed.  Constitutional:      Appearance: He is well-developed. He is not ill-appearing.  HENT:     Head: Normocephalic and atraumatic.     Mouth/Throat:     Mouth: Mucous membranes are moist.  Eyes:     Pupils: Pupils are equal, round, and reactive to light.  Cardiovascular:     Rate and Rhythm: Normal rate and regular rhythm.     Heart sounds: Normal heart sounds. No murmur heard.    Comments: Prior midline sternotomy scar well-healing Pulmonary:     Effort: Pulmonary effort is normal. No respiratory distress.  Breath sounds: Normal breath sounds. No wheezing.  Abdominal:     General: Bowel sounds are normal.     Palpations: Abdomen is soft.     Tenderness: There is abdominal tenderness. There is guarding. There is no rebound.  Musculoskeletal:     Cervical back: Neck supple.  Lymphadenopathy:     Cervical: No cervical adenopathy.  Skin:    General: Skin is warm and dry.  Neurological:     General: No focal deficit present.     Mental Status: He is alert and oriented to person, place, and time.     ED Results / Procedures / Treatments   Labs (all labs ordered are listed, but only abnormal results are displayed) Labs Reviewed  COMPREHENSIVE METABOLIC PANEL - Abnormal; Notable for the following components:       Result Value   Glucose, Bld 106 (*)    Total Bilirubin 2.6 (*)    All other components within normal limits  CBC WITH DIFFERENTIAL/PLATELET - Abnormal; Notable for the following components:   Hemoglobin 17.3 (*)    All other components within normal limits  LIPASE, BLOOD  TROPONIN I (HIGH SENSITIVITY)  TROPONIN I (HIGH SENSITIVITY)    EKG None  Radiology CT ABDOMEN PELVIS W CONTRAST  Result Date: 07/26/2021 CLINICAL DATA:  Abdominal pain, acute, nonlocalized. Nausea, upper abdominal pain. EXAM: CT ABDOMEN AND PELVIS WITH CONTRAST TECHNIQUE: Multidetector CT imaging of the abdomen and pelvis was performed using the standard protocol following bolus administration of intravenous contrast. RADIATION DOSE REDUCTION: This exam was performed according to the departmental dose-optimization program which includes automated exposure control, adjustment of the mA and/or kV according to patient size and/or use of iterative reconstruction technique. CONTRAST:  OMNIPAQUE IOHEXOL 300 MG/ML  SOLN COMPARISON:  01/24/2017 FINDINGS: Lower chest: No acute abnormality. Hepatobiliary: Mild hepatic steatosis. No enhancing intrahepatic mass. No intra or extrahepatic biliary ductal dilation. Gallbladder unremarkable. Pancreas: Unremarkable Spleen: Unremarkable Adrenals/Urinary Tract: Adrenal glands are unremarkable. Kidneys are normal, without renal calculi, focal lesion, or hydronephrosis. Bladder is unremarkable. Stomach/Bowel: The stomach is unremarkable. There are periduodenal inflammatory changes involving the second and proximal third portion of the duodenum in keeping with an infectious or inflammatory duodenitis. There is no evidence of obstruction or perforation. The small and large bowel are otherwise unremarkable. No free intraperitoneal gas or fluid. Appendix absent. Vascular/Lymphatic: Aortic atherosclerosis. No enlarged abdominal or pelvic lymph nodes. Reproductive: Prostate is unremarkable.  Other: Tiny fat containing umbilical and left inguinal hernias. Rectum unremarkable. Musculoskeletal: No acute bone abnormality. No lytic or blastic bone lesion. IMPRESSION: 1. Periduodenal inflammatory changes involving the second and proximal third portion of the duodenum in keeping with an infectious or inflammatory duodenitis. No evidence of obstruction or perforation. 2. Mild hepatic steatosis. Aortic Atherosclerosis (ICD10-I70.0). Electronically Signed   By: Helyn Numbers M.D.   On: 07/26/2021 03:58    Procedures Procedures    Medications Ordered in ED Medications  morphine (PF) 4 MG/ML injection 4 mg (4 mg Intravenous Given 07/26/21 0241)  ondansetron (ZOFRAN) injection 4 mg (4 mg Intravenous Given 07/26/21 0240)  alum & mag hydroxide-simeth (MAALOX/MYLANTA) 200-200-20 MG/5ML suspension 30 mL (30 mLs Oral Given 07/26/21 0241)    And  lidocaine (XYLOCAINE) 2 % viscous mouth solution 15 mL (15 mLs Oral Given 07/26/21 0241)  iohexol (OMNIPAQUE) 300 MG/ML solution 100 mL (100 mLs Intravenous Contrast Given 07/26/21 0345)  pantoprazole (PROTONIX) injection 40 mg (40 mg Intravenous Given 07/26/21 0506)  sucralfate (CARAFATE) tablet 1 g (  1 g Oral Given 07/26/21 1610)    ED Course/ Medical Decision Making/ A&P                           Medical Decision Making Amount and/or Complexity of Data Reviewed Radiology: ordered.  Risk OTC drugs. Prescription drug management.   This patient presents to the ED for concern of abdominal pain, this involves an extensive number of treatment options, and is a complaint that carries with it a high risk of complications and morbidity.  I considered the following differential and admission for this acute, potentially life threatening condition.  The differential diagnosis includes pancreatitis, gastritis, reflux, cholecystitis, atypical ACS  MDM:    This is a 41 year old male with known medical history of coronary artery disease and pancreatitis who presents  with abdominal pain and back pain.  He is nontoxic and vital signs are reassuring.  He has epigastric tenderness palpation without signs of peritonitis.  Labs obtained.  Normal LFTs.  Normal lipase.  No significant leukocytosis.  This would argue against cholecystitis or pancreatitis.  CT scan obtained given duration of symptoms and ongoing discomfort.  CT scan is suggestive of duodenitis.  Patient does admit that he took Aleve for approximately 1 week prior to onset of symptoms.  We discussed avoiding alcohol and NSAIDs.  Patient ultimately got relief after being given a PPI and a GI cocktail with Carafate.  We will start on omeprazole daily and have him follow-up with gastroenterology.  (Labs, imaging, consults)  Labs: I Ordered, and personally interpreted labs.  The pertinent results include: CBC, CMP, lipase  Imaging Studies ordered: I ordered imaging studies including CT abdomen pelvis I independently visualized and interpreted imaging. I agree with the radiologist interpretation  Additional history obtained from wife at bedside.  External records from outside source obtained and reviewed including prior evaluations and admission  Cardiac Monitoring: The patient was maintained on a cardiac monitor.  I personally viewed and interpreted the cardiac monitored which showed an underlying rhythm of: Normal sinus rhythm  Reevaluation: After the interventions noted above, I reevaluated the patient and found that they have :improved  Social Determinants of Health: Lives independently  Disposition: Discharge  Co morbidities that complicate the patient evaluation  Past Medical History:  Diagnosis Date   Anxiety    Arthritis    "qwhere" (11/14/2017)   At risk for sleep apnea    STOP-BANG= 6     SENT TO PCP 08-05-2014   Chest pain, pleuritic 01/10/2018   Chronic pancreatitis (HCC)    Coronary artery disease    Depression    High triglycerides    History of kidney stones     Hyperlipidemia    Hypertension    "lost wright and this went away" (11/14/2017)   Obesity    Type II diabetes mellitus (HCC)    "dx'd 08/2017" (11/14/2017)   Urethral stricture      Medicines Meds ordered this encounter  Medications   morphine (PF) 4 MG/ML injection 4 mg   ondansetron (ZOFRAN) injection 4 mg   AND Linked Order Group    alum & mag hydroxide-simeth (MAALOX/MYLANTA) 200-200-20 MG/5ML suspension 30 mL    lidocaine (XYLOCAINE) 2 % viscous mouth solution 15 mL   iohexol (OMNIPAQUE) 300 MG/ML solution 100 mL   pantoprazole (PROTONIX) injection 40 mg   sucralfate (CARAFATE) tablet 1 g   omeprazole (PRILOSEC) 20 MG capsule    Sig: Take 1 capsule (20  mg total) by mouth daily.    Dispense:  30 capsule    Refill:  0    I have reviewed the patients home medicines and have made adjustments as needed  Problem List / ED Course: Problem List Items Addressed This Visit   None Visit Diagnoses     Duodenitis    -  Primary                   Final Clinical Impression(s) / ED Diagnoses Final diagnoses:  Duodenitis    Rx / DC Orders ED Discharge Orders          Ordered    omeprazole (PRILOSEC) 20 MG capsule  Daily        07/26/21 0548              Shon Baton, MD 07/26/21 [phone]

## 2021-07-27 ENCOUNTER — Other Ambulatory Visit: Payer: Self-pay | Admitting: Physician Assistant

## 2021-07-27 ENCOUNTER — Telehealth (HOSPITAL_COMMUNITY): Payer: Self-pay | Admitting: *Deleted

## 2021-07-27 DIAGNOSIS — Z951 Presence of aortocoronary bypass graft: Secondary | ICD-10-CM

## 2021-07-27 DIAGNOSIS — R079 Chest pain, unspecified: Secondary | ICD-10-CM

## 2021-07-27 NOTE — Telephone Encounter (Signed)
Patient given detailed instructions per Myocardial Perfusion Study Information Sheet for the test on 07/29/2021 at 7:30. Patient notified to arrive 15 minutes early and that it is imperative to arrive on time for appointment to keep from having the test rescheduled.  If you need to cancel or reschedule your appointment, please call the office within 24 hours of your appointment. . Patient verbalized understanding.Andre Ford

## 2021-07-29 ENCOUNTER — Ambulatory Visit (HOSPITAL_COMMUNITY): Payer: BC Managed Care – PPO | Attending: Internal Medicine

## 2021-07-29 VITALS — Ht 72.0 in | Wt 243.0 lb

## 2021-07-29 DIAGNOSIS — Z951 Presence of aortocoronary bypass graft: Secondary | ICD-10-CM | POA: Insufficient documentation

## 2021-07-29 DIAGNOSIS — E1159 Type 2 diabetes mellitus with other circulatory complications: Secondary | ICD-10-CM | POA: Diagnosis not present

## 2021-07-29 DIAGNOSIS — I208 Other forms of angina pectoris: Secondary | ICD-10-CM | POA: Insufficient documentation

## 2021-07-29 LAB — MYOCARDIAL PERFUSION IMAGING
Angina Index: 0
Duke Treadmill Score: 8
Estimated workload: 10.1
Exercise duration (min): 8 min
Exercise duration (sec): 15 s
LV dias vol: 84 mL (ref 62–150)
LV sys vol: 35 mL
MPHR: 179 {beats}/min
Nuc Stress EF: 58 %
Peak HR: 160 {beats}/min
Percent HR: 89 %
Rest HR: 79 {beats}/min
Rest Nuclear Isotope Dose: 10.2 mCi
SDS: 0
SRS: 0
SSS: 0
ST Depression (mm): 0 mm
Stress Nuclear Isotope Dose: 30.7 mCi
TID: 0.9

## 2021-07-29 MED ORDER — TECHNETIUM TC 99M TETROFOSMIN IV KIT
30.7000 | PACK | Freq: Once | INTRAVENOUS | Status: AC | PRN
  Administered 2021-07-29: 30.7 via INTRAVENOUS

## 2021-07-29 MED ORDER — TECHNETIUM TC 99M TETROFOSMIN IV KIT
10.2000 | PACK | Freq: Once | INTRAVENOUS | Status: AC | PRN
  Administered 2021-07-29: 10.2 via INTRAVENOUS

## 2021-08-15 ENCOUNTER — Ambulatory Visit: Payer: BC Managed Care – PPO | Admitting: Pharmacist

## 2021-08-15 NOTE — Progress Notes (Unsigned)
Patient ID: GAEGE SANGALANG                 DOB: 1980-07-13                    MRN: [id number]     HPI: Andre Ford is a 41 y.o. male patient referred to lipid clinic by Dr. Janee Ford. PMH is significant for CAD s/p CABGx3 (2019), HTN, HLD with hypertriglyceridemia, chroinc pancreatitis, DMT2, obesity, and hypogonadism. Pt presented in 2016 for acute pancreatitis where triglycerides were measured at 837 after an alcohol binge. Triglycerides have been documented as high as 1,344 in 10/2019. Pt experienced exertional back and shoulder pain, was found to have CAD and underwent CABG in 2019. He was readmitted in 12/2017 for chest pain. He ruled out for MI. Echo showed normal EF 50-55% without WMAs, mild RV dysfunction and trivial pericardial effusion. Exercise tolerance test in 2021 showed BP response to exercise within normal limits, excellent exercise capacity, no ST or t-wave inversion deviation with stress, and overall negative findings.   Pt was last seen by Dr. Janee Ford on 07/21/21 where he reported dealing with 6 months of fatigue accompanied by head and back pain that is not exertional. Pt's testosterone replacement therapy was recently stopped for levels above goal, which was believed to be contributing to fatigue. Given pervious atypical presentation for ACS, a stress test was ordered and completed on 07/29/21 where pt was noted to have normal perfusion, no evidence of infarction, an EF of 58%, and normal diastolic function.   Since that visit, pt presented to the ED for abdominal pain, which was diagnosed as duodenitis. Pt was discharged from the ED with a prescription for omeprazole and advised to follow up with GI.  A1c 6.2 in January  Plan: increase fenofibrate to 200 mg daily and Crestor to 40 mg  Current Medications: rosuvastatin 20 mg daily, Vascepa 2g twice daily, and Lofibra 134 mg daily Intolerances: gemfibrozil 600 mg daily Risk Factors: premature ASCVD, HTN, DM2, and obesity LDL  goal: <55  Diet:   Exercise:   Family History:   Social History:   Labs:  Lipid panel 02/21/21 on rosuvastatin 20 mg daily, Vascepa 2g twice daily, and Lofibra 134 mg daily TC 133, HDL 27, LDL direct 33, TG 185  Lipid panel 10/31/19 on Crestor 10 mg daily, Vascepa 4g QD:  TG 1,344, TC 266, HDL 26, LDL direct 49  Past Medical History:  Diagnosis Date   Anxiety    Arthritis    "qwhere" (11/14/2017)   At risk for sleep apnea    STOP-BANG= 6     SENT TO PCP 08-05-2014   Chest pain, pleuritic 01/10/2018   Chronic pancreatitis (HCC)    Coronary artery disease    Depression    High triglycerides    History of kidney stones    Hyperlipidemia    Hypertension    "lost wright and this went away" (11/14/2017)   Obesity    Type II diabetes mellitus (HCC)    "dx'd 08/2017" (11/14/2017)   Urethral stricture     Current Outpatient Medications on File Prior to Visit  Medication Sig Dispense Refill   ALPRAZolam (XANAX) 1 MG tablet Take 1 mg by mouth 2 (two) times daily as needed.     aspirin EC 81 MG tablet Take 1 tablet (81 mg total) by mouth daily. 90 tablet 3   escitalopram (LEXAPRO) 10 MG tablet Take 10 mg by mouth daily.  3  fenofibrate micronized (LOFIBRA) 134 MG capsule Take 1 capsule (134 mg total) by mouth daily. 90 capsule 3   icosapent Ethyl (VASCEPA) 1 g capsule Take 2 capsules (2 g total) by mouth 2 (two) times daily. 360 capsule 3   JARDIANCE 25 MG TABS tablet Take 25 mg by mouth daily.  6   metFORMIN (GLUCOPHAGE) 1000 MG tablet Take 1,000 mg by mouth 2 (two) times daily.     metoprolol tartrate (LOPRESSOR) 50 MG tablet TAKE 1 TABLET BY MOUTH TWICE A DAY 180 tablet 2   omeprazole (PRILOSEC) 20 MG capsule Take 1 capsule (20 mg total) by mouth daily. 30 capsule 0   rosuvastatin (CRESTOR) 20 MG tablet Take 1 tablet (20 mg total) by mouth daily. 90 tablet 3   No current facility-administered medications on file prior to visit.    No Known  Allergies  Assessment/Plan:  1. Hyperlipidemia -

## 2021-09-16 ENCOUNTER — Telehealth: Payer: Self-pay | Admitting: *Deleted

## 2021-09-16 NOTE — Chronic Care Management (AMB) (Unsigned)
  Care Coordination  Outreach Note  09/16/2021 Name: Andre Ford MRN: [id number] DOB: [birth year]   Care Coordination Outreach Attempts  An unsuccessful telephone outreach was attempted today to offer the patient information about available care coordination services as a benefit of their health plan.   Follow Up Plan:  Additional outreach attempts will be made to offer the patient care coordination information and services.   Encounter Outcome:  No Answer  Christie Nottingham  Care Coordination Care Guide  Direct Dial: [phone]

## 2021-09-20 NOTE — Chronic Care Management (AMB) (Signed)
  Care Coordination   Note   09/20/2021 Name: UNDRAY ALLMAN MRN: [id number] DOB: 02-26-1980  Jackquline Denmark Granieri is a 41 y.o. year old male who sees [facility], Jeannett Senior, MD for primary care. I reached out to Lavonia Drafts by phone today to offer care coordination services.  Mr. Justiniano was given information about Care Coordination services today including:   The Care Coordination services include support from the care team which includes your Nurse Coordinator, Clinical Social Worker, or Pharmacist.  The Care Coordination team is here to help remove barriers to the health concerns and goals most important to you. Care Coordination services are voluntary, and the patient may decline or stop services at any time by request to their care team member.   Care Coordination Consent Status: Patient agreed to services and verbal consent obtained.   Follow up plan:  Telephone appointment with care coordination team member scheduled for:  09/27/21  Encounter Outcome:  Pt. Scheduled  [facility] Coordination Care Guide  Direct Dial: [phone]

## 2021-09-27 ENCOUNTER — Ambulatory Visit: Payer: Self-pay

## 2021-09-27 NOTE — Patient Outreach (Signed)
  Care Coordination   Initial Visit Note   09/27/2021 Name: Andre Ford MRN: [id number] DOB: 1980-03-31  Andre Ford is a 41 y.o. year old male who sees [facility], Jeannett Senior, MD for primary care. I spoke with  Andre Ford by phone today.  What matters to the patients health and wellness today?  Patient is happy with his current diabetes management and declines further [facility] CC services at this time.     Goals Addressed      COMPLETED: Care Coordination activities       Care Coordination Interventions: Reviewed medications with patient and discussed importance of medication adherence Advised patient, providing education and rationale, to check cbg daily before breakfast and record, calling PCP for findings outside established parameters Review of patient status, including review of consultants reports, relevant laboratory and other test results, and medications completed Determined patient has lost 35 lbs by following a diabetic diet and his wife helps him with carb counting Determined patient feels happy with his current diabetes management and declines further [facility] care coordination services at this time     SDOH assessments and interventions completed:  No     Care Coordination Interventions Activated:  Yes  Care Coordination Interventions:  Yes, provided   Follow up plan: No further intervention required.   Encounter Outcome:  Pt. Refused

## 2021-09-27 NOTE — Patient Instructions (Signed)
Visit Information  Thank you for taking time to visit with me today. Please don't hesitate to contact me if I can be of assistance to you.   Following are the goals we discussed today:   Goals Addressed      COMPLETED: Care Coordination activities       Care Coordination Interventions: Reviewed medications with patient and discussed importance of medication adherence Advised patient, providing education and rationale, to check cbg daily before breakfast and record, calling PCP for findings outside established parameters Review of patient status, including review of consultants reports, relevant laboratory and other test results, and medications completed Determined patient has lost 35 lbs by following a diabetic diet and his wife helps him with carb counting Determined patient feels happy with his current diabetes management and declines further [facility] care coordination services at this time       If you are experiencing a Mental Health or Behavioral Health Crisis or need someone to talk to, please call 1-800-273-TALK (toll free, 24 hour hotline)  Patient verbalizes understanding of instructions and care plan provided today and agrees to view in MyChart. Active MyChart status and patient understanding of how to access instructions and care plan via MyChart confirmed with patient.     Delsa Sale, RN, BSN, CCM Care Management Coordinator [facility] Care Management Direct Phone: [phone]

## 2022-01-16 ENCOUNTER — Other Ambulatory Visit: Payer: Self-pay | Admitting: Cardiovascular Disease

## 2022-01-16 ENCOUNTER — Ambulatory Visit: Payer: BC Managed Care – PPO | Admitting: Cardiovascular Disease

## 2022-01-16 NOTE — Telephone Encounter (Signed)
Rx refill sent to pharmacy. 

## 2022-01-20 ENCOUNTER — Encounter: Payer: Self-pay | Admitting: Cardiovascular Disease

## 2022-01-20 ENCOUNTER — Ambulatory Visit: Payer: 59 | Attending: Cardiovascular Disease | Admitting: Cardiovascular Disease

## 2022-01-20 VITALS — BP 130/70 | HR 87 | Ht 72.0 in | Wt 244.0 lb

## 2022-01-20 DIAGNOSIS — I10 Essential (primary) hypertension: Secondary | ICD-10-CM

## 2022-01-20 DIAGNOSIS — E782 Mixed hyperlipidemia: Secondary | ICD-10-CM | POA: Diagnosis not present

## 2022-01-20 DIAGNOSIS — I2510 Atherosclerotic heart disease of native coronary artery without angina pectoris: Secondary | ICD-10-CM

## 2022-01-20 NOTE — Patient Instructions (Signed)
Medication Instructions:  Your physician recommends that you continue on your current medications as directed. Please refer to the Current Medication list given to you today.  *If you need a refill on your cardiac medications before your next appointment, please call your pharmacy*   Lab Work: NONE If you have labs (blood work) drawn today and your tests are completely normal, you will receive your results only by: MyChart Message (if you have MyChart) OR A paper copy in the mail If you have any lab test that is abnormal or we need to change your treatment, we will call you to review the results.   Testing/Procedures: NONE   Follow-Up: At [facility], you and your health needs are our priority.  As part of our continuing mission to provide you with exceptional heart care, we have created designated Provider Care Teams.  These Care Teams include your primary Cardiologist (physician) and Advanced Practice Providers (APPs -  Physician Assistants and Nurse Practitioners) who all work together to provide you with the care you need, when you need it.  We recommend signing up for the patient portal called "MyChart".  Sign up information is provided on this After Visit Summary.  MyChart is used to connect with patients for Virtual Visits (Telemedicine).  Patients are able to view lab/test results, encounter notes, upcoming appointments, etc.  Non-urgent messages can be sent to your provider as well.   To learn more about what you can do with MyChart, go to [email or web address].    Your next appointment:   1 year(s)  The format for your next appointment:   In Person  Provider:   Michael Cooper, MD       Important Information About Sugar       

## 2022-01-20 NOTE — Progress Notes (Unsigned)
Cardiology Office Note:    Date:  01/23/2022   ID:  Andre Ford, DOB [birth year], MRN [id number]  PCP:  Adrian Prince, MD   [facility] Providers Cardiologist:  Tonny Bollman, MD     Referring MD: Adrian Prince, MD   Chief Complaint  Patient presents with   Coronary Artery Disease    History of Present Illness:    Andre Ford is a 41 y.o. male with a hx of coronary artery disease status post three-vessel CABG in 2019, hypertension, mixed hyperlipidemia, chronic pancreatitis, type 2 diabetes, and obesity, presenting for follow-up evaluation.  The patient was treated with multivessel CABG using arterial conduits in 2019 with a LIMA to LAD, free RIMA to OM, and left radial to PDA.  The patient is here alone today. He is doing really well.  He has improved his diet and is exercising regularly.  He is followed closely by Dr. Evlyn Kanner who manages his testosterone and diabetes.  Today, he denies symptoms of palpitations, chest pain, shortness of breath, orthopnea, PND, lower extremity edema, dizziness, or syncope.   Past Medical History:  Diagnosis Date   Anxiety    Arthritis    "qwhere" (11/14/2017)   At risk for sleep apnea    STOP-BANG= 6     SENT TO PCP 08-05-2014   Chest pain, pleuritic 01/10/2018   Chronic pancreatitis (HCC)    Coronary artery disease    Depression    High triglycerides    History of kidney stones    Hyperlipidemia    Hypertension    "lost wright and this went away" (11/14/2017)   Obesity    Type II diabetes mellitus (HCC)    "dx'd 08/2017" (11/14/2017)   Urethral stricture     Past Surgical History:  Procedure Laterality Date   APPENDECTOMY  age 66   CARDIAC CATHETERIZATION     CORONARY ARTERY BYPASS GRAFT N/A 11/16/2017   Procedure: CORONARY ARTERY BYPASS GRAFTING (CABG) x 3.;  Surgeon: Loreli Slot, MD;  Location: [facility] OR;  Service: Open Heart Surgery;  Laterality: N/A;  BILATERAL IMAS   CYSTOSCOPY WITH URETHRAL DILATATION  N/A 08/10/2014   Procedure: CYSTOSCOPY WITH URETHRAL DILATATION;  Surgeon: Malen Gauze, MD;  Location[facility];  Service: Urology;  Laterality: N/A;   CYSTOSCOPY WITH URETHRAL DILATATION N/A 11/16/2017   Procedure: CYSTOSCOPY WITH URETHRAL DILATATION;  Surgeon: Loreli Slot, MD;  Location: [facility] OR;  Service: Open Heart Surgery;  Laterality: N/A;  performed by Dr. Ronne Binning   FRACTURE SURGERY     KNEE ARTHROSCOPY WITH ANTERIOR CRUCIATE LIGAMENT (ACL) REPAIR Left 2000   LEFT HEART CATH AND CORONARY ANGIOGRAPHY N/A 11/12/2017   Procedure: LEFT HEART CATH AND CORONARY ANGIOGRAPHY;  Surgeon: Elder Negus, MD;  Location: [facility] INVASIVE CV LAB;  Service: Cardiovascular;  Laterality: N/A;   OPEN REDUCTION INTERNAL FIXATION (ORIF) HAND Right 2000   "boxer fx"   PENILE BIOPSY N/A 08/10/2014   Procedure: Core biopsy of penile glands lesion;  Surgeon: Malen Gauze, MD;  Location: [facility];  Service: Urology;  Laterality: N/A;   RADIAL ARTERY HARVEST Left 11/16/2017   Procedure: RADIAL ARTERY HARVEST;  Surgeon: Loreli Slot, MD;  Location: [facility] OR;  Service: Open Heart Surgery;  Laterality: Left;   TEE WITHOUT CARDIOVERSION N/A 11/16/2017   Procedure: TRANSESOPHAGEAL ECHOCARDIOGRAM (TEE);  Surgeon: Loreli Slot, MD;  Location: [facility] OR;  Service: Open Heart Surgery;  Laterality: N/A;   URETHROPLASTY  2016    Current Medications: Current Meds  Medication Sig   ALPRAZolam (XANAX) 1 MG tablet Take 1 mg by mouth 2 (two) times daily as needed.   aspirin EC 81 MG tablet Take 1 tablet (81 mg total) by mouth daily.   escitalopram (LEXAPRO) 10 MG tablet Take 10 mg by mouth daily.   fenofibrate micronized (LOFIBRA) 134 MG capsule Take 1 capsule (134 mg total) by mouth daily.   ibuprofen (ADVIL) 800 MG tablet as needed.   icosapent Ethyl (VASCEPA) 1 g capsule TAKE 2 CAPSULES BY MOUTH 2 TIMES DAILY.   JARDIANCE 25 MG TABS tablet Take 25 mg by  mouth daily.   metFORMIN (GLUCOPHAGE) 1000 MG tablet Take 1,000 mg by mouth 2 (two) times daily.   metoprolol tartrate (LOPRESSOR) 50 MG tablet TAKE 1 TABLET BY MOUTH TWICE A DAY   omeprazole (PRILOSEC) 20 MG capsule Take 1 capsule (20 mg total) by mouth daily.   rosuvastatin (CRESTOR) 20 MG tablet TAKE 1 TABLET BY MOUTH EVERY DAY     Allergies:   Patient has no known allergies.   Social History   Socioeconomic History   Marital status: Married    Spouse name: Irving Burton   Number of children: 2   Years of education: Not on file   Highest education level: Not on file  Occupational History   Occupation: self employed  Tobacco Use   Smoking status: Former    Packs/day: 1.00    Years: 10.00    Total pack years: 10.00    Types: Cigarettes    Quit date: 08/05/2011    Years since quitting: 10.4   Smokeless tobacco: Former    Types: Snuff  Vaping Use   Vaping Use: Never used  Substance and Sexual Activity   Alcohol use: Yes    Comment: 11/14/2017 "nothing since pancreatitis dx'd in 2016"   Drug use: Not Currently   Sexual activity: Not on file  Other Topics Concern   Not on file  Social History Narrative   Not on file   Social Determinants of Health   Financial Resource Strain: Not on file  Food Insecurity: No Food Insecurity (01/31/2018)   Hunger Vital Sign    Worried About Running Out of Food in the Last Year: Never true    Ran Out of Food in the Last Year: Never true  Transportation Needs: No Transportation Needs (01/31/2018)   PRAPARE - Administrator, Civil Service (Medical): No    Lack of Transportation (Non-Medical): No  Physical Activity: Not on file  Stress: Stress Concern Present (01/31/2018)   [employer] of Occupational Health - Occupational Stress Questionnaire    Feeling of Stress : To some extent  Social Connections: Not on file     Family History: The patient's family history includes CAD in his paternal grandfather; Diabetes type I in his  brother; Leukemia in his father; Lupus in his sister. There is no history of Heart disease.  ROS:   Please see the history of present illness.    All other systems reviewed and are negative.  EKGs/Labs/Other Studies Reviewed:    The following studies were reviewed today: Stress Myoview scan 07/29/2021:   The study is normal. The study is low risk.   No ST deviation was noted.   LV perfusion is normal. There is no evidence of ischemia. There is no evidence of infarction.   Left ventricular function is normal. Nuclear stress EF: 58 %. The left ventricular ejection  fraction is normal (55-65%). End diastolic cavity size is normal.   Prior study not available for comparison.  EKG:  EKG is not ordered today.    Recent Labs: 07/25/2021: ALT 26; BUN 15; Creatinine, Ser 0.80; Hemoglobin 17.3; Platelets 154; Potassium 3.9; Sodium 135  Recent Lipid Panel    Component Value Date/Time   CHOL 266 (H) 10/31/2019 0902   TRIG 1,344 (HH) 10/31/2019 0902   HDL 26 (L) 10/31/2019 0902   CHOLHDL 10.2 (H) 10/31/2019 0902   CHOLHDL 8.9 10/13/2014 0500   VLDL UNABLE TO CALCULATE IF TRIGLYCERIDE OVER 400 mg/dL 29/92/4268 3419   LDLCALC Comment (A) 10/31/2019 0902   LDLDIRECT 49 10/31/2019 0902     Risk Assessment/Calculations:            Physical Exam:    VS:  BP 130/70   Pulse 87   Ht 6' (1.829 m)   Wt 244 lb (110.7 kg)   SpO2 97%   BMI 33.09 kg/m     Wt Readings from Last 3 Encounters:  01/20/22 244 lb (110.7 kg)  07/29/21 243 lb (110.2 kg)  07/25/21 243 lb (110.2 kg)     GEN:  Well nourished, well developed in no acute distress HEENT: Normal NECK: No JVD; No carotid bruits LYMPHATICS: No lymphadenopathy CARDIAC: RRR, no murmurs, rubs, gallops RESPIRATORY:  Clear to auscultation without rales, wheezing or rhonchi  ABDOMEN: Soft, non-tender, non-distended MUSCULOSKELETAL:  No edema; No deformity  SKIN: Warm and dry NEUROLOGIC:  Alert and oriented x 3 PSYCHIATRIC:  Normal affect    ASSESSMENT:    1. Coronary artery disease involving native coronary artery of native heart without angina pectoris   2. Mixed hyperlipidemia   3. Essential hypertension    PLAN:    In order of problems listed above:  The patient is stable with no symptoms of angina.  He is exercising regularly with no exertional symptoms.  He is on good medical regimen which includes aspirin, Jardiance, Vascepa, Lopressor, and rosuvastatin.  He will continue his current medicines and follow-up in 1 year.  His stress Myoview from this summer showed no ischemia with the test outlined above.  This is reviewed today. Lipids with a cholesterol of 155, LDL 41, triglycerides 415.  Treated with fenofibrate, Vascepa, and rosuvastatin. Blood pressure well-controlled on metoprolol.  Medication Adjustments/Labs and Tests Ordered: Current medicines are reviewed at length with the patient today.  Concerns regarding medicines are outlined above.  No orders of the defined types were placed in this encounter.  No orders of the defined types were placed in this encounter.   Patient Instructions  Medication Instructions:  Your physician recommends that you continue on your current medications as directed. Please refer to the Current Medication list given to you today.  *If you need a refill on your cardiac medications before your next appointment, please call your pharmacy*   Lab Work: NONE If you have labs (blood work) drawn today and your tests are completely normal, you will receive your results only by: MyChart Message (if you have MyChart) OR A paper copy in the mail If you have any lab test that is abnormal or we need to change your treatment, we will call you to review the results.   Testing/Procedures: NONE   Follow-Up: At [facility], you and your health needs are our priority.  As part of our continuing mission to provide you with exceptional heart care, we have created designated  Provider Care Teams.  These Care  Teams include your primary Cardiologist (physician) and Advanced Practice Providers (APPs -  Physician Assistants and Nurse Practitioners) who all work together to provide you with the care you need, when you need it.  We recommend signing up for the patient portal called "MyChart".  Sign up information is provided on this After Visit Summary.  MyChart is used to connect with patients for Virtual Visits (Telemedicine).  Patients are able to view lab/test results, encounter notes, upcoming appointments, etc.  Non-urgent messages can be sent to your provider as well.   To learn more about what you can do with MyChart, go to [email or web address].    Your next appointment:   1 year(s)  The format for your next appointment:   In Person  Provider:   Tonny BollmanMichael Idonia Zollinger, MD       Important Information About Sugar         Signed, Tonny BollmanMichael Ariatna Jester, MD  01/23/2022 5:42 PM    [facility]

## 2022-01-23 ENCOUNTER — Encounter: Payer: Self-pay | Admitting: Cardiovascular Disease

## 2022-04-16 ENCOUNTER — Other Ambulatory Visit: Payer: Self-pay | Admitting: Cardiovascular Disease

## 2022-07-12 ENCOUNTER — Other Ambulatory Visit: Payer: Self-pay | Admitting: Cardiovascular Disease

## 2022-07-14 ENCOUNTER — Other Ambulatory Visit: Payer: Self-pay | Admitting: Cardiovascular Disease

## 2022-09-03 ENCOUNTER — Other Ambulatory Visit: Payer: Self-pay | Admitting: Cardiovascular Disease

## 2022-09-04 MED ORDER — ICOSAPENT ETHYL 1 G PO CAPS: 2.0000 g | ORAL_CAPSULE | Freq: Two times a day (BID) | ORAL | 1 refills | Status: DC

## 2022-09-07 ENCOUNTER — Other Ambulatory Visit (HOSPITAL_COMMUNITY): Payer: Self-pay

## 2022-09-07 ENCOUNTER — Telehealth: Payer: Self-pay | Admitting: Pharmacy Technician

## 2022-09-07 NOTE — Telephone Encounter (Signed)
Pharmacy Patient Advocate Encounter   Received notification from CoverMyMeds that prior authorization for Icosapent Ethyl 1GM capsules is required/requested.   Insurance verification completed.   The patient is insured through [facility] .   Per test claim: PA required; PA submitted to [facility] via CoverMyMeds Key/confirmation #/EOC [id number] Status is pending

## 2022-09-18 ENCOUNTER — Other Ambulatory Visit (HOSPITAL_COMMUNITY): Payer: Self-pay

## 2022-09-18 NOTE — Telephone Encounter (Signed)
Pharmacy Patient Advocate Encounter  Received notification from [facility] that Prior Authorization for ICOSAPENT ETHYL 1 GM CAP has been APPROVED from 09/07/22 to 09/07/23. Ran test claim, Copay is $30. This test claim was processed through [facility]- copay amounts may vary at other pharmacies due to pharmacy/plan contracts, or as the patient moves through the different stages of their insurance plan.

## 2022-10-09 ENCOUNTER — Other Ambulatory Visit: Payer: Self-pay | Admitting: Cardiovascular Disease

## 2023-01-10 ENCOUNTER — Other Ambulatory Visit: Payer: Self-pay | Admitting: Cardiovascular Disease

## 2023-03-02 ENCOUNTER — Encounter: Payer: Self-pay | Admitting: Cardiovascular Disease

## 2023-03-02 ENCOUNTER — Ambulatory Visit: Payer: Commercial Managed Care - PPO | Attending: Cardiovascular Disease | Admitting: Cardiovascular Disease

## 2023-03-02 VITALS — BP 110/84 | HR 81 | Ht 71.0 in | Wt 226.6 lb

## 2023-03-02 DIAGNOSIS — I2510 Atherosclerotic heart disease of native coronary artery without angina pectoris: Secondary | ICD-10-CM

## 2023-03-02 DIAGNOSIS — I10 Essential (primary) hypertension: Secondary | ICD-10-CM

## 2023-03-02 DIAGNOSIS — E782 Mixed hyperlipidemia: Secondary | ICD-10-CM

## 2023-03-02 MED ORDER — ROSUVASTATIN CALCIUM 20 MG PO TABS: 20.0000 mg | ORAL_TABLET | Freq: Every day | ORAL | 3 refills | Status: AC

## 2023-03-02 MED ORDER — ICOSAPENT ETHYL 1 G PO CAPS: 2.0000 g | ORAL_CAPSULE | Freq: Two times a day (BID) | ORAL | 1 refills | Status: AC

## 2023-03-02 MED ORDER — METOPROLOL TARTRATE 50 MG PO TABS: 50.0000 mg | ORAL_TABLET | Freq: Two times a day (BID) | ORAL | 3 refills | Status: AC

## 2023-03-02 NOTE — Progress Notes (Signed)
Cardiology Office Note:    Date:  03/02/2023   ID:  VAMSI APFEL, DOB 03/07/1980, MRN [id number]  PCP:  Adrian Prince, MD   [facility] Providers Cardiologist:  Tonny Bollman, MD     Referring MD: Adrian Prince, MD   Chief Complaint  Patient presents with   Coronary Artery Disease    History of Present Illness:    Andre Ford is a 43 y.o. male presenting for follow-up evaluation.  The patient has a history of coronary artery disease status post three-vessel CABG in 2019, hypertension, hyperlipidemia, type 2 diabetes, and obesity.  His CABG was performed with arterial conduits using a LIMA to LAD, free RIMA to OM, and left radial to PDA graft.  The patient's most recent stress Myoview scan in 2023 showed normal LVEF and normal perfusion.  The patient is here alone today.  He is doing very well.  He has continued to exercise regularly and lose weight.  He lifts weights and works out on the elliptical.  He has no exertional symptoms.  The patient specifically denies chest pain, chest pressure, heart palpitations, or shortness of breath.   Current Medications: Current Meds  Medication Sig   ALPRAZolam (XANAX) 1 MG tablet Take 1 mg by mouth 2 (two) times daily as needed.   aspirin EC 81 MG tablet Take 1 tablet (81 mg total) by mouth daily.   escitalopram (LEXAPRO) 10 MG tablet Take 10 mg by mouth daily.   fenofibrate micronized (LOFIBRA) 134 MG capsule TAKE 1 CAPSULE BY MOUTH DAILY.   icosapent Ethyl (VASCEPA) 1 g capsule Take 2 capsules (2 g total) by mouth 2 (two) times daily.   JARDIANCE 25 MG TABS tablet Take 25 mg by mouth daily.   metFORMIN (GLUCOPHAGE) 1000 MG tablet Take 1,000 mg by mouth 2 (two) times daily.   metoprolol tartrate (LOPRESSOR) 50 MG tablet TAKE 1 TABLET BY MOUTH TWICE A DAY   rosuvastatin (CRESTOR) 20 MG tablet TAKE 1 TABLET BY MOUTH EVERY DAY   testosterone cypionate (DEPOTESTOSTERONE CYPIONATE) 200 MG/ML injection Inject 200 mg into the  muscle once a week.   [DISCONTINUED] ibuprofen (ADVIL) 800 MG tablet as needed.   [DISCONTINUED] omeprazole (PRILOSEC) 20 MG capsule Take 1 capsule (20 mg total) by mouth daily.     Allergies:   Patient has no known allergies.   ROS:   Please see the history of present illness.    All other systems reviewed and are negative.  EKGs/Labs/Other Studies Reviewed:    The following studies were reviewed today: Cardiac Studies & Procedures   CARDIAC CATHETERIZATION  CARDIAC CATHETERIZATION 11/12/2017  Narrative LM: Distal calcified 40% stenosis LAD: Prox calcified 40% stenosis Mid 95% stenosis LCx: Mid long 95% stenosis RCA: Prox 95%, mid to distal long diffuse 75% stenosis  LVEDP normal LVEDP 55-60%  Recommendation: Admit to telemetry Continue aspirin/heparin/statin CVTS consult for CABG in view of uncontrolled DM, hypertriglyceridemia and severe multivessel CAD  Manish Emiliano Dyer, MD [facility] [place] Pager: [phone] Office: [phone] If no answer Cell [phone]  Findings Coronary Findings Diagnostic  Dominance: Right  Left Main Vessel is normal in caliber. Dist LM to Prox LAD lesion is 40% stenosed. The lesion is moderately calcified.  Left Anterior Descending Prox LAD to Mid LAD lesion is 95% stenosed.  Left Circumflex Vessel is moderate in size. Prox Cx to Mid Cx lesion is 95% stenosed.  Second Obtuse Marginal Branch Vessel is small in size.  Right Coronary Artery Prox RCA  lesion is 95% stenosed. The lesion is eccentric. Mid RCA to Dist RCA lesion is 75% stenosed.  Intervention  No interventions have been documented.   STRESS TESTS  MYOCARDIAL PERFUSION IMAGING 07/29/2021  Narrative   The study is normal. The study is low risk.   No ST deviation was noted.   LV perfusion is normal. There is no evidence of ischemia. There is no evidence of infarction.   Left ventricular function is normal. Nuclear stress EF: 58 %. The left  ventricular ejection fraction is normal (55-65%). End diastolic cavity size is normal.   Prior study not available for comparison.  ECHOCARDIOGRAM  ECHOCARDIOGRAM COMPLETE 01/11/2018  Narrative *[place]* *[facility]* [address] [place], [place] [address] [phone]  ------------------------------------------------------------------- Transthoracic Echocardiography  Patient:    Andre Ford, Andre Ford MR #:       [id number] Study Date: 01/11/2018 Gender:     M Age:        37 Height:     180.3 cm Weight:     105.1 kg BSA:        2.32 m^2 Pt. Status: Room:       6E24C  ATTENDING    Earney Hamburg, MD SONOGRAPHER  Delcie Roch, RDCS, CCT ADMITTING    Jacklynn Lewis, Brittainy M PERFORMING   [facility], Inpatient  cc:  ------------------------------------------------------------------- LV EF: 50% -   55%  ------------------------------------------------------------------- Indications:      Chest pain 786.51.  ------------------------------------------------------------------- History:   Risk factors:  Diabetes mellitus.  ------------------------------------------------------------------- Study Conclusions  - Left ventricle: The cavity size was normal. There was mild concentric hypertrophy. Systolic function was normal. The estimated ejection fraction was in the range of 50% to 55%. Wall motion was normal; there were no regional wall motion abnormalities. Left ventricular diastolic function parameters were normal. - Ventricular septum: These changes are consistent with a post-thoracotomy state. - Right ventricle: Systolic function was mildly reduced. - Pericardium, extracardiac: A trivial pericardial effusion was identified posterior to the heart.  Impressions:  - Normal LV EF without wall motion abnormalities. Mild reduction in RV function. Trivial posterior pericardial effusion. Echo contrast used to better define  wall motion.  ------------------------------------------------------------------- Labs, prior tests, procedures, and surgery: Coronary artery bypass grafting.  ------------------------------------------------------------------- Study data:  Comparison was made to the study of 11/16/2017.  Study status:  Routine.  Procedure:  The patient reported no pain pre or post test. Transthoracic echocardiography. Image quality was poor. Intravenous contrast (Definity) was administered.  Study completion:  There were no complications.          Transthoracic echocardiography.  M-mode, complete 2D, spectral Doppler, and color Doppler.  Birthdate:  Patient birthdate: May 12, 1980.  Age:  Patient is 43 yr old.  Sex:  Gender: male.    BMI: 32.3 kg/m^2.  Blood pressure:     98/73  Patient status:  Inpatient.  Study date: Study date: 01/11/2018. Study time: 08:12 AM.  Location:  Echo laboratory.  -------------------------------------------------------------------  ------------------------------------------------------------------- Left ventricle:  The cavity size was normal. There was mild concentric hypertrophy. Systolic function was normal. The estimated ejection fraction was in the range of 50% to 55%. Wall motion was normal; there were no regional wall motion abnormalities. The transmitral flow pattern was normal. The deceleration time of the early transmitral flow velocity was normal. The pulmonary vein flow pattern was normal. The tissue Doppler parameters were normal. Left ventricular diastolic function parameters were normal.  ------------------------------------------------------------------- Aortic valve:  Trileaflet; normal thickness leaflets. Mobility was not restricted.  Doppler:  Transvalvular velocity was within the normal range. There was no stenosis. There was no regurgitation.  ------------------------------------------------------------------- Aorta:  Aortic root: The aortic root  was normal in size.  ------------------------------------------------------------------- Mitral valve:   Structurally normal valve.   Mobility was not restricted.  Doppler:  Transvalvular velocity was within the normal range. There was no evidence for stenosis. There was no regurgitation.    Peak gradient (D): 3 mm Hg.  ------------------------------------------------------------------- Left atrium:  The atrium was normal in size.  ------------------------------------------------------------------- Right ventricle:  The cavity size was normal. Wall thickness was normal. Systolic function was mildly reduced.  ------------------------------------------------------------------- Ventricular septum:   Septal motion showed an abnormal early diastolic motion. These changes are consistent with a post-thoracotomy state.  ------------------------------------------------------------------- Pulmonic valve:   Poorly visualized.  The valve appears to be grossly normal.    Doppler:  Transvalvular velocity was within the normal range. There was no evidence for stenosis. There was trivial regurgitation.  ------------------------------------------------------------------- Tricuspid valve:   Structurally normal valve.    Doppler: Transvalvular velocity was within the normal range. There was no regurgitation.  ------------------------------------------------------------------- Pulmonary artery:   The main pulmonary artery was normal-sized. Systolic pressure could not be accurately estimated.  ------------------------------------------------------------------- Right atrium:  The atrium was normal in size.  ------------------------------------------------------------------- Pericardium:  A trivial pericardial effusion was identified posterior to the heart.  ------------------------------------------------------------------- Systemic veins: Inferior vena cava: The vessel was normal in size.  The respirophasic diameter changes were in the normal range (>= 50%), consistent with normal central venous pressure.  ------------------------------------------------------------------- Measurements  Left ventricle                         Value        Reference LV ID, ED, PLAX chordal        (L)     42.3  mm     43 - 52 LV ID, ES, PLAX chordal                30.8  mm     23 - 38 LV fx shortening, PLAX chordal (L)     27    %      >=29 LV PW thickness, ED                    13.2  mm     ---------- IVS/LV PW ratio, ED                    0.85         <=1.3 Stroke volume, 2D                      61    ml     ---------- Stroke volume/bsa, 2D                  26    ml/m^2 ---------- LV e&', lateral                         12.4  cm/s   ---------- LV E/e&', lateral                       7.35         ---------- LV e&', medial  9.57  cm/s   ---------- LV E/e&', medial                        9.53         ---------- LV e&', average                         10.99 cm/s   ---------- LV E/e&', average                       8.3          ----------  Ventricular septum                     Value        Reference IVS thickness, ED                      11.2  mm     ----------  LVOT                                   Value        Reference LVOT ID, S                             23    mm     ---------- LVOT area                              4.15  cm^2   ---------- LVOT peak velocity, S                  83.5  cm/s   ---------- LVOT mean velocity, S                  54.3  cm/s   ---------- LVOT VTI, S                            14.8  cm     ----------  Aorta                                  Value        Reference Aortic root ID, ED                     32    mm     ---------- Ascending aorta ID, A-P, S             28    mm     ----------  Left atrium                            Value        Reference LA ID, A-P, ES                         34    mm     ---------- LA ID/bsa, A-P                          1.46  cm/m^2 <=2.2 LA volume, S                           40.8  ml     ---------- LA volume/bsa, S                       17.6  ml/m^2 ---------- LA volume, ES, 1-p A4C                 36    ml     ---------- LA volume/bsa, ES, 1-p A4C             15.5  ml/m^2 ---------- LA volume, ES, 1-p A2C                 44    ml     ---------- LA volume/bsa, ES, 1-p A2C             18.9  ml/m^2 ----------  Mitral valve                           Value        Reference Mitral E-wave peak velocity            91.2  cm/s   ---------- Mitral A-wave peak velocity            44.1  cm/s   ---------- Mitral deceleration time               158   ms     150 - 230 Mitral peak gradient, D                3     mm Hg  ---------- Mitral E/A ratio, peak                 2.1          ----------  Right atrium                           Value        Reference RA ID, S-I, ES, A4C                    48    mm     34 - 49 RA area, ES, A4C                       15.5  cm^2   8.3 - 19.5 RA volume, ES, A/L                     41.4  ml     ---------- RA volume/bsa, ES, A/L                 17.8  ml/m^2 ----------  Systemic veins                         Value        Reference Estimated CVP                          3     mm Hg  ----------  Right ventricle  Value        Reference TAPSE                                  11.7  mm     ---------- RV s&', lateral, S                      10.4  cm/s   ----------  Legend: (L)  and  (H)  mark values outside specified reference range.  ------------------------------------------------------------------- Prepared and Electronically Authenticated by  Jodelle Red 2019-12-13T11:42:56  TEE  ECHO TEE 11/16/2017  Interpretation Summary  Mitral valve: Mitral valve leaflets appeared structurally normal. The leaflets opened normally without restriction. There was normal coaptation of the leaflets without prolapsing or flail segments. There was  trace mitral insufficiency.  Right ventricle: The right ventricular cavity was normal in size. There was normal right ventricular systolic function. Upon separation from cardiopulmonary bypass, there was initially decreased RV systolic function which improved over the next 15 to 20 minutes and appeared normal at the conclusion of the procedure.  Tricuspid valve: The tricuspid valve appeared structurally normal. The tricuspid annulus was normal in diameter and measured 3.7 cm at end diastole in the four-chamber view and 3.3 cm in the RV inflow outflow view. There was trace tricuspid insufficiency.  Pulmonic valve: The pulmonic valve appeared structurally normal and there was trace tricuspid insufficiency.            EKG:   EKG Interpretation Date/Time:  Friday March 02 2023 11:27:12 EST Ventricular Rate:  81 PR Interval:  192 QRS Duration:  98 QT Interval:  376 QTC Calculation: 436 R Axis:   80  Text Interpretation: Normal sinus rhythm Inferior infarct , age undetermined When compared with ECG of 26-Jul-2021 02:50, PREVIOUS ECG IS PRESENT No significant change was found Confirmed by Tonny Bollman [phone]) on 03/02/2023 11:36:59 AM    Recent Labs: No results found for requested labs within last 365 days.  Recent Lipid Panel    Component Value Date/Time   CHOL 266 (H) 10/31/2019 0902   TRIG 1,344 (HH) 10/31/2019 0902   HDL 26 (L) 10/31/2019 0902   CHOLHDL 10.2 (H) 10/31/2019 0902   CHOLHDL 8.9 10/13/2014 0500   VLDL UNABLE TO CALCULATE IF TRIGLYCERIDE OVER 400 mg/dL 19/14/7829 5621   LDLCALC Comment (A) 10/31/2019 0902   LDLDIRECT 49 10/31/2019 0902                   Physical Exam:    VS:  BP 110/84   Pulse 81   Ht 5\' 11"  (1.803 m)   Wt 226 lb 9.6 oz (102.8 kg)   SpO2 95%   BMI 31.60 kg/m     Wt Readings from Last 3 Encounters:  03/02/23 226 lb 9.6 oz (102.8 kg)  01/20/22 244 lb (110.7 kg)  07/29/21 243 lb (110.2 kg)     GEN:  Well nourished, well developed  in no acute distress HEENT: Normal NECK: No JVD; No carotid bruits LYMPHATICS: No lymphadenopathy CARDIAC: RRR, no murmurs, rubs, gallops RESPIRATORY:  Clear to auscultation without rales, wheezing or rhonchi  ABDOMEN: Soft, non-tender, non-distended MUSCULOSKELETAL:  No edema; No deformity  SKIN: Warm and dry NEUROLOGIC:  Alert and oriented x 3 PSYCHIATRIC:  Normal affect   Assessment & Plan Coronary artery disease involving native coronary artery of native heart without angina pectoris Stable with no symptoms of  angina.  Status post multivessel CABG with arterial conduits.  Continue aspirin and rosuvastatin. Mixed hyperlipidemia Lipids from December 08, 2022 showed a cholesterol of 114, LDL 48, HDL 35.  Continue current management.  Treated with rosuvastatin 20 mg daily.  Triglycerides are elevated at 188 but he is treated with multidrug therapy using a statin drug, fenofibrate, and Vascepa. Essential hypertension Pressures well-controlled on metoprolol.  No changes are made today.  Patient's weight is down 20 pounds.  For follow-up I will see the patient back in 1 year unless problems arise.  He follows regularly with Dr. Evlyn Kanner.          Medication Adjustments/Labs and Tests Ordered: Current medicines are reviewed at length with the patient today.  Concerns regarding medicines are outlined above.  Orders Placed This Encounter  Procedures   EKG 12-Lead   No orders of the defined types were placed in this encounter.   There are no Patient Instructions on file for this visit.   Signed, Tonny Bollman, MD  03/02/2023 11:55 AM    [facility]

## 2023-03-02 NOTE — Assessment & Plan Note (Signed)
Stable with no symptoms of angina.  Status post multivessel CABG with arterial conduits.  Continue aspirin and rosuvastatin.

## 2023-05-14 ENCOUNTER — Other Ambulatory Visit: Payer: Self-pay | Admitting: Cardiovascular Disease
